# Patient Record
Sex: Female | Born: 1950 | Race: White | Hispanic: No | Smoking: Never smoker
Health system: Southern US, Community
[De-identification: ages and names within clinical notes are randomized; demographics above are authoritative.]

## PROBLEM LIST (undated history)

## (undated) DIAGNOSIS — I00 Rheumatic fever without heart involvement: Secondary | ICD-10-CM

## (undated) DIAGNOSIS — K589 Irritable bowel syndrome without diarrhea: Secondary | ICD-10-CM

## (undated) DIAGNOSIS — R5382 Chronic fatigue, unspecified: Secondary | ICD-10-CM

## (undated) DIAGNOSIS — F32A Depression, unspecified: Secondary | ICD-10-CM

## (undated) DIAGNOSIS — IMO0002 Reserved for concepts with insufficient information to code with codable children: Secondary | ICD-10-CM

## (undated) DIAGNOSIS — F329 Major depressive disorder, single episode, unspecified: Secondary | ICD-10-CM

## (undated) DIAGNOSIS — M1990 Unspecified osteoarthritis, unspecified site: Secondary | ICD-10-CM

## (undated) DIAGNOSIS — N3010 Interstitial cystitis (chronic) without hematuria: Secondary | ICD-10-CM

## (undated) DIAGNOSIS — M797 Fibromyalgia: Secondary | ICD-10-CM

## (undated) DIAGNOSIS — K219 Gastro-esophageal reflux disease without esophagitis: Secondary | ICD-10-CM

## (undated) DIAGNOSIS — I10 Essential (primary) hypertension: Secondary | ICD-10-CM

## (undated) DIAGNOSIS — R42 Dizziness and giddiness: Secondary | ICD-10-CM

## (undated) HISTORY — PX: CHOLECYSTECTOMY: SHX55

## (undated) HISTORY — PX: ABDOMINAL HYSTERECTOMY: SHX81

## (undated) HISTORY — PX: KNEE ARTHROPLASTY: SHX992

## (undated) HISTORY — DX: Major depressive disorder, single episode, unspecified: F32.9

## (undated) HISTORY — PX: OOPHORECTOMY: SHX86

## (undated) HISTORY — DX: Depression, unspecified: F32.A

## (undated) HISTORY — PX: CARPAL TUNNEL RELEASE: SHX101

## (undated) HISTORY — PX: WRIST ARTHROPLASTY: SHX1088

---

## 1997-10-31 ENCOUNTER — Ambulatory Visit (HOSPITAL_COMMUNITY): Admission: RE | Admit: 1997-10-31 | Discharge: 1997-10-31 | Payer: Self-pay | Admitting: Gastroenterology

## 1999-01-06 ENCOUNTER — Other Ambulatory Visit: Admission: RE | Admit: 1999-01-06 | Discharge: 1999-01-06 | Payer: Self-pay | Admitting: Obstetrics & Gynecology

## 1999-12-02 ENCOUNTER — Ambulatory Visit (HOSPITAL_COMMUNITY): Admission: RE | Admit: 1999-12-02 | Discharge: 1999-12-02 | Payer: Self-pay | Admitting: Gastroenterology

## 2000-10-27 ENCOUNTER — Emergency Department (HOSPITAL_COMMUNITY): Admission: EM | Admit: 2000-10-27 | Discharge: 2000-10-27 | Payer: Self-pay | Admitting: Emergency Medicine

## 2000-10-31 ENCOUNTER — Emergency Department (HOSPITAL_COMMUNITY): Admission: EM | Admit: 2000-10-31 | Discharge: 2000-10-31 | Payer: Self-pay | Admitting: Emergency Medicine

## 2000-10-31 ENCOUNTER — Encounter: Payer: Self-pay | Admitting: Emergency Medicine

## 2000-11-05 ENCOUNTER — Emergency Department (HOSPITAL_COMMUNITY): Admission: EM | Admit: 2000-11-05 | Discharge: 2000-11-05 | Payer: Self-pay | Admitting: Emergency Medicine

## 2000-12-06 ENCOUNTER — Emergency Department (HOSPITAL_COMMUNITY): Admission: EM | Admit: 2000-12-06 | Discharge: 2000-12-06 | Payer: Self-pay | Admitting: Emergency Medicine

## 2000-12-08 ENCOUNTER — Ambulatory Visit (HOSPITAL_COMMUNITY): Admission: RE | Admit: 2000-12-08 | Discharge: 2000-12-08 | Payer: Self-pay | Admitting: Internal Medicine

## 2000-12-12 ENCOUNTER — Encounter: Admission: RE | Admit: 2000-12-12 | Discharge: 2001-03-12 | Payer: Self-pay | Admitting: Internal Medicine

## 2001-06-03 ENCOUNTER — Emergency Department (HOSPITAL_COMMUNITY): Admission: EM | Admit: 2001-06-03 | Discharge: 2001-06-03 | Payer: Self-pay | Admitting: Emergency Medicine

## 2001-06-03 ENCOUNTER — Encounter: Payer: Self-pay | Admitting: Family Medicine

## 2001-07-11 ENCOUNTER — Encounter: Payer: Self-pay | Admitting: Obstetrics and Gynecology

## 2001-07-11 ENCOUNTER — Ambulatory Visit (HOSPITAL_COMMUNITY): Admission: RE | Admit: 2001-07-11 | Discharge: 2001-07-11 | Payer: Self-pay | Admitting: Obstetrics and Gynecology

## 2001-10-30 ENCOUNTER — Ambulatory Visit (HOSPITAL_COMMUNITY): Admission: RE | Admit: 2001-10-30 | Discharge: 2001-10-30 | Payer: Self-pay | Admitting: Cardiology

## 2001-12-25 ENCOUNTER — Ambulatory Visit (HOSPITAL_COMMUNITY): Admission: RE | Admit: 2001-12-25 | Discharge: 2001-12-25 | Payer: Self-pay | Admitting: Neurology

## 2001-12-25 ENCOUNTER — Encounter: Payer: Self-pay | Admitting: Neurology

## 2002-01-02 ENCOUNTER — Encounter (HOSPITAL_COMMUNITY): Admission: RE | Admit: 2002-01-02 | Discharge: 2002-02-01 | Payer: Self-pay | Admitting: Neurology

## 2002-01-18 ENCOUNTER — Ambulatory Visit (HOSPITAL_COMMUNITY): Admission: RE | Admit: 2002-01-18 | Discharge: 2002-01-18 | Payer: Self-pay | Admitting: Orthopedic Surgery

## 2002-01-18 ENCOUNTER — Encounter: Payer: Self-pay | Admitting: Orthopedic Surgery

## 2002-01-22 ENCOUNTER — Encounter: Payer: Self-pay | Admitting: Orthopedic Surgery

## 2002-01-22 ENCOUNTER — Ambulatory Visit (HOSPITAL_COMMUNITY): Admission: RE | Admit: 2002-01-22 | Discharge: 2002-01-22 | Payer: Self-pay | Admitting: Orthopedic Surgery

## 2002-03-29 ENCOUNTER — Encounter (INDEPENDENT_AMBULATORY_CARE_PROVIDER_SITE_OTHER): Payer: Self-pay | Admitting: Internal Medicine

## 2002-03-29 ENCOUNTER — Ambulatory Visit (HOSPITAL_COMMUNITY): Admission: RE | Admit: 2002-03-29 | Discharge: 2002-03-29 | Payer: Self-pay | Admitting: Internal Medicine

## 2002-04-07 ENCOUNTER — Emergency Department (HOSPITAL_COMMUNITY): Admission: EM | Admit: 2002-04-07 | Discharge: 2002-04-07 | Payer: Self-pay | Admitting: Emergency Medicine

## 2002-07-25 ENCOUNTER — Encounter (INDEPENDENT_AMBULATORY_CARE_PROVIDER_SITE_OTHER): Payer: Self-pay | Admitting: Internal Medicine

## 2003-07-01 ENCOUNTER — Ambulatory Visit (HOSPITAL_COMMUNITY): Admission: RE | Admit: 2003-07-01 | Discharge: 2003-07-01 | Payer: Self-pay | Admitting: Otolaryngology

## 2003-09-03 ENCOUNTER — Inpatient Hospital Stay (HOSPITAL_COMMUNITY): Admission: EM | Admit: 2003-09-03 | Discharge: 2003-09-04 | Payer: Self-pay | Admitting: Psychiatry

## 2004-08-23 ENCOUNTER — Ambulatory Visit: Payer: Self-pay | Admitting: Gastroenterology

## 2004-08-23 ENCOUNTER — Inpatient Hospital Stay (HOSPITAL_COMMUNITY): Admission: EM | Admit: 2004-08-23 | Discharge: 2004-08-26 | Payer: Self-pay | Admitting: Emergency Medicine

## 2005-08-24 ENCOUNTER — Ambulatory Visit: Payer: Self-pay | Admitting: Internal Medicine

## 2005-09-15 ENCOUNTER — Ambulatory Visit: Payer: Self-pay | Admitting: Internal Medicine

## 2005-09-23 ENCOUNTER — Ambulatory Visit: Payer: Self-pay | Admitting: Family Medicine

## 2005-10-05 ENCOUNTER — Ambulatory Visit: Payer: Self-pay | Admitting: Family Medicine

## 2005-10-05 LAB — CONVERTED CEMR LAB
RBC count: 4.86 10*6/uL
TSH: 0.541 microintl units/mL
WBC, blood: 9.8 10*3/uL

## 2005-12-20 ENCOUNTER — Emergency Department (HOSPITAL_COMMUNITY): Admission: EM | Admit: 2005-12-20 | Discharge: 2005-12-20 | Payer: Self-pay | Admitting: Emergency Medicine

## 2006-01-09 ENCOUNTER — Observation Stay (HOSPITAL_COMMUNITY): Admission: EM | Admit: 2006-01-09 | Discharge: 2006-01-10 | Payer: Self-pay | Admitting: Emergency Medicine

## 2006-01-11 ENCOUNTER — Ambulatory Visit: Payer: Self-pay | Admitting: Internal Medicine

## 2006-01-19 ENCOUNTER — Ambulatory Visit: Payer: Self-pay | Admitting: Internal Medicine

## 2006-03-03 ENCOUNTER — Ambulatory Visit: Payer: Self-pay | Admitting: Internal Medicine

## 2006-05-17 ENCOUNTER — Ambulatory Visit (HOSPITAL_COMMUNITY): Admission: RE | Admit: 2006-05-17 | Discharge: 2006-05-17 | Payer: Self-pay | Admitting: Family Medicine

## 2006-08-03 ENCOUNTER — Emergency Department (HOSPITAL_COMMUNITY): Admission: EM | Admit: 2006-08-03 | Discharge: 2006-08-03 | Payer: Self-pay | Admitting: Emergency Medicine

## 2006-08-22 ENCOUNTER — Telehealth (INDEPENDENT_AMBULATORY_CARE_PROVIDER_SITE_OTHER): Payer: Self-pay | Admitting: Family Medicine

## 2006-08-22 ENCOUNTER — Ambulatory Visit: Payer: Self-pay | Admitting: Family Medicine

## 2006-08-22 ENCOUNTER — Encounter: Payer: Self-pay | Admitting: Family Medicine

## 2006-08-22 DIAGNOSIS — K589 Irritable bowel syndrome without diarrhea: Secondary | ICD-10-CM | POA: Insufficient documentation

## 2006-08-22 DIAGNOSIS — K5900 Constipation, unspecified: Secondary | ICD-10-CM | POA: Insufficient documentation

## 2006-08-22 DIAGNOSIS — Z860100 Personal history of colon polyps, unspecified: Secondary | ICD-10-CM | POA: Insufficient documentation

## 2006-08-22 DIAGNOSIS — G609 Hereditary and idiopathic neuropathy, unspecified: Secondary | ICD-10-CM | POA: Insufficient documentation

## 2006-08-22 DIAGNOSIS — I10 Essential (primary) hypertension: Secondary | ICD-10-CM | POA: Insufficient documentation

## 2006-08-22 DIAGNOSIS — F329 Major depressive disorder, single episode, unspecified: Secondary | ICD-10-CM

## 2006-08-22 DIAGNOSIS — IMO0001 Reserved for inherently not codable concepts without codable children: Secondary | ICD-10-CM | POA: Insufficient documentation

## 2006-08-22 DIAGNOSIS — N318 Other neuromuscular dysfunction of bladder: Secondary | ICD-10-CM | POA: Insufficient documentation

## 2006-08-22 DIAGNOSIS — M1990 Unspecified osteoarthritis, unspecified site: Secondary | ICD-10-CM | POA: Insufficient documentation

## 2006-08-22 DIAGNOSIS — K219 Gastro-esophageal reflux disease without esophagitis: Secondary | ICD-10-CM | POA: Insufficient documentation

## 2006-08-22 DIAGNOSIS — G5600 Carpal tunnel syndrome, unspecified upper limb: Secondary | ICD-10-CM

## 2006-08-22 DIAGNOSIS — G43909 Migraine, unspecified, not intractable, without status migrainosus: Secondary | ICD-10-CM | POA: Insufficient documentation

## 2006-08-22 DIAGNOSIS — F411 Generalized anxiety disorder: Secondary | ICD-10-CM | POA: Insufficient documentation

## 2006-08-22 DIAGNOSIS — M545 Low back pain: Secondary | ICD-10-CM

## 2006-08-22 DIAGNOSIS — N390 Urinary tract infection, site not specified: Secondary | ICD-10-CM

## 2006-09-22 ENCOUNTER — Ambulatory Visit: Payer: Self-pay | Admitting: Family Medicine

## 2006-09-23 ENCOUNTER — Telehealth (INDEPENDENT_AMBULATORY_CARE_PROVIDER_SITE_OTHER): Payer: Self-pay | Admitting: *Deleted

## 2006-09-26 ENCOUNTER — Telehealth (INDEPENDENT_AMBULATORY_CARE_PROVIDER_SITE_OTHER): Payer: Self-pay | Admitting: Family Medicine

## 2006-12-29 ENCOUNTER — Telehealth (INDEPENDENT_AMBULATORY_CARE_PROVIDER_SITE_OTHER): Payer: Self-pay | Admitting: Family Medicine

## 2007-01-03 ENCOUNTER — Ambulatory Visit: Payer: Self-pay | Admitting: Internal Medicine

## 2007-01-04 ENCOUNTER — Telehealth (INDEPENDENT_AMBULATORY_CARE_PROVIDER_SITE_OTHER): Payer: Self-pay | Admitting: *Deleted

## 2007-01-23 ENCOUNTER — Telehealth (INDEPENDENT_AMBULATORY_CARE_PROVIDER_SITE_OTHER): Payer: Self-pay | Admitting: Family Medicine

## 2007-01-24 ENCOUNTER — Ambulatory Visit: Payer: Self-pay | Admitting: Family Medicine

## 2007-01-24 DIAGNOSIS — R5383 Other fatigue: Secondary | ICD-10-CM

## 2007-01-24 DIAGNOSIS — R5381 Other malaise: Secondary | ICD-10-CM

## 2007-01-25 ENCOUNTER — Telehealth (INDEPENDENT_AMBULATORY_CARE_PROVIDER_SITE_OTHER): Payer: Self-pay | Admitting: Family Medicine

## 2007-02-16 ENCOUNTER — Telehealth (INDEPENDENT_AMBULATORY_CARE_PROVIDER_SITE_OTHER): Payer: Self-pay | Admitting: *Deleted

## 2007-02-20 ENCOUNTER — Encounter (INDEPENDENT_AMBULATORY_CARE_PROVIDER_SITE_OTHER): Payer: Self-pay | Admitting: *Deleted

## 2007-02-27 ENCOUNTER — Encounter (INDEPENDENT_AMBULATORY_CARE_PROVIDER_SITE_OTHER): Payer: Self-pay | Admitting: Family Medicine

## 2007-03-07 ENCOUNTER — Encounter (INDEPENDENT_AMBULATORY_CARE_PROVIDER_SITE_OTHER): Payer: Self-pay | Admitting: Family Medicine

## 2007-07-27 ENCOUNTER — Encounter (INDEPENDENT_AMBULATORY_CARE_PROVIDER_SITE_OTHER): Payer: Self-pay | Admitting: Family Medicine

## 2007-11-27 ENCOUNTER — Emergency Department (HOSPITAL_COMMUNITY): Admission: EM | Admit: 2007-11-27 | Discharge: 2007-11-27 | Payer: Self-pay | Admitting: Emergency Medicine

## 2007-12-24 ENCOUNTER — Emergency Department (HOSPITAL_COMMUNITY): Admission: EM | Admit: 2007-12-24 | Discharge: 2007-12-24 | Payer: Self-pay | Admitting: Emergency Medicine

## 2010-07-04 ENCOUNTER — Encounter: Payer: Self-pay | Admitting: Otolaryngology

## 2010-10-30 NOTE — Procedures (Signed)
[facility]  Patient:    Pamela Vasquez, Pamela Vasquez                      MRN: [id number] Proc. Date: 12/02/99 Adm. Date:  [id number] Disc. Date: [id number] Attending:  Charna Elizabeth                           Procedure Report  DATE OF BIRTH:  January 12, 1951.  REFERRING PHYSICIAN:  Dr. Molly Maduro _____.  PROCEDURE PERFORMED:  Colonoscopy.  ENDOSCOPIST:  Anselmo Rod, M.D.  INSTRUMENT USED:  Olympus video colonoscope.  INDICATION FOR PROCEDURE:  Rectal bleeding in a 60 year old white female. Rule out polyps, masses, hemorrhoids, etc.  PREPROCEDURE PREPARATION:  Informed consent was procured from the patient. The patient was fasted for eight hours prior to the procedure, was prepped with a bottle of magnesium citrate and a gallon of NuLytely the night prior to the procedure.  PREPROCEDURE PHYSICAL:  VITAL SIGNS:  The patient had stable vital signs.  NECK:  Supple.  CHEST:  Clear to auscultation.  S1, S2 regular.  ABDOMEN:  Soft with normal abdominal bowel sounds.  DESCRIPTION OF PROCEDURE:  The patient was placed in the left lateral decubitus position and sedated with 2.5 mm of Versed in addition to the Versed she received for the EGD.  Once the patient was adequately sedate, the Olympus video colonoscope was advanced from the rectum to the cecum without difficulties.  Except for a few (about four to five) diverticula noted in the left colon, no other abnormalities were seen.  The patient tolerated the procedure well without complications.  IMPRESSION:  Normal colonoscopy except for few left-sided diverticula.  RECOMMENDATIONS: 1. The patient has been advised to increase the fluid and fiber in her diet. 2. A small bowel follow-through will be done to further work up her symptoms. 3. Outpatient follow-up is advised after the small bowel study. DD:  12/02/99 TD:  12/05/99 Job: [id number]

## 2010-10-30 NOTE — H&P (Signed)
Pamela Vasquez, Pamela Vasquez               ACCOUNT NO.:  [id number]   MEDICAL RECORD NO.:  [id number]          PATIENT TYPE:  INP   LOCATION:  A332                          FACILITY:  [facility]   PHYSICIAN:  Patrica Duel, M.D.    DATE OF BIRTH:  02/12/1951   DATE OF ADMISSION:  08/23/2004  DATE OF DISCHARGE:  LH                                HISTORY & PHYSICAL   CHIEF COMPLAINT:  Bloody diarrhea.   HISTORY OF PRESENT ILLNESS:  This is a 60 year old female with a history of  severe fibromyalgia and depression.  She also has a history of irritable  bowel syndrome and diverticulosis as well as colitis.  She is status post  cholecystectomy, hysterectomy and appendectomy.  She was admitted to the  [facility] approximately 10 months ago for an  overnight stay with a discharge diagnosis of depressive disorder.  She has  been intolerant to most medications prescribed to her.   The patient presented to the emergency department with a two to three week  history of increasingly severe left lower-quadrant abdominal pain and acute  onset of bloody mucoid diarrhea.  Her abdominal pain is currently more of a  cramping quality.  She has had no fevers, chills, nausea, or vomiting noted.  She also denies headache, neurologic deficits, chest pain, shortness of  breath, or genitourinary symptoms.  She does report increasingly severe  urinary retention since being admitted to the hospital.   In the emergency department, routine laboratory was unrevealing except for a  white count of 12,000.  H&H normal.  Potassium 3.4, renal functions as well  as urinalysis normal.   CT was ordered.  However, the patient refused the oral contrast due to  intolerance.   The patient is admitted with the acute onset of bloody mucoid diarrheal  stools after a two or three week history of vague left lower-quadrant  abdominal pain.   CURRENT MEDICATIONS:  1.  Lorazepam 1 mg q.h.s.  2.  Xanax 0.5 p.r.n.  3.  Prilosec 20 mg daily.  4.  Estrogen 0.3 mg daily.   PAST MEDICAL HISTORY:  As noted above.  She also has longstanding  interstitial cystitis and has been intolerant to bladder irrigations.  She  has been followed by [facility] urologic services.   ALLERGIES:  Anti-inflammatories.   SOCIAL HISTORY:  Nonsmoker, nondrinker, has a supportive husband.   REVIEW OF SYSTEMS:  Negative except as mentioned.   PHYSICAL EXAMINATION:  GENERAL:  This is an obviously-depressed, somewhat  pale female who is alert, oriented and in no acute distress.  She appears  well hydrated.  VITAL SIGNS:  Temperature 99 degrees, BP 138/75.  Pulse 78.  Respirations  20.  HEENT:  Normocephalic, atraumatic.  Pupils are equal.  Ears, nose, throat  are benign.  Mucous membranes are moist.  NECK:  Supple, no bruits, thyromegaly or lymphadenopathy.  LUNGS:  Clear.  HEART:  Sounds are perfectly normal.  ABDOMEN:  Flat and nontympanitic.  Bowel sounds are somewhat diminished.  She has left lower-quadrant tenderness which is somewhat inconsistent.  She  has no significant guarding or rebound.  Bowel sounds as noted.  NEUROLOGIC:  Exam is nonfocal.   ASSESSMENT:  Acute onset diarrhea, questionable etiology.  Of note, the  patient has been on antibiotics the last being in January of this year  (Levaquin).  Consider acute colitis, secondary to Clostridium difficile or  inflammatory bowel disease as well as viral syndrome.   PLAN:  Admit for hydration, symptom control, stools C&S, O&P and cultures.  I will ask GI to see the patient.  A CT scan again has been refused, and we  will attempt to manage her without this empirically pending her  reconsideration regarding this matter.  We will follow with you expectantly.      MC/MEDQ  D:  08/23/2004  T:  08/23/2004  Job:  [id number]

## 2010-10-30 NOTE — H&P (Signed)
NAMEMERRANDA, BOLLS               ACCOUNT NO.:  [id number]   MEDICAL RECORD NO.:  [id number]          PATIENT TYPE:  INP   LOCATION:  A319                          FACILITY:  [facility]   PHYSICIAN:  Patrica Duel, M.D.    DATE OF BIRTH:  06-10-1951   DATE OF ADMISSION:  01/09/2006  DATE OF DISCHARGE:  LH                                HISTORY & PHYSICAL   COMBINED HISTORY AND PHYSICAL/DISCHARGE NOTE   CHIEF COMPLAINT:  Abdominal pain.   HISTORY OF PRESENT ILLNESS:  This is a 60 year old female with a history of  severe depression and chronic debility secondary to fibromyalgia.  She says  she stays in bed approximately 75% of the time.  She also has a history of  recurring abdominal pain and documented irritable bowel syndrome as well as  diverticulosis.  She has undergone colonoscopy x2, the last being in 2001.  She was admitted in March 2006 after having had an episode of bloody  diarrhea with benign workup.  She is status post cholecystectomy,  hysterectomy and appendectomy and has chronic fatigue syndrome.   The patient presented to the emergency department approximately 2 weeks ago  with abdominal pain.  Workup was benign.  Chemistries and CT scan were  obtained.  She was treated symptomatically and discharged.   The patient developed left lower quadrant abdominal pain and non-bloody  diarrhea 24 hours prior to this admission.  This had resolved.  Her  abdominal pain continued.  She was brought to the emergency department for  evaluation.  The workup again was  essentially benign.  The CT scan revealed  diverticulosis and non-obstructing renal stones, otherwise no evidence of  inflammation or diverticulitis.  White count normal.  She insisted on  admission because she was just too weak to go home.   There is no history of headache, neurologic deficits, chest pain, shortness  of breath, cough, fever, chills, nausea, vomiting, melena, hematemesis,  hematochezia, genitourinary  symptoms.   ALLERGIES:  ANTI-INFLAMMATORIES, DEMEROL AND CODEINE.   CURRENT MEDICATIONS:  1. Tylenol p.r.n.  2. Prilosec p.r.n.  3. Ativan and Xanax p.r.n.  4. Estrogen patch.   PAST HISTORY:  Noted.  There is a question of interstitial cystitis though  currently she is asymptomatic.   SOCIAL HISTORY:  Nonsmoker, nondrinker.  No history of drug abuse.   FAMILY HISTORY:  Significant for coronary artery disease and hypertension.   REVIEW OF SYMPTOMS:  Negative except as mentioned.   PHYSICAL EXAMINATION:  GENERAL:  This is a pleasant soft-spoken and  outwardly depressed female, who is in no acute distress whatsoever.  VITAL SIGNS:  Normal.  She is afebrile.  HEENT:  Normocephalic and atraumatic.  Pupils are equal.  Ears, nose and  throat are benign.  NECK:  Supple without masses, thyromegaly, lymphadenopathy or bruits.  LUNGS:  Clear to A&P.  HEART:  Heart sounds are normal.  ABDOMEN:  Nontender, nondistended.  Bowel sounds are intact.  There is no  discrete tenderness.  No mass is noted.  EXTREMITIES:  No clubbing, cyanosis or edema.  NEUROLOGIC EXAM:  Nonfocal.  ADMITTING DIAGNOSIS:  Abdominal pain probably related to irritable bowel  syndrome.   COURSE IN THE HOSPITAL:  The patient was hydrated and treated  symptomatically.  At this time, she is asymptomatic and requesting a regular  diet and  discharged home to be worked up as an outpatient.  GI consultation  was offered though this is declined at this time.  Her physical exam is  completely benign.  Her vital signs remain stable and she is okay for  discharge from my perspective.   DISPOSITION:  Medications will include:  1. Xanax 0.5 mg t.i.d. p.r.n.  2. Prilosec 20 mg daily.  3. Levsin p.r.n.   DIET:  As tolerated.   An appointment with Dr. Karilyn Cota or Dr. Jena Gauss will be scheduled.  She will be  followed and treated expectantly as an outpatient.      Patrica Duel, M.D.  Electronically Signed     MC/MEDQ   D:  01/10/2006  T:  01/10/2006  Job:  [id number]

## 2010-10-30 NOTE — Discharge Summary (Signed)
NAME:  Pamela Vasquez, Pamela Vasquez                         ACCOUNT NO.:  [id number]   MEDICAL RECORD NO.:  [id number]                   PATIENT TYPE:  IPS   LOCATION:  0303                                 FACILITY:  [facility]   PHYSICIAN:  Jeanice Lim, M.D.              DATE OF BIRTH:  1950/11/06   DATE OF ADMISSION:  09/03/2003  DATE OF DISCHARGE:  09/04/2003                                 DISCHARGE SUMMARY   IDENTIFYING DATA:  This is a 60 year old married female sent here for  evaluation by her primary care physician, Dr. Timoteo Expose, in [place].  The  patient reported that she thought she was coming to a pain clinic.  Felt  that she was tricked in coming here, although did admit reporting that she  felt that she did not want to wake up because of the pain and felt that she  could not keep going on because of the pain.   MEDICATIONS:  Currently, Prilosec, Tylenol, Xanax and Ativan.   ALLERGIES:  No known drug allergies.   PHYSICAL EXAMINATION:  Within normal limits.  Neurologically nonfocal.   LABORATORY DATA:  Routine admission labs essentially within normal limits.   MENTAL STATUS EXAM:  Alert and oriented x3.  Well-groomed.  Appropriately  dressed.  Speech within normal limits.  Mood anxious.  Affect congruent.  Thought processes goal directed.  Thought content negative for dangerous  ideation.  No psychotic symptoms.  The patient reported that stress appeared  to be worse in the objective findings.  Judgment and insight were fair to  poor.  Cognition intact.  The patient endorsed some depressive symptoms and  fearfulness of the medications.   ADMISSION DIAGNOSES:   AXIS I:  Depressive disorder.   AXIS II:  Deferred.   AXIS III:  1. Irritable bowel syndrome.  2. Fibromyalgia.  3. Chronic fatigue syndrome.   AXIS IV:  Moderate (limited support system and chronic pain).   AXIS V:  25/55.   HOSPITAL COURSE:  The patient was admitted and ordered routine p.r.n.  medications and  underwent further monitoring.  Was encouraged to participate  in individual, group and milieu therapy.  Was encouraged to participate in  individual, group and milieu therapy.  The patient reported that her husband  and she felt that it was a great mistake for her to come here.  They both  had believed it was a pain clinic and that both agreed it was not a good fit  for her condition.  The patient was seen the following day, reported no  suicidal or homicidal ideation, reported having a pain syndrome, which was  severe, and that she had said she could not keep living with the pain.  However, the intent was to get treatment for the pain, not kill herself.  No  history of suicide attempts nor any current suicidal ideation.  Mild  depressive symptoms.  Difficulty sleeping.  Anxiety.  The patient was to  continue medications Flexeril, Ambien, Neurontin, Celexa low dose 5 mg due  to past sensitivity, and Ativan and to follow up with pain referral.   CONDITION ON DISCHARGE:  The patient was discharged in improved condition.  Mood mostly euthymic.  Affect full.  Thought processes goal directed.  Thought content negative for dangerous ideation or psychotic symptoms.  The  patient was given medication education and discharged to continue previous  medications prescribed in addition to:   DISCHARGE MEDICATIONS:  1. Celexa 10 mg, 1/2 q.a.m.  2. Ambien 10 mg q.h.s. p.r.n.  3. Ativan 0.5 mg, 1/2 q.a.m., 1/2 at 4 p.m. and 3 q.h.s.  4. Flexeril 10 mg, 1/2-1 q.h.s.  5. Ultram 50 mg, 2 q.h.s.   FOLLOWUP:  The patient is to follow up with Dr. Andee Poles and for the  pain rehab medicine unit.   DISCHARGE DIAGNOSES:   AXIS I:  Depressive disorder.   AXIS II:  Deferred.   AXIS III:  1. Irritable bowel syndrome.  2. Fibromyalgia.  3. Chronic fatigue syndrome.   AXIS IV:  Moderate (limited support system and chronic pain).   AXIS V:  Global Assessment of Functioning on discharge 55.                                                Jeanice Lim, M.D.    JEM/MEDQ  D:  10/23/2003  T:  10/24/2003  Job:  [id number]

## 2010-10-30 NOTE — Procedures (Signed)
[facility]  Patient:    Pamela Vasquez, Pamela Vasquez Visit Number: [id number] MRN: [id number]          Service Type: OUT Location: RAD Attending Physician:  Cain Sieve Dictated by:   Gerrit Friends. Dietrich Pates, M.D. [facility] Proc. Date: 10/30/01 Admit Date:  10/30/2001 Discharge Date: 10/30/2001                              Echocardiograms  REFERRING PHYSICIAN:  Dr. Phillips Odor and Dr. Daleen Squibb  CLINICAL DATA:  A 60 year old woman with chest pain, history of rheumatic fever.  1. Technically adequate echocardiographic study. 2. Normal left atrium, right atrium, and right ventricle. 3. Minimal thickening of the aortic valve leaflets with normal function. 4. Normal mitral and tricuspid valves. 5. Normal Doppler examination with trivial mitral and tricuspid regurgitation. 6. Normal internal dimension, wall thickness, regional and global function of    the left ventricle. 7. Normal IVC. Dictated by:   Gerrit Friends. Dietrich Pates, M.D. [facility] Attending Physician:  Cain Sieve DD:  10/30/01 TD:  11/01/01 Job: [id number]

## 2010-10-30 NOTE — H&P (Signed)
NAME:  Pamela Vasquez, Pamela Vasquez                         ACCOUNT NO.:  [id number]   MEDICAL RECORD NO.:  [id number]                   PATIENT TYPE:  IPS   LOCATION:  0303                                 FACILITY:  [facility]   PHYSICIAN:  Geoffery Lyons, M.D.                   DATE OF BIRTH:  [birth year]   DATE OF ADMISSION:  09/03/2003  DATE OF DISCHARGE:                         PSYCHIATRIC ADMISSION ASSESSMENT   IDENTIFYING INFORMATION:  This is a voluntary admission.  This is a 60-year-  old white married female, who was sent here for evaluation by her primary  care physician, Dr. Timoteo Expose, in [place].  The patient immediately begins  the interview by saying that she thought she was coming to a pain clinic.  He did not realize that this was a psychiatric ward and she feels that she  signed in mistakenly.  She was advised that, in the morning, she could take  this up with the attending physician and address being discharged or she  might change her mind and want to stay and try medication.  The patient does  acknowledge feeling helpless, hopeless, having increased pain over the last  3-4 years.  She does not want to wake up because of the pain, although she  denies frank suicidal ideation.  She states she would never kill herself.  Apparently, she is not going to her appointment.  She is not taking care of  her basic needs, not making her own meals.  She states she has chronic  fatigue as well as fibromyalgia.  Primary care physician has tried numerous  pain medications and antidepressants.  The patient states severe  sensitivities to all medications.  The primary care physician fears trying  any other medications.   PAST PSYCHIATRIC HISTORY:  She has none.   SOCIAL HISTORY:  She is a high Garment/textile technologist.  She is married 32 years.  She has a son, age 44, a daughter, age 82.  She worked for 27 years.  She  became disabled in October of 1999.  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   FAMILY HISTORY:  She states her mother has similar problems and she has  three first cousins, all whom have fibromyalgia.   ALCOHOL/DRUG HISTORY:  She denies any use of substances.   PRIMARY CARE PHYSICIAN:  Dr. Awanda Mink in [place].   MEDICAL PROBLEMS:  Fibromyalgia, reflux, irritable bowel syndrome and  chronic fatigue.   MEDICATIONS:  Her currently prescribed medications are Prilosec 20 mg p.o.  q.d., Tylenol (she takes the 500 mg extra-strength Tylenol, cuts them in  half and takes 250 mg t.i.d.), Xanax (she has a 0.25 mg that she cuts in  half and then she takes about 1/8 of the half once a day), Ativan 0.5 mg  q.h.s.   ALLERGIES:  No known drug allergies.  She claims  she is exquisitely  sensitive to all medications.  They hit her like a truck.  She has not  actually taken a full dose of anything that I can ascertain other than her  Prilosec.   POSITIVE PHYSICAL FINDINGS:  She is status post a laparocholecystotomy,  total abdominal hysterectomy.  She has had bilateral knee arthroscopies, two  times on both knees.  The remainder of her physical examination revealed a  well-developed, well-developed white female, who is not as feeble as she  purports.  HEENT:  Within normal limits.  LUNGS:  Clear.  HEART:  Regular rate and rhythm without murmurs, rubs, or gallops.  ABDOMEN:  Soft with no mass, megaly or tenderness.  MUSCULOSKELETAL:  Does not reveal any clubbing, cyanosis, or edema.  NEUROLOGIC:  Cranial nerves 2-12 are grossly intact.   MENTAL STATUS EXAM:  She is alert and oriented x 3.  She is well-groomed.  She is appropriately dressed.  Her speech is soft with normal rate, rhythm  and tone.  Her mood is anxious.  Her affect is congruent.  Her thought  processes are clear, rational and goal-oriented.  She would like to be  discharged.  She has no paranoia elicited.  Her pain could, in fact, be  delusional.  Her purported discomfort  versus her presentation  do not match.  Her concentration and memory are intact.  Her judgment and insight are  intact.  Her intelligence is at least average.  She denies suicidal or  homicidal ideation.  She does acknowledge that she is probably depressed but  she is fearful of medication.   DIAGNOSES:   AXIS I:  Depressive disorder.   AXIS II:  Deferred.   AXIS III:  1. Irritable bowel syndrome.  2. Fibromyalgia.  3. Chronic fatigue syndrome.   AXIS IV:  Moderate.   AXIS V:  25.   PLAN:  She will be admitted for safety.  She refuses medication tonight.  This will be addressed in the morning and her normal p.m. medications will  be administered (Ativan 0.5 mg and Tylenol 250 mg).     Mickie Leonarda Salon, P.A.-C.               Geoffery Lyons, M.D.    MD/MEDQ  D:  09/03/2003  T:  09/04/2003  Job:  [id number]

## 2010-10-30 NOTE — Consult Note (Signed)
NAMEGRISELLE, RUFER               ACCOUNT NO.:  [id number]   MEDICAL RECORD NO.:  [id number]          PATIENT TYPE:  INP   LOCATION:  A332                          FACILITY:  [facility]   PHYSICIAN:  Stana Bunting, M.D.  DATE OF BIRTH:  1950-09-18   DATE OF CONSULTATION:  08/23/2004  DATE OF DISCHARGE:                                   CONSULTATION   We are asked to see Pamela Vasquez in consultation by Dr. Patrica Duel for  evaluation of diarrhea and abdominal pain.   HISTORY OF PRESENT ILLNESS:  Ms. Overdorf is a very pleasant 60 year old white  female with past medical history significant for fibromyalgia and chronic  fatigue syndrome as well as irritable bowel syndrome. She notes she was in  her usual state of health until last time at which time she began having  problems with severe diarrhea. She describes loose to watery stools which  were initially nonbloody. After some time, however, she began noticing scant  amount of blood with wiping. Throughout this time, she had had problems with  left lower quadrant abdominal pain which has been going on for several  months. At home, she has not had any nausea, vomiting, or fever or chills.  Of note prior to yesterday, she was feeling well, and she denies any sick  contacts at home. She denies any recent travel or antibiotic use. She drinks  bottled water. She reports that she was eating well yesterday and did not  have any difficulty. Prior to yesterday, she had been moving her bowels  regularly but does note that she requires Citrucel. As her symptoms  persisted overnight, she did decide to come into the emergency department  this morning. In the emergency department, she was found to be  hemodynamically stable. Blood work was done which was essentially normal  with the exception of a mild leukocytosis at 12.9 with 91% segs. A BMP was  normal, although her glucose was slightly elevated at 126. Liver function  studies were all done and were  normal. A lipase was checked and was normal  at 24. A urinalysis did not show any evidence of a high specific gravity,  and there were no ketones. There was no evidence of a urinary tract  infection. The patient was treated with Dilaudid as well as antiemetics, and  after that, has had some problems with vomiting. She otherwise is without  complaint.   In evaluation in Pamela Vasquez, I had reviewed the electronic medical record  here. It does show that she has had a long history of abdominal complaints  and actually underwent an upper endoscopy by Dr. Loreta Ave in June 2001. The  upper endoscopy was normal except for a small hiatal hernia. At that same  time, she underwent a colonoscopy to the cecum which showed a few left sided  diverticula. After that, she underwent a small-bowel follow through in  October 2003 which was normal. I do not see where she has undergone any  recent imaging.   PAST MEDICAL HISTORY:  1.  Fibromyalgia.  2.  Chronic fatigue syndrome.  She denies any history of diabetes, asthma, hypertension, or coronary artery  disease. She does note that in the past she underwent a cardiac  catheterization but reports this was negative. I, however, do not have any  results of this.   PAST SURGICAL HISTORY:  1.  Significant for cholecystectomy in January of 1999 which was done      secondary to biliary dyskinesia. She reports no stones were found.  2.  She is status post a hysterectomy and oophorectomy.  3.  She is also status post knee surgeries.   MEDICATIONS AT HOME:  1.  Xanax p.r.n.  2.  Ativan 0.5 mg q.h.s.  3.  Tylenol p.r.n.  4.  Prilosec 1/2 tablet daily.  5.  Vivelle hormone patch.   SOCIAL HISTORY:  The patient is married and lives in [place], [place].  She has two children, ages 48 and 26. She is on disability since 1999  secondary to her fibromyalgia. She previously worked as a Haematologist. She  does not use alcohol or tobacco. She notes she is  essentially bedridden now  and is in bed the vast majority of the day.   FAMILY HISTORY:  Positive for coronary artery disease in her mother. She  also reports that her mom has hepatitis C. Her father had colon polyps in  his 36s and evidently had a polyp removed by surgery several years ago.   REVIEW OF SYSTEMS:  A complete review of systems was performed and was  negative except as noted in the history of present illness.   PHYSICAL EXAMINATION:  VITAL SIGNS:  She is afebrile with a temperature of  99.0, pulse 78, respirations 20, blood pressure 138/75.  GENERAL:  This is a white female who is alert and oriented. She is lying in  bed comfortably and does not appear to be in any acute distress.  HEENT:  Atraumatic and normocephalic. Sclerae are anicteric. Oropharynx  clear.  NECK:  Supple.  CARDIOVASCULAR:  Regular rate and rhythm.  CHEST:  Clear to auscultation bilaterally.  ABDOMEN:  Soft. There is no guarding or rebound. I do not appreciate any  organomegaly. There is some mild tenderness in the left lower quadrant, but  this is minimal.  EXTREMITIES:  Warm. There is no edema.  SKIN:  No obvious rashes.  NEUROLOGICAL:  The patient is alert and oriented. Her affect is appropriate.   LABORATORY DATA:  Laboratory data has been reviewed from the emergency  department and is noted in the history of present illness. All of her liver  function studies are normal. White count is slightly elevated at 12.9 with a  left shift of 91% segs. Hemoglobin 14.3. Lipase is normal. Urinalysis is  unrevealing. Previous endoscopic data and radiographic data noted above.   IMPRESSION AND PLAN:  1.  Ms. Altman is a 60 year old white female with multiple chronic medical      problems including fibromyalgia and chronic fatigue syndrome. She now      presents with the acute onset of diarrhea as well as some rather chronic     lower abdominal pain. With respect to her diarrhea, I suspect this is       likely a viral type illness, although it could be an exacerbation of her      irritable bowel syndrome. Certainly, she has no objective evidence of      dehydration at [facility] time. As she has been hospitalized this, we will      check  basic stool studies including gram stain and culture as well as      clostridium difficile toxin, ova parasites, and Giardia antigen. I      suspect the yield of this is low, however. I am doubtful that she has      any inflammatory bowel disease on the basis of her previous examination.      More over, I think her bleeding is likely related to her diarrhea and is      benign in etiology; however, we may need to consider doing a flexible      sigmoidoscopy to further evaluate this while she is here. I will also      check a TSH and sed rate today.  2.  With respect to the patient's chronic abdominal pain, etiology of this      is not clear. It is somewhat difficulty to determine as she does have      multiple somatic complaints related to her fibromyalgia. As she has not      undergone recent imaging, we will get a CT scan of the abdomen and      pelvis today with oral and IV contrast to better evaluate this.  3.  We will follow the patient closely and further evaluation will be made      on the basis of the above findings. She will be continued on IV      hydration.      RSS/MEDQ  D:  08/23/2004  T:  08/24/2004  Job:  [id number]

## 2010-10-30 NOTE — Discharge Summary (Signed)
NAMECANESHA, TESFAYE               ACCOUNT NO.:  [id number]   MEDICAL RECORD NO.:  [id number]          PATIENT TYPE:  INP   LOCATION:  A332                          FACILITY:  [facility]   PHYSICIAN:  Patrica Duel, M.D.    DATE OF BIRTH:  [birth year]   DATE OF ADMISSION:  08/23/2004  DATE OF DISCHARGE:  03/15/2006LH                                 DISCHARGE SUMMARY   DISCHARGE DIAGNOSES:  1.  Bloody diarrhea, questionable etiology, workup benign.  2.  Question perirectal disease/irritable bowel syndrome.  3.  Chronic severe depression with multiple medication sensitivity.  4.  Chronic fatigue syndrome.  5.  Status post cholecystectomy.  6.  Hysterectomy.  7.  Appendectomy.  8.  Fibromyalgia.   HISTORY:  For details regarding admission, please refer to admitting note.  Briefly, this 60 year old female.  This 60 year old female with the above  history presented to the emergency department with a 2-3 week history of  increasingly severe left lower quadrant abdominal pain and acute onset of  bloody mucoid diarrhea.  Her abdominal pain was more of cramping quality.  She had no fever, chills, nausea or vomiting.  She also had no neurological  deficits, chest pain, shortness of breath, or genitourinary symptoms.  She  has had some urinary retention.   Routine workup revealed a white count of 12,000; normal H&H.  Potassium of  3.4.  Renal function and urinalysis normal.  A CT was ordered, however, the  patient has refused oral contrast due to intolerance.   She was admitted with acute onset of bloody, mucoid diarrheal stools with  the above history.   COURSE IN THE HOSPITAL:  The patient was hydrated and treated  symptomatically.  Dr. __________ was consulted.  She did ultimately agree to  CT scan which revealed no acute abnormalities.  CT pelvis revealed sigmoid  diverticulosis with no acute abnormality as well.  She has been hydrated,  potassium supplement and currently she is devoid of  symptoms.  Her diarrhea  has resolved.  She is stable for discharge at this time.   DISCHARGE MEDICATIONS:  The patient is to continue her home medications  which include;  1.  Lorazepam 1 mg q.h.s.  2.  Xanax 0.5 p.r.n.  3.  Prilosec 20 daily.  4.  Estrogen 0.3 daily.  5.  Imodium p.r.n.  6.  Avoidance of dairy products etcetera.   DISPOSITION:  Dr. Karilyn Cota will followup with the patient.  She will be  treated expectantly as an outpatient.  Psychiatry followup has been  suggested (she is seen by Dr. Nolen Mu in [place]).      MC/MEDQ  D:  08/26/2004  T:  08/26/2004  Job:  [id number]

## 2010-10-30 NOTE — Procedures (Signed)
[place]. [facility]  Patient:    Pamela Vasquez, Pamela Vasquez                      MRN: [id number] Proc. Date: 12/02/99 Adm. Date:  [id number] Disc. Date: [id number] Attending:  Charna Elizabeth CC:         Luciana Axe, M.D.                           Procedure Report  DATE OF BIRTH:  March 27, 1948  PROCEDURE:  Esophagogastroduodenoscopy.  ENDOSCOPIST:  Anselmo Rod, M.D.  REFERRING PHYSICIAN:  Luciana Axe, M.D.  INSTRUMENTS USED:  Olympus video panendoscope.  INDICATION FOR PROCEDURE:  Abdominal pain with ______ in a 60 year old white female.  Rule out peptic ulcer disease, esophagitis, gastritis, etc.  PREPROCEDURE PREPARATION:  Informed consent was procured from the patient and the patient fasted for eight hours prior to the procedure.  PREPROCEDURE PHYSICAL EXAMINATION:  VITAL SIGNS:  The patient had stable vital signs except for a blood pressure of 182/108 at the beginning of the procedure.  NECK:  Supple.  CHEST:  Clear to auscultation, S1, S2 regular.  ABDOMEN:  Soft with normal abdominal bowel sounds.  DESCRIPTION OF THE PROCEDURE:  The patient was placed in the left lateral decubitus position, and sedated with 7.5 mg of Versed intravenously.  Once the patient was adequately sedated and maintained on low-flow oxygen and continuous cardiac monitoring, the Olympus video panendoscope was advanced through the mouthpiece, over the tongue, into the esophagus under direct vision.  The entire esophagus appeared normal without evidence of ring, stricture, masses, lesions, esophagitis or Barretts mucosa. The scope was then advanced into the stomach.  There was a ______ appearing gastric mucosa with no evidence of erosions, ulcerations, masses, or polyps except for what appeared to be a small hiatal hernia.  No other abnormalities were seen.  IMPRESSION:  Normal esophagogastroduodenoscopy except for a small hiatal hernia.  RECOMMENDATIONS:  Proceed  with colonoscopy at this time. DD:  12/02/99 TD:  12/05/99 Job: [id number]

## 2010-12-31 ENCOUNTER — Encounter: Payer: Self-pay | Admitting: Emergency Medicine

## 2010-12-31 ENCOUNTER — Emergency Department (HOSPITAL_COMMUNITY)
Admission: EM | Admit: 2010-12-31 | Discharge: 2010-12-31 | Disposition: A | Discharge status: Home / Self Care | Payer: Medicare Other | Attending: Emergency Medicine | Admitting: Emergency Medicine

## 2010-12-31 ENCOUNTER — Emergency Department (HOSPITAL_COMMUNITY): Payer: Medicare Other

## 2010-12-31 DIAGNOSIS — R059 Cough, unspecified: Secondary | ICD-10-CM | POA: Insufficient documentation

## 2010-12-31 DIAGNOSIS — R0602 Shortness of breath: Secondary | ICD-10-CM | POA: Insufficient documentation

## 2010-12-31 DIAGNOSIS — K589 Irritable bowel syndrome without diarrhea: Secondary | ICD-10-CM | POA: Insufficient documentation

## 2010-12-31 DIAGNOSIS — R5381 Other malaise: Secondary | ICD-10-CM | POA: Insufficient documentation

## 2010-12-31 DIAGNOSIS — R5383 Other fatigue: Secondary | ICD-10-CM | POA: Insufficient documentation

## 2010-12-31 DIAGNOSIS — I10 Essential (primary) hypertension: Secondary | ICD-10-CM | POA: Insufficient documentation

## 2010-12-31 DIAGNOSIS — IMO0001 Reserved for inherently not codable concepts without codable children: Secondary | ICD-10-CM | POA: Insufficient documentation

## 2010-12-31 DIAGNOSIS — R093 Abnormal sputum: Secondary | ICD-10-CM | POA: Insufficient documentation

## 2010-12-31 DIAGNOSIS — R05 Cough: Secondary | ICD-10-CM | POA: Insufficient documentation

## 2010-12-31 HISTORY — DX: Chronic fatigue, unspecified: R53.82

## 2010-12-31 HISTORY — DX: Gastro-esophageal reflux disease without esophagitis: K21.9

## 2010-12-31 HISTORY — DX: Unspecified osteoarthritis, unspecified site: M19.90

## 2010-12-31 HISTORY — DX: Reserved for concepts with insufficient information to code with codable children: IMO0002

## 2010-12-31 HISTORY — DX: Interstitial cystitis (chronic) without hematuria: N30.10

## 2010-12-31 HISTORY — DX: Fibromyalgia: M79.7

## 2010-12-31 HISTORY — DX: Essential (primary) hypertension: I10

## 2010-12-31 HISTORY — DX: Irritable bowel syndrome, unspecified: K58.9

## 2010-12-31 HISTORY — DX: Rheumatic fever without heart involvement: I00

## 2010-12-31 MED ORDER — HYDROCOD POLST-CHLORPHEN POLST 10-8 MG/5ML PO LQCR: 5.0000 mL | Freq: Two times a day (BID) | ORAL | Status: DC | PRN

## 2010-12-31 MED ORDER — ALPRAZOLAM 0.25 MG PO TABS: 0.2500 mg | ORAL_TABLET | Freq: Every evening | ORAL | Status: AC | PRN

## 2010-12-31 NOTE — ED Notes (Signed)
Pt reports chronic cough.  States that she feels like her chest is sore and her throat feels swollen from constantly coughing at home.  Lungs were clear.  nad noted

## 2010-12-31 NOTE — ED Provider Notes (Signed)
History     Chief Complaint  Patient presents with  . Shortness of Breath  . Cough   HPI Comments: Pt c/o worsening dry "hacking" cough x 3 days with headache. Has had cough everyday >1 year. Has been using robitussin DM and cough drops x 3 days, robitussin improves cough for a little while. Was seen in ED previously for this cough and states it was improved with an inhaler.  Patient is a 60 y.o. female presenting with cough. The history is provided by the patient.  Cough This is a chronic problem. The problem occurs every few hours. Progression since onset: worsening x 2-3 days. The cough is non-productive. There has been no fever. Associated symptoms include shortness of breath. Pertinent negatives include no chest pain, no chills, no sweats, no headaches and no wheezing. Associated symptoms comments: "sinus drainage" and "airway swelling".    Past Medical History  Diagnosis Date  . Arthritis   . Hypertension   . Fibromyalgia   . Chronic fatigue   . Acid reflux   . Degenerative disc disease   . Irritable bowel syndrome (IBS)   . Interstitial cystitis   . Rheumatic fever     Past Surgical History  Procedure Date  . Knee arthroplasty   . Carpal tunnel release   . Wrist arthroplasty   . Cholecystectomy   . Oophorectomy   . Abdominal hysterectomy     Family History  Problem Relation Age of Onset  . Osteoarthritis Mother   . Cancer Father   . Rheum arthritis Brother   . Osteoarthritis Brother   . Cancer Other     History  Substance Use Topics  . Smoking status: Never Smoker   . Smokeless tobacco: Not on file  . Alcohol Use: No    OB History    Grav Para Term Preterm Abortions TAB SAB Ect Mult Living   0               Review of Systems  Constitutional: Negative for chills and fatigue.  HENT: Positive for congestion. Negative for sinus pressure and ear discharge.   Eyes: Negative for discharge.  Respiratory: Positive for cough and shortness of breath.  Negative for wheezing.   Cardiovascular: Negative for chest pain.  Gastrointestinal: Negative for abdominal pain and diarrhea.  Genitourinary: Negative for frequency and hematuria.  Musculoskeletal: Negative for back pain.  Skin: Negative for rash.  Neurological: Negative for seizures and headaches.  Hematological: Negative.   Psychiatric/Behavioral: Negative for hallucinations.    Physical Exam  BP 165/111  Pulse 108  Temp(Src) 97.7 F (36.5 C) (Oral)  Resp 22  Ht 5\' 4"  (1.626 m)  Wt 144 lb (65.318 kg)  BMI 24.72 kg/m2  SpO2 99%  Physical Exam  Constitutional: She is oriented to person, place, and time. She appears well-developed.  HENT:  Head: Normocephalic and atraumatic.  Nose: Nose normal.  Mouth/Throat: Oropharynx is clear and moist.  Eyes: Conjunctivae and EOM are normal. No scleral icterus.  Neck: Neck supple. No JVD present. No tracheal deviation present. No thyromegaly present.  Cardiovascular: Normal rate and regular rhythm.  Exam reveals no gallop and no friction rub.   No murmur heard. Pulmonary/Chest: No stridor. She has no wheezes. She has no rales. She exhibits no tenderness.  Abdominal: She exhibits no distension. There is no tenderness. There is no rebound.  Musculoskeletal: Normal range of motion. She exhibits no edema.  Lymphadenopathy:    She has no cervical adenopathy.  Neurological:  She is oriented to person, place, and time. Coordination normal.  Skin: No rash noted. No erythema.  Psychiatric: She has a normal mood and affect. Her behavior is normal.    ED Course  Procedures  Dg Chest 2 View  12/31/2010  *RADIOLOGY REPORT*  Clinical Data: Cough.  CHEST - 2 VIEW  Comparison: Chest x-ray 11/27/2007.  Findings: The cardiac silhouette, mediastinal and hilar contours are within normal limits and stable.  The lungs are clear.  The bony thorax is intact.  IMPRESSION: No acute cardiopulmonary findings.  Original Report Authenticated By: P. Loralie Champagne,  M.D.    MDM Results discussed I personally performed the services described in this documentation, which was scribed in my presence. The recorded information has been reviewed and considered.    Written by Enos Fling acting as scribe for Benny Lennert, MD.   Benny Lennert, MD 12/31/10 [phone]

## 2010-12-31 NOTE — ED Notes (Signed)
Pt states she has coughing spells everyday of her life and now her airway is swollen causing her shortness of breath per pt.

## 2011-01-19 ENCOUNTER — Ambulatory Visit: Payer: Self-pay | Admitting: Family Medicine

## 2011-02-05 ENCOUNTER — Encounter: Payer: Self-pay | Admitting: Family Medicine

## 2011-02-05 ENCOUNTER — Ambulatory Visit (INDEPENDENT_AMBULATORY_CARE_PROVIDER_SITE_OTHER): Payer: Medicare Other | Admitting: Family Medicine

## 2011-02-05 VITALS — BP 176/98 | HR 108 | Ht 64.0 in | Wt 152.0 lb

## 2011-02-05 DIAGNOSIS — E663 Overweight: Secondary | ICD-10-CM

## 2011-02-05 DIAGNOSIS — R5381 Other malaise: Secondary | ICD-10-CM

## 2011-02-05 DIAGNOSIS — F3289 Other specified depressive episodes: Secondary | ICD-10-CM

## 2011-02-05 DIAGNOSIS — R5382 Chronic fatigue, unspecified: Secondary | ICD-10-CM

## 2011-02-05 DIAGNOSIS — I10 Essential (primary) hypertension: Secondary | ICD-10-CM

## 2011-02-05 DIAGNOSIS — H01009 Unspecified blepharitis unspecified eye, unspecified eyelid: Secondary | ICD-10-CM

## 2011-02-05 DIAGNOSIS — F329 Major depressive disorder, single episode, unspecified: Secondary | ICD-10-CM

## 2011-02-05 DIAGNOSIS — IMO0001 Reserved for inherently not codable concepts without codable children: Secondary | ICD-10-CM

## 2011-02-05 MED ORDER — ESOMEPRAZOLE MAGNESIUM 40 MG PO CPDR: 40.0000 mg | DELAYED_RELEASE_CAPSULE | Freq: Every day | ORAL | Status: DC

## 2011-02-05 MED ORDER — ALPRAZOLAM 0.25 MG PO TABS: 0.1250 mg | ORAL_TABLET | Freq: Two times a day (BID) | ORAL | Status: DC

## 2011-02-05 MED ORDER — LISINOPRIL 5 MG PO TABS: 5.0000 mg | ORAL_TABLET | Freq: Two times a day (BID) | ORAL | Status: DC

## 2011-02-05 MED ORDER — TOBRAMYCIN 0.3 % OP SOLN: 1.0000 [drp] | OPHTHALMIC | Status: AC

## 2011-02-05 NOTE — Progress Notes (Signed)
Subjective:    Patient ID: Pamela Vasquez, female    DOB: December 09, 1950, 60 y.o.   MRN: [id number]  HPI  patient here to establish care. Medications and history were reviewed. Patient needs refills on medications today. Also she has complaints about her recurrent blepharitis today. Previous PCP was Dr. Joya Gaskins - states she was dismissed because she was unable to make many appointments because of her fatigue. On disasbility - secondary to her chronic fatigue syndrome, Has Home health which she pays for  Lives alone, power bed, power bed , has been on Bed rest for years   Chronic fatigue syndrome- patient has history of chronic fatigue syndrome as well as fibromyalgia and arthritis since 2001. She is now on bed rest and requires a wheelchair for transportation. She has been seen by rheumatology as well as neurology. She has many other medical problems associated with her fatigue syndrome such as irritable bowel syndrome and  interstitial cystitis. She is too fatigued to get out of bed and requires the use a wheelchair. She also requires help with her meals and cleaning around the house. She's been very weary to take medications and has decided not to have any preventative measures done such as colonoscopy or mammogram because of her severe fatigue. She is currently maintained on Xanax which helps her rest. She has difficulty sleeping at times. She states with her fatigue syndrome she cannot take many medications because they worsen this. She presented a note from her previous orthopedics Doctor it notes she has severe fatigue with minimal movement or exertion. She also gets frequent headaches when she is talking a lot. She's been seen by psychiatry for her chronic fatigue as this was thought to be secondary to depression or anxiety however she does not currently take any antidepressants at this time. She was also seen by rheumatology for concern for lupus however was told she did not have this.  Blepharitis-  she has a history of recurrent blepharitis of the right thigh. She currently feels like she has an exacerbation at this time. She has a redness and swelling of the lower eyelid as well as a gritty feeling in the eye. She brought in her previous eye drop which always worse to clear the infection tobramycin. Her vision has not changed. She uses baby wash for the eye as previously directed and occusoft cleanser   Constipation-she's had problems with her bowels and history of IBS. She takes MiraLAX and uses suppositories to have a bowel movement. She was told because she is bed ridden her bowels are very slowly and this is why she is constipation.    HTN- takes 5mg  of lisinopril twice a day, she states her blood pressure always elevates at the clinic  Review of Systems  GEN- +fatigue,denies  fever, weight loss,weakness, recent illness HEENT- + eye drainage, change in vision, nasal discharge, CVS- occ chest pain, palpitations RESP- denies SOB, cough, wheeze ABD- denies N/V, +change in stools- IBS, abd pain GU- denies dysuria, hematuria, dribbling, incontinence MSK- + joint pain, +muscle aches,  No injury Neuro- +headache, no dizziness, syncope, seizure activity      Objective:   Physical Exam GEN- NAD, alert and oriented x3, sitting in wheelchair, fatigued appearing, very slow movements HEENT- PERRL, EOMI, non injected sclera, injected bulbar conjunctiva, MMM, oropharynx clear, mild swelling of right lower eyelid Neck- Supple, no thryomegaly CVS- Tachycardic, no murmur RESP-CTAB EXT- No edema Pulses- Radial, DP- 2+ Psych- depressed appearing, flat affect, normal speech but slow,  not anxious appearing       Assessment & Plan:

## 2011-02-05 NOTE — Patient Instructions (Signed)
Use the antibiotic drop for your eye Continue your current blood pressure medication I have refilled your medications Please get your blood work done- do not eat after midnight- come in fasting Next visit in 6 months

## 2011-02-08 ENCOUNTER — Encounter: Payer: Self-pay | Admitting: Family Medicine

## 2011-02-08 DIAGNOSIS — H01009 Unspecified blepharitis unspecified eye, unspecified eyelid: Secondary | ICD-10-CM | POA: Insufficient documentation

## 2011-02-08 DIAGNOSIS — R5382 Chronic fatigue, unspecified: Secondary | ICD-10-CM | POA: Insufficient documentation

## 2011-02-08 NOTE — Assessment & Plan Note (Signed)
She's not on any medications currently and has been tried on many in the past. She states she does not tolerate antidepressants. She's also not seen a counselor at this time. I will revisit this with her. She is very young and on bed rest.

## 2011-02-08 NOTE — Assessment & Plan Note (Signed)
I discussed with patient because she has high blood pressure she should be seen every 3-6 months. Hopefully she will be able to followup

## 2011-02-08 NOTE — Assessment & Plan Note (Signed)
Will treat with tobramycin drops as this has worked in the past and she is able to tolerate this medication.

## 2011-02-08 NOTE — Assessment & Plan Note (Signed)
Elevated blood pressure at today's visit however have nothing to compare this to. She will continue her lisinopril 5 mg twice a day. Routine labs will be ordered

## 2011-02-08 NOTE — Assessment & Plan Note (Signed)
Patient's chronic fatigue now has her on disability and bed rest. She's been seen by multiple specialists for this. At this time we will continue with her home health care which she is arranged herself. She also appears very depressed.

## 2011-02-27 LAB — LIPID PANEL
HDL: 44 mg/dL (ref >39)
LDL Cholesterol: 236 mg/dL — ABNORMAL HIGH (ref 0–99)
Total CHOL/HDL Ratio: 8 Ratio
Triglycerides: 351 mg/dL — ABNORMAL HIGH (ref <150)
VLDL: 70 mg/dL — ABNORMAL HIGH (ref 0–40)

## 2011-02-27 LAB — COMPREHENSIVE METABOLIC PANEL
ALT: 23 U/L (ref 0–35)
Alkaline Phosphatase: 88 U/L (ref 39–117)
BUN: 15 mg/dL (ref 6–23)
CO2: 20 mEq/L (ref 19–32)
Calcium: 10.1 mg/dL (ref 8.4–10.5)
Chloride: 105 mEq/L (ref 96–112)
Creat: 1 mg/dL (ref 0.50–1.10)
Glucose, Bld: 90 mg/dL (ref 70–99)
Sodium: 140 mEq/L (ref 135–145)
Total Bilirubin: 0.3 mg/dL (ref 0.3–1.2)

## 2011-02-27 LAB — CBC
HCT: 44.4 % (ref 36.0–46.0)
Hemoglobin: 14.2 g/dL (ref 12.0–15.0)
MCH: 30 pg (ref 26.0–34.0)
MCHC: 32 g/dL (ref 30.0–36.0)
MCV: 93.7 fL (ref 78.0–100.0)
Platelets: 311 10*3/uL (ref 150–400)
RBC: 4.74 MIL/uL (ref 3.87–5.11)
RDW: 14.5 % (ref 11.5–15.5)
WBC: 7.8 10*3/uL (ref 4.0–10.5)

## 2011-03-01 ENCOUNTER — Telehealth: Payer: Self-pay | Admitting: Family Medicine

## 2011-03-01 MED ORDER — ATORVASTATIN CALCIUM 40 MG PO TABS: ORAL_TABLET | ORAL | Status: DC

## 2011-03-01 NOTE — Telephone Encounter (Signed)
I called pt and LVM, about her lab results. Her cholesterol is dangerously high, her bad cholesterol (LDL)  is 236, total cholesterol is 351  She needs to be on a cholesterol medication, she is at high risk for heart attack, stroke, worsening blood pressure.  I have sent a statin medication , she should start Lipitor at bedtime, if she has muscle aches or cramps please let me know.

## 2011-03-02 ENCOUNTER — Telehealth: Payer: Self-pay | Admitting: *Deleted

## 2011-03-03 MED ORDER — NIACIN 500 MG PO TABS: ORAL_TABLET | ORAL | Status: DC

## 2011-03-03 NOTE — Telephone Encounter (Signed)
I spoke with patient regarding her lab values, reiterated how high her LDL and TC were. She states this runs heavy in her family and they can not take statins. She did not admit that she has been on a statin, but states with all her other problems she can not tolerate anything that may make her hurt worse. We discussed that alternatives will not bring her Cholesterol into normal range and that statins are the most potent. She is willing to try Niacin over red yeast rice. She will start 500mg  twice a day and let me know if she has any problems  She also stated she ran out of xanax early and can not get until Friday, she was taking 1.5 tablets, which her prescription states 1 tablet twice a day. I told her I will not increase her meds and she will need to take as prescribed. She is dealing with a lot of chronic pain and stress from her children's financial status.

## 2011-03-11 LAB — URINALYSIS, ROUTINE W REFLEX MICROSCOPIC
Bilirubin Urine: NEGATIVE
Hgb urine dipstick: NEGATIVE
Nitrite: NEGATIVE
Specific Gravity, Urine: <1.005 — ABNORMAL LOW
pH: 7

## 2011-03-11 LAB — DIFFERENTIAL
Eosinophils Relative: 1
Lymphocytes Relative: 28
Lymphs Abs: 1.9
Monocytes Absolute: 0.6
Monocytes Relative: 9

## 2011-03-11 LAB — COMPREHENSIVE METABOLIC PANEL
AST: 23
Albumin: 4.1
Calcium: 9.9
Chloride: 107
Creatinine, Ser: 0.91
GFR calc Af Amer: >60
Total Protein: 7.6

## 2011-03-11 LAB — CBC
MCHC: 35
MCV: 88.6
Platelets: 253
WBC: 6.7

## 2011-03-11 LAB — POCT CARDIAC MARKERS
CKMB, poc: <1 — ABNORMAL LOW
Myoglobin, poc: 71.7

## 2011-04-19 ENCOUNTER — Telehealth: Payer: Self-pay | Admitting: Family Medicine

## 2011-04-19 DIAGNOSIS — N390 Urinary tract infection, site not specified: Secondary | ICD-10-CM

## 2011-04-19 NOTE — Telephone Encounter (Signed)
Order printed and will send urine for culture

## 2011-04-19 NOTE — Telephone Encounter (Signed)
Yes she can send in a urine sample- Send for culture.

## 2011-04-22 LAB — URINE CULTURE
Colony Count: NO GROWTH
Organism ID, Bacteria: NO GROWTH

## 2011-07-02 ENCOUNTER — Telehealth: Payer: Self-pay | Admitting: Family Medicine

## 2011-07-02 NOTE — Telephone Encounter (Signed)
Pt aware. Transferred to schedule appointment.

## 2011-07-02 NOTE — Telephone Encounter (Signed)
Spoke with pt she is concerned that her b/p was too low the first time that she took it. Please advise.

## 2011-07-02 NOTE — Telephone Encounter (Signed)
Please tell her to hold her blood pressure medication for BP for systolic less than 100. She needs to schedule an office visit If she has severe arm pain associated with chest pain or difficulty breathing she needs to go to the ER.

## 2011-07-05 ENCOUNTER — Emergency Department (HOSPITAL_COMMUNITY)
Admission: EM | Admit: 2011-07-05 | Discharge: 2011-07-05 | Disposition: A | Discharge status: Home / Self Care | Payer: Medicare Other | Attending: Emergency Medicine | Admitting: Emergency Medicine

## 2011-07-05 ENCOUNTER — Encounter (HOSPITAL_COMMUNITY): Payer: Self-pay

## 2011-07-05 ENCOUNTER — Other Ambulatory Visit: Payer: Self-pay

## 2011-07-05 ENCOUNTER — Telehealth: Payer: Self-pay | Admitting: Family Medicine

## 2011-07-05 ENCOUNTER — Emergency Department (HOSPITAL_COMMUNITY): Payer: Medicare Other

## 2011-07-05 DIAGNOSIS — IMO0002 Reserved for concepts with insufficient information to code with codable children: Secondary | ICD-10-CM | POA: Insufficient documentation

## 2011-07-05 DIAGNOSIS — R51 Headache: Secondary | ICD-10-CM | POA: Insufficient documentation

## 2011-07-05 DIAGNOSIS — Z96659 Presence of unspecified artificial knee joint: Secondary | ICD-10-CM | POA: Insufficient documentation

## 2011-07-05 DIAGNOSIS — K589 Irritable bowel syndrome without diarrhea: Secondary | ICD-10-CM | POA: Insufficient documentation

## 2011-07-05 DIAGNOSIS — R059 Cough, unspecified: Secondary | ICD-10-CM | POA: Insufficient documentation

## 2011-07-05 DIAGNOSIS — R5381 Other malaise: Secondary | ICD-10-CM | POA: Insufficient documentation

## 2011-07-05 DIAGNOSIS — M129 Arthropathy, unspecified: Secondary | ICD-10-CM | POA: Insufficient documentation

## 2011-07-05 DIAGNOSIS — I10 Essential (primary) hypertension: Secondary | ICD-10-CM | POA: Insufficient documentation

## 2011-07-05 DIAGNOSIS — Z9079 Acquired absence of other genital organ(s): Secondary | ICD-10-CM | POA: Insufficient documentation

## 2011-07-05 DIAGNOSIS — IMO0001 Reserved for inherently not codable concepts without codable children: Secondary | ICD-10-CM | POA: Insufficient documentation

## 2011-07-05 DIAGNOSIS — M25512 Pain in left shoulder: Secondary | ICD-10-CM

## 2011-07-05 DIAGNOSIS — F3289 Other specified depressive episodes: Secondary | ICD-10-CM | POA: Insufficient documentation

## 2011-07-05 DIAGNOSIS — R05 Cough: Secondary | ICD-10-CM | POA: Insufficient documentation

## 2011-07-05 DIAGNOSIS — M25519 Pain in unspecified shoulder: Secondary | ICD-10-CM | POA: Insufficient documentation

## 2011-07-05 DIAGNOSIS — F329 Major depressive disorder, single episode, unspecified: Secondary | ICD-10-CM | POA: Insufficient documentation

## 2011-07-05 DIAGNOSIS — R079 Chest pain, unspecified: Secondary | ICD-10-CM | POA: Insufficient documentation

## 2011-07-05 DIAGNOSIS — M79609 Pain in unspecified limb: Secondary | ICD-10-CM | POA: Insufficient documentation

## 2011-07-05 DIAGNOSIS — K219 Gastro-esophageal reflux disease without esophagitis: Secondary | ICD-10-CM | POA: Insufficient documentation

## 2011-07-05 LAB — CBC
HCT: 40.5 % (ref 36.0–46.0)
MCH: 30.3 pg (ref 26.0–34.0)
MCHC: 34.8 g/dL (ref 30.0–36.0)
RDW: 13.7 % (ref 11.5–15.5)

## 2011-07-05 LAB — DIFFERENTIAL
Basophils Absolute: 0 10*3/uL (ref 0.0–0.1)
Eosinophils Relative: 1 % (ref 0–5)
Lymphocytes Relative: 31 % (ref 12–46)
Lymphs Abs: 1.7 10*3/uL (ref 0.7–4.0)
Neutro Abs: 3.2 10*3/uL (ref 1.7–7.7)
Neutrophils Relative %: 56 % (ref 43–77)

## 2011-07-05 LAB — TROPONIN I: Troponin I: <0.3 ng/mL (ref <0.30)

## 2011-07-05 LAB — COMPREHENSIVE METABOLIC PANEL
Albumin: 3.9 g/dL (ref 3.5–5.2)
Alkaline Phosphatase: 84 U/L (ref 39–117)
BUN: 13 mg/dL (ref 6–23)
Calcium: 10.1 mg/dL (ref 8.4–10.5)
Potassium: 3.8 mEq/L (ref 3.5–5.1)
Total Protein: 7.6 g/dL (ref 6.0–8.3)

## 2011-07-05 MED ORDER — ACETAMINOPHEN 500 MG PO TABS
1000.0000 mg | ORAL_TABLET | Freq: Once | ORAL | Status: DC
  Filled 2011-07-05: qty 2

## 2011-07-05 MED ORDER — LORAZEPAM 1 MG PO TABS
1.0000 mg | ORAL_TABLET | Freq: Once | ORAL | Status: AC
  Administered 2011-07-05: 1 mg via ORAL
  Filled 2011-07-05: qty 1

## 2011-07-05 NOTE — Telephone Encounter (Signed)
Does she need ativan? Almost all the message was cut off

## 2011-07-05 NOTE — ED Notes (Signed)
Pt does not want ice pack. C/o being cold. Warm blanket given to pt and pt still does not want Ice pack. Pt states, " I can't take 1000mg  of Tylenol it will put me asleep. I can take Ativan." instructed pt that tylenol is a non narcotic. Pt still states it will put her to sleep.

## 2011-07-05 NOTE — Telephone Encounter (Signed)
No. She needs to speak with nurse about going to er and them giving her ativan.

## 2011-07-05 NOTE — ED Notes (Signed)
Pt reports left clavicle pain and left arm pain for the past "several weeks."  Pt denies injury.  Denies chest pain.

## 2011-07-05 NOTE — ED Provider Notes (Signed)
This chart was scribed for LOCKWOD by Williemae Natter. The patient was seen in room APA11/[facility]11 at 7:24 AM.  CSN: [id number]  Arrival date & time 07/05/11  7829   None     Chief Complaint  Patient presents with  . Arm Pain    (Consider location/radiation/quality/duration/timing/severity/associated sxs/prior treatment) HPI Pamela Vasquez is a 61 y.o. female who presents to the Emergency Department complaining of intermittent L arm pain for the past month. Pt states that the pain radiates all the way down her left arm and is especially worse today. Pt noted her blood pressure being higher than usual. Pain is worsened with anxiety and relieved by rest. Pt states that she has pressure headaches in the right side of her head. She denies any confusion, disorientation, nausea, vomiting, diarrhea, or any trouble breathing. Pt treated with Tylenol with mild improvement to pain. Pt has a chronic cough.  Past Medical History  Diagnosis Date  . Arthritis   . Hypertension   . Fibromyalgia   . Chronic fatigue   . Acid reflux   . Degenerative disc disease   . Irritable bowel syndrome (IBS)   . Interstitial cystitis   . Rheumatic fever   . Depression   . Blepharitis     Recurrent    Past Surgical History  Procedure Date  . Knee arthroplasty   . Carpal tunnel release   . Wrist arthroplasty   . Cholecystectomy   . Oophorectomy   . Abdominal hysterectomy     Family History  Problem Relation Age of Onset  . Osteoarthritis Mother   . Heart disease Mother   . Hyperlipidemia Mother   . Cancer Father     colon  . Hyperlipidemia Father   . Heart disease Father   . Rheum arthritis Brother   . Osteoarthritis Brother   . Depression Brother   . Heart disease Brother   . Hyperlipidemia Brother   . Cancer Other   . Depression Sister   . Heart disease Sister   . Hyperlipidemia Sister     History  Substance Use Topics  . Smoking status: Never Smoker   . Smokeless tobacco: Not on file   . Alcohol Use: No    OB History    Grav Para Term Preterm Abortions TAB SAB Ect Mult Living   0               Review of Systems  Constitutional: Negative for fever.  HENT: Negative for rhinorrhea.   Eyes: Negative for photophobia.  Respiratory: Positive for cough. Negative for shortness of breath.   Cardiovascular: Positive for chest pain.  Gastrointestinal: Negative for nausea, vomiting and diarrhea.  Genitourinary: Negative for dysuria and hematuria.  Neurological: Positive for headaches.  Psychiatric/Behavioral: Negative for confusion.  All other systems reviewed and are negative.    Allergies  Aspirin; Codeine; and Demerol  Home Medications   Current Outpatient Rx  Name Route Sig Dispense Refill  . ACETAMINOPHEN 325 MG PO TABS Oral Take 650 mg by mouth 3 (three) times daily.      Marland Kitchen ALPRAZOLAM 0.25 MG PO TABS Oral Take 0.5 tablets (0.125 mg total) by mouth 2 (two) times daily. 30 tablet 5  . REFRESH P.M. OP OINT Both Eyes Place into both eyes as needed.      . ATORVASTATIN CALCIUM 40 MG PO TABS  1 tablet daily at bedtime  30 tablet 6  . HYDROCOD POLST-CPM POLST ER 10-8 MG/5ML PO LQCR Oral Take 5  mLs by mouth every 12 (twelve) hours as needed. 115 mL 0  . ESOMEPRAZOLE MAGNESIUM 40 MG PO CPDR Oral Take 1 capsule (40 mg total) by mouth daily before breakfast. 30 capsule 6  . GUAIFENESIN-DM 100-10 MG/5ML PO SYRP Oral Take 5 mLs by mouth 3 (three) times daily as needed.      Marland Kitchen LISINOPRIL 5 MG PO TABS Oral Take 1 tablet (5 mg total) by mouth 2 (two) times daily. 60 tablet 6  . NIACIN 500 MG PO TABS  Take 1 tablet at bedtime x 1 week, then increase to 1 tablet twice a day with meals 60 tablet 1  Pulse oximetry on room air is 99%. Normal by my interpretation.  Ht 5\' 4"  (1.626 m)  Wt 145 lb (65.772 kg)  BMI 24.89 kg/m2  Physical Exam  Nursing note and vitals reviewed. Constitutional: She is oriented to person, place, and time. She appears well-developed and well-nourished.   HENT:  Head: Normocephalic and atraumatic.  Eyes: Pupils are equal, round, and reactive to light.  Neck: Normal range of motion. Neck supple.  Cardiovascular: Normal rate, regular rhythm and normal heart sounds.   Pulmonary/Chest: Effort normal and breath sounds normal.  Musculoskeletal:       4/5 strength (due to effort)   Neurological: She is alert and oriented to person, place, and time. No cranial nerve deficit. She exhibits abnormal muscle tone. Coordination normal.  Skin: Skin is warm and dry.  Psychiatric: She has a normal mood and affect. Her behavior is normal.    ED Course  Procedures (including critical care time)  Labs Reviewed - No data to display No results found.   No diagnosis found.   Date: 07/05/2011  Rate: 91  Rhythm: normal sinus rhythm  QRS Axis: normal  Intervals: normal  ST/T Wave abnormalities: normal  Conduction Disutrbances:none  Narrative Interpretation:   Old EKG Reviewed: unchanged  XR (reviewed by me) no acute findings  9:24 AM Patient notes improvement in her shoulder pain.  She defers tylenol and ice packs.  MDM  This 61 year old female now presents with one month of pain focally about the patient's left shoulder.  On exam the patient is in no distress, though she is deconditioned, with strength seemingly limited by effort.  The patient's radiographic studies, an ECG and labs are all reassuring for absence of acute pathology, similarly reassuring.  Is the description of one month of complaints.  The patient's noted improvement following ED interventions is further reassurance for the high probability of this being an episode of musculoskeletal pain perhaps exacerbated by anxiety.  The patient's history of fibromyalgia is consistent with this complaint as well.  Given the absence of any exertional symptoms, significant, chest pain, any lightheadedness, nausea, vomiting, syncope.  There is little suspicion for cardiac etiology.  Similarly, the  absence of cough, dyspnea, chest pain, is reassuring for absence of acute pulmonary etiology.  The patient was discharged in stable condition to follow up with her primary care physician      I personally performed the services described in this documentation, which was scribed in my presence. The recorded information has been reviewed and considered.    Gerhard Munch, MD 07/05/11 [phone]

## 2011-07-06 ENCOUNTER — Encounter: Payer: Self-pay | Admitting: Family Medicine

## 2011-07-06 ENCOUNTER — Ambulatory Visit (INDEPENDENT_AMBULATORY_CARE_PROVIDER_SITE_OTHER): Payer: Medicare Other | Admitting: Family Medicine

## 2011-07-06 VITALS — BP 160/98 | HR 106 | Resp 16 | Ht 64.0 in | Wt 154.0 lb

## 2011-07-06 DIAGNOSIS — R5383 Other fatigue: Secondary | ICD-10-CM

## 2011-07-06 DIAGNOSIS — K0889 Other specified disorders of teeth and supporting structures: Secondary | ICD-10-CM

## 2011-07-06 DIAGNOSIS — R5381 Other malaise: Secondary | ICD-10-CM

## 2011-07-06 DIAGNOSIS — IMO0001 Reserved for inherently not codable concepts without codable children: Secondary | ICD-10-CM

## 2011-07-06 DIAGNOSIS — F411 Generalized anxiety disorder: Secondary | ICD-10-CM

## 2011-07-06 DIAGNOSIS — R053 Chronic cough: Secondary | ICD-10-CM

## 2011-07-06 DIAGNOSIS — F3289 Other specified depressive episodes: Secondary | ICD-10-CM

## 2011-07-06 DIAGNOSIS — I10 Essential (primary) hypertension: Secondary | ICD-10-CM

## 2011-07-06 DIAGNOSIS — R5382 Chronic fatigue, unspecified: Secondary | ICD-10-CM

## 2011-07-06 DIAGNOSIS — R05 Cough: Secondary | ICD-10-CM

## 2011-07-06 DIAGNOSIS — R059 Cough, unspecified: Secondary | ICD-10-CM

## 2011-07-06 DIAGNOSIS — K219 Gastro-esophageal reflux disease without esophagitis: Secondary | ICD-10-CM

## 2011-07-06 DIAGNOSIS — F329 Major depressive disorder, single episode, unspecified: Secondary | ICD-10-CM

## 2011-07-06 DIAGNOSIS — K089 Disorder of teeth and supporting structures, unspecified: Secondary | ICD-10-CM

## 2011-07-06 MED ORDER — ESOMEPRAZOLE MAGNESIUM 40 MG PO CPDR: 40.0000 mg | DELAYED_RELEASE_CAPSULE | Freq: Two times a day (BID) | ORAL | Status: DC

## 2011-07-06 MED ORDER — PENICILLIN V POTASSIUM 500 MG PO TABS: 500.0000 mg | ORAL_TABLET | Freq: Three times a day (TID) | ORAL | Status: AC

## 2011-07-06 NOTE — Telephone Encounter (Signed)
Wants to know if its ok to cancel her appt today. I will give her a call back

## 2011-07-06 NOTE — Telephone Encounter (Signed)
I would prefer if she come into the office to be seen, she needs to be seen at least every 6 months. Please ask her to reschedule for next week if she does not want to come in today

## 2011-07-06 NOTE — Patient Instructions (Addendum)
Increase your nexium to twice a day Continue your lisinopril twice a day  Take your blood pressure in the middle of the day, Please call with your blood pressures in 2 weeks, please write them down, you may need more medication Take a 1 tablet of the xanax before bedtime If the cough does not improve then I will refer you to Gastroenterology Make F/U with the dentist  Use the magic mouthwash Take the antibiotics  F/U 3 months

## 2011-07-06 NOTE — Progress Notes (Signed)
  Subjective:    Patient ID: Pamela Vasquez, female    DOB: October 03, 1950, 61 y.o.   MRN: [id number]  HPI   Left arm pain- for past 4-5 weeks, EMS came to home yesterday pt had elevated Blood pressure, sent to [facility], EKG, CXR,labs normal  HTN- blood pressure 120-130/70-80's, yesterday was 145/80, in ER blood pressure was elevated   Taking lisinopril 5mg  BID  Cough- chronic for a few years, feels like she gets choked on meats and her medications , gets some reflux that makes cough worse, cough is dry in nature, she thinks it was there before starting ACEI   GERD- takes nexium at 4am before breakfast, has had EGD in the past  Anxiety- using meds twice a day, has HA, running out of medication, has used a whole tablet to help her sleep better at bedtime  Dental pain- noticed white "stuff" along gumline, she has a dentist but is too tired to schedule one   Review of Systems  GEN- +fatigue,denies  fever, weight loss,weakness, recent illness HEENT- denies eye drainage, change in vision, nasal discharge, CVS- occ chest pain, palpitations RESP- denies SOB, cough, wheeze ABD- denies N/V, +change in stools- IBS, abd pain GU- denies dysuria, hematuria, dribbling, incontinence MSK- + joint pain, +muscle aches,  No injury Neuro- +headache, no dizziness, syncope, seizure activity    Objective:   Physical Exam GEN- NAD, alert and oriented x3, sitting in wheelchair, fatigued appearing,  HEENT- PERRL, EOMI, non injected sclera, injected bulbar conjunctiva, MMM, oropharynx clear, lower gumline inflamed with white exudate into gumline, mild TTP, no discrete abscess Neck- Supple,  no bruit CVS- Tachycardic, no murmur RESP-CTAB EXT- No edema Pulses- Radial, DP- 2+ Psych- depressed, flat affect, normal speech but slow, not overly anxious appearing        Assessment & Plan:  Approx 25 minutes spent with patient

## 2011-07-06 NOTE — Telephone Encounter (Signed)
Stated she would try her best to come. Told her if she seen she couldn't to call and let us know

## 2011-07-07 ENCOUNTER — Encounter: Payer: Self-pay | Admitting: Family Medicine

## 2011-07-07 DIAGNOSIS — K0889 Other specified disorders of teeth and supporting structures: Secondary | ICD-10-CM | POA: Insufficient documentation

## 2011-07-07 DIAGNOSIS — R05 Cough: Secondary | ICD-10-CM | POA: Insufficient documentation

## 2011-07-07 DIAGNOSIS — R053 Chronic cough: Secondary | ICD-10-CM | POA: Insufficient documentation

## 2011-07-07 NOTE — Assessment & Plan Note (Signed)
Pt declines any referrals, differential ACEI side effect, GERD, cough variant asthma Increase PPI at this time

## 2011-07-07 NOTE — Assessment & Plan Note (Signed)
Start antibiotics, pt has magic mouthwash at home, she is to call the dentist

## 2011-07-07 NOTE — Assessment & Plan Note (Signed)
Increase PPI to BID dosing

## 2011-07-07 NOTE — Assessment & Plan Note (Signed)
unchanaged 

## 2011-07-07 NOTE — Assessment & Plan Note (Signed)
She has longstanding history of chronic fatigue, fibromyalgia and depression. I feel her depression is severe but she has been on many meds and refuses to take any new prescriptions. She is on bed-rest for fatigue which worsens her depression and anxiety daily

## 2011-07-07 NOTE — Assessment & Plan Note (Signed)
Stress, anxiety and fibromyalgia likely cause of recent arm pain

## 2011-07-07 NOTE — Assessment & Plan Note (Signed)
Deteriorated, will increase benzo to 1 whole tablet before bedtime

## 2011-07-07 NOTE — Assessment & Plan Note (Signed)
Pt very reluctant to change her medication, even in setting of chronic cough, EKG normal She will call with home values, I think she needs increase in ACEI

## 2011-07-15 ENCOUNTER — Other Ambulatory Visit: Payer: Self-pay

## 2011-07-15 MED ORDER — ALPRAZOLAM 0.25 MG PO TABS: ORAL_TABLET | ORAL | Status: DC

## 2011-07-15 NOTE — Telephone Encounter (Signed)
Noted. Medication refilled.

## 2011-08-09 ENCOUNTER — Ambulatory Visit: Payer: Medicare Other | Admitting: Family Medicine

## 2011-09-30 ENCOUNTER — Telehealth: Payer: Self-pay | Admitting: Family Medicine

## 2011-09-30 MED ORDER — LISINOPRIL 5 MG PO TABS: 5.0000 mg | ORAL_TABLET | Freq: Two times a day (BID) | ORAL | Status: DC

## 2011-09-30 MED ORDER — ALPRAZOLAM 0.25 MG PO TABS: ORAL_TABLET | ORAL | Status: DC

## 2011-09-30 NOTE — Telephone Encounter (Signed)
At [facility]. Please call patient back to let her know

## 2011-09-30 NOTE — Telephone Encounter (Signed)
Called and left message that meds were being sent in

## 2011-10-04 ENCOUNTER — Ambulatory Visit: Payer: Medicare Other | Admitting: Family Medicine

## 2011-10-04 ENCOUNTER — Telehealth: Payer: Self-pay | Admitting: Family Medicine

## 2011-10-04 MED ORDER — NYSTATIN 100000 UNIT/ML MT SUSP: 500000.0000 [IU] | Freq: Four times a day (QID) | OROMUCOSAL | Status: AC

## 2011-10-04 NOTE — Telephone Encounter (Signed)
done

## 2011-10-29 ENCOUNTER — Other Ambulatory Visit: Payer: Self-pay | Admitting: Family Medicine

## 2011-11-02 ENCOUNTER — Telehealth: Payer: Self-pay | Admitting: Family Medicine

## 2011-11-02 NOTE — Telephone Encounter (Signed)
Noted  

## 2011-11-09 ENCOUNTER — Telehealth: Payer: Self-pay

## 2011-11-09 MED ORDER — FLUCONAZOLE 150 MG PO TABS: 150.0000 mg | ORAL_TABLET | Freq: Once | ORAL | Status: AC

## 2011-11-09 NOTE — Telephone Encounter (Signed)
Sent!

## 2011-11-20 ENCOUNTER — Emergency Department (HOSPITAL_COMMUNITY): Payer: Medicare Other

## 2011-11-20 ENCOUNTER — Encounter (HOSPITAL_COMMUNITY): Payer: Self-pay | Admitting: *Deleted

## 2011-11-20 ENCOUNTER — Emergency Department (HOSPITAL_COMMUNITY)
Admission: EM | Admit: 2011-11-20 | Discharge: 2011-11-20 | Disposition: A | Discharge status: Home / Self Care | Payer: Medicare Other | Attending: Emergency Medicine | Admitting: Emergency Medicine

## 2011-11-20 DIAGNOSIS — R079 Chest pain, unspecified: Secondary | ICD-10-CM | POA: Insufficient documentation

## 2011-11-20 DIAGNOSIS — R002 Palpitations: Secondary | ICD-10-CM

## 2011-11-20 DIAGNOSIS — IMO0001 Reserved for inherently not codable concepts without codable children: Secondary | ICD-10-CM | POA: Insufficient documentation

## 2011-11-20 DIAGNOSIS — R000 Tachycardia, unspecified: Secondary | ICD-10-CM | POA: Insufficient documentation

## 2011-11-20 DIAGNOSIS — IMO0002 Reserved for concepts with insufficient information to code with codable children: Secondary | ICD-10-CM | POA: Insufficient documentation

## 2011-11-20 DIAGNOSIS — M129 Arthropathy, unspecified: Secondary | ICD-10-CM | POA: Insufficient documentation

## 2011-11-20 DIAGNOSIS — K589 Irritable bowel syndrome without diarrhea: Secondary | ICD-10-CM | POA: Insufficient documentation

## 2011-11-20 DIAGNOSIS — R42 Dizziness and giddiness: Secondary | ICD-10-CM | POA: Insufficient documentation

## 2011-11-20 DIAGNOSIS — R5381 Other malaise: Secondary | ICD-10-CM | POA: Insufficient documentation

## 2011-11-20 DIAGNOSIS — Z79899 Other long term (current) drug therapy: Secondary | ICD-10-CM | POA: Insufficient documentation

## 2011-11-20 DIAGNOSIS — K219 Gastro-esophageal reflux disease without esophagitis: Secondary | ICD-10-CM | POA: Insufficient documentation

## 2011-11-20 DIAGNOSIS — I10 Essential (primary) hypertension: Secondary | ICD-10-CM | POA: Insufficient documentation

## 2011-11-20 HISTORY — DX: Dizziness and giddiness: R42

## 2011-11-20 LAB — CBC
HCT: 40.9 % (ref 36.0–46.0)
MCHC: 35 g/dL (ref 30.0–36.0)
Platelets: 253 10*3/uL (ref 150–400)
RDW: 13.2 % (ref 11.5–15.5)
WBC: 8.3 10*3/uL (ref 4.0–10.5)

## 2011-11-20 LAB — DIFFERENTIAL
Basophils Absolute: 0 10*3/uL (ref 0.0–0.1)
Basophils Relative: 0 % (ref 0–1)
Lymphocytes Relative: 22 % (ref 12–46)
Monocytes Absolute: 0.7 10*3/uL (ref 0.1–1.0)
Neutro Abs: 5.6 10*3/uL (ref 1.7–7.7)
Neutrophils Relative %: 68 % (ref 43–77)

## 2011-11-20 LAB — BASIC METABOLIC PANEL
CO2: 25 mEq/L (ref 19–32)
Chloride: 103 mEq/L (ref 96–112)
GFR calc Af Amer: 85 mL/min — ABNORMAL LOW (ref >90)
Potassium: 3.7 mEq/L (ref 3.5–5.1)
Sodium: 140 mEq/L (ref 135–145)

## 2011-11-20 LAB — TROPONIN I: Troponin I: <0.3 ng/mL (ref <0.30)

## 2011-11-20 MED ORDER — LORAZEPAM 1 MG PO TABS
1.0000 mg | ORAL_TABLET | Freq: Once | ORAL | Status: AC
  Administered 2011-11-20: 1 mg via ORAL
  Filled 2011-11-20: qty 1

## 2011-11-20 MED ORDER — ACETAMINOPHEN 325 MG PO TABS
650.0000 mg | ORAL_TABLET | Freq: Once | ORAL | Status: AC
  Administered 2011-11-20: 650 mg via ORAL
  Filled 2011-11-20: qty 2

## 2011-11-20 MED ORDER — MECLIZINE HCL 25 MG PO TABS: 25.0000 mg | ORAL_TABLET | Freq: Four times a day (QID) | ORAL | Status: AC | PRN

## 2011-11-20 NOTE — ED Notes (Signed)
Pt finishing breakfast tray before being d/c.  nad noted.

## 2011-11-20 NOTE — ED Notes (Signed)
Pt arrived by ems, called out for fast heart rate. Pt alos hypertensive. ems gave 2 nitro tabs in route to the hospital.

## 2011-11-20 NOTE — ED Provider Notes (Signed)
History     CSN: [id number]  Arrival date & time 11/20/11  0541   First MD Initiated Contact with Patient 11/20/11 0550      Chief Complaint  Patient presents with  . Tachycardia  . Hypertension    (Consider location/radiation/quality/duration/timing/severity/associated sxs/prior treatment) HPI  Past Medical History  Diagnosis Date  . Arthritis   . Hypertension   . Fibromyalgia   . Chronic fatigue   . Acid reflux   . Degenerative disc disease   . Irritable bowel syndrome (IBS)   . Interstitial cystitis   . Rheumatic fever   . Depression   . Blepharitis     Recurrent  . Vertigo     Past Surgical History  Procedure Date  . Knee arthroplasty   . Carpal tunnel release   . Wrist arthroplasty   . Cholecystectomy   . Oophorectomy   . Abdominal hysterectomy     Family History  Problem Relation Age of Onset  . Osteoarthritis Mother   . Heart disease Mother   . Hyperlipidemia Mother   . Cancer Father     colon  . Hyperlipidemia Father   . Heart disease Father   . Rheum arthritis Brother   . Osteoarthritis Brother   . Depression Brother   . Heart disease Brother   . Hyperlipidemia Brother   . Cancer Other   . Depression Sister   . Heart disease Sister   . Hyperlipidemia Sister     History  Substance Use Topics  . Smoking status: Never Smoker   . Smokeless tobacco: Not on file  . Alcohol Use: No    OB History    Grav Para Term Preterm Abortions TAB SAB Ect Mult Living   0               Review of Systems  Allergies  Aspirin; Codeine; Demerol; and Nsaids  Home Medications   Current Outpatient Rx  Name Route Sig Dispense Refill  . ACETAMINOPHEN 500 MG PO TABS Oral Take 250-500 mg by mouth every 6 (six) hours as needed. For pain or headaches    . ALPRAZOLAM 0.25 MG PO TABS  Take 1 tablet three times a day as needed for anxiety 60 tablet 1  . REFRESH P.M. OP OINT Both Eyes Place 1 drop into both eyes as needed. For dry eyes    . ESOMEPRAZOLE  MAGNESIUM 40 MG PO CPDR Oral Take 1 capsule (40 mg total) by mouth 2 (two) times daily. 60 capsule 3  . LISINOPRIL 5 MG PO TABS Oral Take 1 tablet (5 mg total) by mouth 2 (two) times daily. 60 tablet 3  . NYSTATIN 100000 UNIT/ML MT SUSP Oral Take 500,000 Units by mouth 4 (four) times daily.      BP 157/97  Pulse 102  Temp(Src) 98.2 F (36.8 C) (Oral)  Resp 25  Ht 5\' 4"  (1.626 m)  Wt 146 lb (66.225 kg)  BMI 25.06 kg/m2  SpO2 96%  Physical Exam  ED Course  Procedures (including critical care time)  Labs Reviewed  BASIC METABOLIC PANEL - Abnormal; Notable for the following:    Glucose, Bld 101 (*)    GFR calc non Af Amer 74 (*)    GFR calc Af Amer 85 (*)    All other components within normal limits  TROPONIN I  CBC  DIFFERENTIAL   Dg Chest 2 View  11/20/2011  *RADIOLOGY REPORT*  Clinical Data: Tachycardia and hypertension.  CHEST - 2 VIEW  Comparison: Two-view chest 07/05/2011.  Findings: The heart size is normal.  The lungs are clear.  The visualized soft tissues and bony thorax are unremarkable.  IMPRESSION: Negative chest.  Original Report Authenticated By: Jamesetta Orleans. MATTERN, M.D.   Ct Head Wo Contrast  11/20/2011  *RADIOLOGY REPORT*  Clinical Data: Dizziness and weakness.  CT HEAD WITHOUT CONTRAST  Technique:  Contiguous axial images were obtained from the base of the skull through the vertex without contrast.  Comparison: Limited sinus CT 07/01/2003.  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  I favor the former.  No acute cortical infarct, hemorrhage, or mass lesion is present.  The ventricles are of normal size.  No significant extra-axial fluid collection is present.  The paranasal sinuses and mastoid air cells are clear.  The osseous skull is intact.  IMPRESSION: Negative CT of the head.  Original Report Authenticated By: Jamesetta Orleans. MATTERN, M.D.     1. Heart palpitations   2. Hypertension       MDM    Recheck at 2045:  Alert, oriented.  Eating breakfast. No acute distress. Labs and x-rays reviewed with patient, daughter, mother. We'll discharge home. Rx Antivert 25 mg #20        Donnetta Hutching, MD 11/20/11 [phone]

## 2011-11-20 NOTE — ED Notes (Signed)
Family reports pt stays in bed "24/7", states pt has been depressed since she recently found out that her ex-husband is getting remarried.  Pt getting anxious in room, tearful, requesting ativan.  edp notified.

## 2011-11-20 NOTE — ED Notes (Signed)
Assisted pt to and from bedside commode.  Given ginger ale per pt request.  Lights turned down for pt comfort.  nad noted.

## 2011-11-20 NOTE — Discharge Instructions (Signed)
Pulse is slightly elevated, however other tests were normal. Followup your primary care Dr. Medication for dizziness.

## 2011-11-20 NOTE — ED Notes (Signed)
Pt reports dizziness at times with ambulation and standing; Pt states Chest Pain only comes when HR is increased; Pt denies CP at this time

## 2011-11-20 NOTE — ED Notes (Signed)
MD at bedside. 

## 2011-11-20 NOTE — ED Provider Notes (Signed)
History     CSN: [id number]  Arrival date & time 11/20/11  0541   First MD Initiated Contact with Patient 11/20/11 0550      Chief Complaint  Patient presents with  . Tachycardia  . Hypertension    (Consider location/radiation/quality/duration/timing/severity/associated sxs/prior treatment) Patient is a 61 y.o. female presenting with hypertension. The history is provided by the patient and the EMS personnel.  Hypertension This is a recurrent problem. The current episode started 1 to 2 hours ago. The problem occurs constantly. The problem has been gradually improving. Associated symptoms include chest pain. Pertinent negatives include no abdominal pain, no headaches and no shortness of breath. The symptoms are aggravated by nothing. The symptoms are relieved by nothing.   Patient brought in by EMS called out for fast heart rate patient also hypotensive mild chest discomfort EMS gave patient 2 nitroglycerin tabs route to the hospital patient states it didn't really help her blood pressure very much. Patient with a history significant for hypertension fibromyalgia chronic fatigue. Patient stated that she routinely checks her blood pressure around 4:30 or 5:30 each morning she awoke to do that was not very symptomatic other than feeling fatigued. No complaint of chest pain or rapid heart rate at that time check her blood pressure following the check of her blood pressure she noted that it was significantly elevated then she started to get palpitations. With the palpitation had some mild chest discomfort in the substernal area that is now resolved. No severe chest pain no sniffing shortness of breath no nausea no vomiting no diarrhea. No sweating. Past Medical History  Diagnosis Date  . Arthritis   . Hypertension   . Fibromyalgia   . Chronic fatigue   . Acid reflux   . Degenerative disc disease   . Irritable bowel syndrome (IBS)   . Interstitial cystitis   . Rheumatic fever   . Depression     . Blepharitis     Recurrent  . Vertigo     Past Surgical History  Procedure Date  . Knee arthroplasty   . Carpal tunnel release   . Wrist arthroplasty   . Cholecystectomy   . Oophorectomy   . Abdominal hysterectomy     Family History  Problem Relation Age of Onset  . Osteoarthritis Mother   . Heart disease Mother   . Hyperlipidemia Mother   . Cancer Father     colon  . Hyperlipidemia Father   . Heart disease Father   . Rheum arthritis Brother   . Osteoarthritis Brother   . Depression Brother   . Heart disease Brother   . Hyperlipidemia Brother   . Cancer Other   . Depression Sister   . Heart disease Sister   . Hyperlipidemia Sister     History  Substance Use Topics  . Smoking status: Never Smoker   . Smokeless tobacco: Not on file  . Alcohol Use: No    OB History    Grav Para Term Preterm Abortions TAB SAB Ect Mult Living   0               Review of Systems  Constitutional: Positive for fatigue. Negative for fever.  HENT: Negative for congestion and neck pain.   Eyes: Negative for visual disturbance.  Respiratory: Negative for shortness of breath.   Cardiovascular: Positive for chest pain and palpitations. Negative for leg swelling.  Gastrointestinal: Negative for nausea, vomiting, abdominal pain and diarrhea.  Genitourinary: Negative for dysuria.  Musculoskeletal: Negative  for back pain.  Skin: Negative for rash.  Neurological: Positive for dizziness. Negative for syncope, speech difficulty, light-headedness and headaches.  Hematological: Does not bruise/bleed easily.    Allergies  Aspirin; Codeine; Demerol; and Nsaids  Home Medications   Current Outpatient Rx  Name Route Sig Dispense Refill  . ACETAMINOPHEN 500 MG PO TABS Oral Take 250-500 mg by mouth every 6 (six) hours as needed. For pain or headaches    . ALPRAZOLAM 0.25 MG PO TABS  Take 1 tablet three times a day as needed for anxiety 60 tablet 1  . REFRESH P.M. OP OINT Both Eyes Place 1  drop into both eyes as needed. For dry eyes    . ESOMEPRAZOLE MAGNESIUM 40 MG PO CPDR Oral Take 1 capsule (40 mg total) by mouth 2 (two) times daily. 60 capsule 3  . LISINOPRIL 5 MG PO TABS Oral Take 1 tablet (5 mg total) by mouth 2 (two) times daily. 60 tablet 3  . NYSTATIN 100000 UNIT/ML MT SUSP Oral Take 500,000 Units by mouth 4 (four) times daily.      BP 136/92  Pulse 112  Temp(Src) 98.2 F (36.8 C) (Oral)  Resp 25  Ht 5\' 4"  (1.626 m)  Wt 146 lb (66.225 kg)  BMI 25.06 kg/m2  SpO2 100%  Physical Exam  Nursing note and vitals reviewed. Constitutional: She is oriented to person, place, and time. She appears well-developed and well-nourished.  HENT:  Head: Normocephalic and atraumatic.  Mouth/Throat: Oropharynx is clear and moist.  Eyes: Conjunctivae and EOM are normal. Pupils are equal, round, and reactive to light.  Neck: Normal range of motion. Neck supple.  Cardiovascular: Normal rate, regular rhythm and normal heart sounds.   No murmur heard.      Regular slightly tachycardic  Pulmonary/Chest: Effort normal and breath sounds normal. No respiratory distress.  Abdominal: Soft. Bowel sounds are normal. There is no tenderness.  Musculoskeletal: Normal range of motion. She exhibits no edema.  Neurological: She is alert and oriented to person, place, and time. No cranial nerve deficit. She exhibits normal muscle tone. Coordination normal.  Skin: Skin is warm. No rash noted.    ED Course  Procedures (including critical care time)   Labs Reviewed  TROPONIN I  CBC  DIFFERENTIAL  BASIC METABOLIC PANEL   No results found.   Date: 11/20/2011  Rate: 101  Rhythm: sinus tachycardia  QRS Axis: normal  Intervals: normal  ST/T Wave abnormalities: normal  Conduction Disutrbances:none  Narrative Interpretation:   Old EKG Reviewed: unchanged EKG is unchanged from 07/05/2011   1. Heart palpitations   2. Hypertension       MDM  Patient currently without significant  tachycardia no arrhythmias blood pressure improved diastolic slightly elevated at 91 mmHg. Patient's lab and chest x-ray are still pending we'll continue cardiac monitoring. If chest x-ray and troponin is negative and patient is having recurrence of symptoms can be discharged home with followup by her primary care doctor we'll add a CT of the head to a value weight her history of vertigo. No current vertigo and patient can followup with her primary care doctor for that. Patient labs will be reviewed by the a.m. Emergency physician Dr. Adriana Simas.        Shelda Jakes, MD 11/20/11 [phone]

## 2011-12-07 ENCOUNTER — Ambulatory Visit (INDEPENDENT_AMBULATORY_CARE_PROVIDER_SITE_OTHER): Payer: Medicare Other | Admitting: Family Medicine

## 2011-12-07 ENCOUNTER — Encounter: Payer: Self-pay | Admitting: Family Medicine

## 2011-12-07 VITALS — BP 170/100 | HR 111 | Resp 16

## 2011-12-07 DIAGNOSIS — I10 Essential (primary) hypertension: Secondary | ICD-10-CM

## 2011-12-07 DIAGNOSIS — N760 Acute vaginitis: Secondary | ICD-10-CM

## 2011-12-07 DIAGNOSIS — R5383 Other fatigue: Secondary | ICD-10-CM

## 2011-12-07 DIAGNOSIS — F411 Generalized anxiety disorder: Secondary | ICD-10-CM

## 2011-12-07 DIAGNOSIS — R5382 Chronic fatigue, unspecified: Secondary | ICD-10-CM

## 2011-12-07 DIAGNOSIS — F3289 Other specified depressive episodes: Secondary | ICD-10-CM

## 2011-12-07 DIAGNOSIS — E785 Hyperlipidemia, unspecified: Secondary | ICD-10-CM

## 2011-12-07 DIAGNOSIS — R5381 Other malaise: Secondary | ICD-10-CM

## 2011-12-07 DIAGNOSIS — IMO0001 Reserved for inherently not codable concepts without codable children: Secondary | ICD-10-CM

## 2011-12-07 DIAGNOSIS — B370 Candidal stomatitis: Secondary | ICD-10-CM

## 2011-12-07 DIAGNOSIS — R002 Palpitations: Secondary | ICD-10-CM

## 2011-12-07 DIAGNOSIS — F329 Major depressive disorder, single episode, unspecified: Secondary | ICD-10-CM

## 2011-12-07 MED ORDER — LISINOPRIL 10 MG PO TABS: 10.0000 mg | ORAL_TABLET | Freq: Two times a day (BID) | ORAL | Status: DC

## 2011-12-07 MED ORDER — ALPRAZOLAM 0.25 MG PO TABS: ORAL_TABLET | ORAL | Status: DC

## 2011-12-07 MED ORDER — FLUCONAZOLE 100 MG PO TABS: 100.0000 mg | ORAL_TABLET | Freq: Every day | ORAL | Status: AC

## 2011-12-07 MED ORDER — CLOTRIMAZOLE-BETAMETHASONE 1-0.05 % EX CREA: TOPICAL_CREAM | CUTANEOUS | Status: AC

## 2011-12-07 NOTE — Patient Instructions (Signed)
Lisinopril increased to 10mg  twice a day, hold for systolic (top number)  blood pressure less than 90  Diflucan daily for 1 week Cream for outer skin irritation Call me if you would like to see cardiologist I will try to get a nurse to your house F/U 6 months

## 2011-12-07 NOTE — Progress Notes (Signed)
  Subjective:    Patient ID: Pamela Vasquez, female    DOB: 05-08-1951, 61 y.o.   MRN: [id number]  HPI Pt here to f/u ED visit for chest pain, EMS was called the patient began to have recurrent chest pain her blood pressure was noted to be elevated. She was ruled out in the ER. She does have palpitations from time to time however does not think that she did sustain any cardiology workup and that she could get to any this is because she is so fatigued.  HTN- her blood pressure appears to be very labile at home she gives me very good readings ranging from 120-150 systolic over 70 to 80s. She typically takes her blood pressure at 5 AM and then again in the evening between 3 and 4 PM.  Chronic Fatigue- She spends most of her day in the bed he does have a home health nurse which she pays for out of pocket that comes for 4 hours a day to help her. She uses a scooter to get around in her home. Vaginal irritation - she has vaginal irritation which is chronic, she has recurrent vaginal discharge does well as thrush. She tries to keep herself clean and uses pads on the bed   Review of Systems   GEN- +fatigue, fever, weight loss,weakness, recent illness HEENT- denies eye drainage, change in vision, nasal discharge, CVS- denies chest pain,+ palpitations RESP- denies SOB, cough, wheeze ABD- denies N/V, change in stools, abd pain GU- denies dysuria, hematuria, dribbling, incontinence MSK- +joint pain, + muscle aches, injury Neuro- +headache, dizziness, syncope, seizure activity      Objective:   Physical Exam GEN- NAD, alert and oriented x3, sitting in wheelchair, fatigued appearing, repeat BP 160/100 HEENT- PERRL, EOMI, non injected sclera, MMM, thrush Neck- Supple, no LAD  CVS- Tachycardic, no murmur RESP-CTAB ABD, NABS,soft,NT,ND  EXT- No edema Pulses- Radial, DP- 2+ Psych- depressed, flat affect, normal speech  not overly anxious appearing GU- erythema over labia majora and inguinal creases,  thin tissue, thick white discharge in crevices and in vault, no cervix seen       Assessment & Plan:  She declines regular office visits as well as preventative care stating that she cannot into her any testing or try to get to multiple visits

## 2011-12-08 ENCOUNTER — Encounter: Payer: Self-pay | Admitting: Family Medicine

## 2011-12-08 DIAGNOSIS — N760 Acute vaginitis: Secondary | ICD-10-CM | POA: Insufficient documentation

## 2011-12-08 DIAGNOSIS — R002 Palpitations: Secondary | ICD-10-CM | POA: Insufficient documentation

## 2011-12-08 DIAGNOSIS — E785 Hyperlipidemia, unspecified: Secondary | ICD-10-CM | POA: Insufficient documentation

## 2011-12-08 DIAGNOSIS — B370 Candidal stomatitis: Secondary | ICD-10-CM | POA: Insufficient documentation

## 2011-12-08 NOTE — Assessment & Plan Note (Signed)
Diflucan per above

## 2011-12-08 NOTE — Assessment & Plan Note (Signed)
Treatment for yeast  will also give her Lotrisone for the irritation in the groin region this is probably due to the event areas as well as yeast. Culture take

## 2011-12-08 NOTE — Assessment & Plan Note (Signed)
Increase lisinopril to 10 mg twice a day patient will call for systolic less than 90.

## 2011-12-08 NOTE — Assessment & Plan Note (Signed)
Her fibromyalgia and weakness has completely taken over her life. She is basically bed ridden at this time and has little motivation to try to improve this

## 2011-12-08 NOTE — Assessment & Plan Note (Signed)
Still no treatment for this simply she has very severe depression and anxiety. She does continue her Xanax otherwise leads a very recluse lifestyle.

## 2011-12-08 NOTE — Assessment & Plan Note (Signed)
unchanged

## 2011-12-08 NOTE — Assessment & Plan Note (Signed)
Pt intolerant of any meds

## 2011-12-08 NOTE — Assessment & Plan Note (Signed)
She is very elevated cholesterol and does not tolerate statin therapy. She also has hypertension which is uncontrolled she is having these palpitations which could signify cardiac disease however she does not want workup and understands that we cannot give her any particular treatment without workup. She is very weary to change any medications.

## 2011-12-08 NOTE — Assessment & Plan Note (Signed)
Unchanged. Approximately 30 minutes spent on this it with patient. Note she was able to stand to get herself dressed as well as to ambulate up onto the exam table

## 2011-12-09 LAB — WET PREP BY MOLECULAR PROBE
Candida species: NEGATIVE
Trichomonas vaginosis: NEGATIVE

## 2011-12-10 ENCOUNTER — Telehealth: Payer: Self-pay

## 2011-12-10 MED ORDER — ESTRADIOL 0.1 MG/GM VA CREA: 1.0000 g | TOPICAL_CREAM | Freq: Every day | VAGINAL | Status: AC

## 2011-12-10 NOTE — Telephone Encounter (Signed)
Pt aware.

## 2011-12-10 NOTE — Telephone Encounter (Signed)
Please advise pt she can pick up over the counter probiotics Trial of estrogen topical sent , she is to follow instructions on prescription

## 2012-01-11 ENCOUNTER — Emergency Department (HOSPITAL_COMMUNITY)
Admission: EM | Admit: 2012-01-11 | Discharge: 2012-01-11 | Disposition: A | Discharge status: Home / Self Care | Payer: Medicare Other | Attending: Emergency Medicine | Admitting: Emergency Medicine

## 2012-01-11 ENCOUNTER — Telehealth: Payer: Self-pay | Admitting: Family Medicine

## 2012-01-11 ENCOUNTER — Other Ambulatory Visit: Payer: Self-pay

## 2012-01-11 ENCOUNTER — Encounter (HOSPITAL_COMMUNITY): Payer: Self-pay | Admitting: Emergency Medicine

## 2012-01-11 DIAGNOSIS — I10 Essential (primary) hypertension: Secondary | ICD-10-CM | POA: Insufficient documentation

## 2012-01-11 DIAGNOSIS — R5381 Other malaise: Secondary | ICD-10-CM | POA: Insufficient documentation

## 2012-01-11 DIAGNOSIS — F329 Major depressive disorder, single episode, unspecified: Secondary | ICD-10-CM | POA: Insufficient documentation

## 2012-01-11 DIAGNOSIS — I498 Other specified cardiac arrhythmias: Secondary | ICD-10-CM | POA: Insufficient documentation

## 2012-01-11 DIAGNOSIS — F3289 Other specified depressive episodes: Secondary | ICD-10-CM | POA: Insufficient documentation

## 2012-01-11 DIAGNOSIS — IMO0002 Reserved for concepts with insufficient information to code with codable children: Secondary | ICD-10-CM | POA: Insufficient documentation

## 2012-01-11 DIAGNOSIS — K219 Gastro-esophageal reflux disease without esophagitis: Secondary | ICD-10-CM | POA: Insufficient documentation

## 2012-01-11 DIAGNOSIS — R5383 Other fatigue: Secondary | ICD-10-CM | POA: Insufficient documentation

## 2012-01-11 MED ORDER — ACETAMINOPHEN 325 MG PO TABS
650.0000 mg | ORAL_TABLET | Freq: Once | ORAL | Status: AC
  Administered 2012-01-11: 650 mg via ORAL
  Filled 2012-01-11: qty 1

## 2012-01-11 NOTE — ED Provider Notes (Signed)
History  This chart was scribed for Joya Gaskins, MD by Bennett Scrape. This patient was seen in room APA05/APA05 and the patient's care was started at 7:15AM.  CSN: [id number]  Arrival date & time 01/11/12  0715   First MD Initiated Contact with Patient 01/11/12 0715      Chief Complaint  Patient presents with  . Hypertension    Patient is a 61 y.o. female presenting with hypertension. The history is provided by the patient. No language interpreter was used.  Hypertension This is a recurrent problem. The current episode started 1 to 2 hours ago. The problem occurs constantly. The problem has not changed since onset.Pertinent negatives include no chest pain, no abdominal pain, no headaches and no shortness of breath. Nothing aggravates the symptoms. Nothing relieves the symptoms.    Pamela Vasquez is a 61 y.o. female with a h/o HTN and chronic fatigue who presents to the Emergency Department complaining of 2 hours of HTN of 179/94 with associated increased generalized weakness/fatigue and palpitations that she noticed this morning. She reports that the HTN symptoms are usually the most severe in the mornings. Pt states that at baseline she has generalized weakness but states that today it is more severe than normal. She c/o mild left arm discomfort; however, she denies loss of function in her extremities. She reports taking 10 mg of lisinopril this morning but has not re-measured her BP PTA. BP was measured at 175/102 in the ED. She was also seen in this ED in June 2013 for the same, had a negative chest x-ray and CT scan and was discharged home with instructions to follow up with PCP. She reports that she saw her PCP on June 25th, 2013 and was told to increase the lisinopril from 5 mg twice a day to 10 mg twice a day. She reports that her BP, which she measures twice a take, has been high over the past 2 days measuring around 136/82. She attributes this to the increase in lisinopril  dosage, because she states that she was having good BP readings around 116/69 with taking 5 mg twice a day. However, she also reports having a prior episode of HTN last week which improved with taking 10 mg of lisinopril. She denies having any prior MI or CVA. She also c/o right hand pain attributed to arthritis and states that the pain has become more severe over the past 12 hours due to increased usage operating her mobility scooter. She denies fever, sore throat, visual disturbance, CP, SOB, abdominal pain, nausea, emesis, diarrhea, urinary symptoms, back pain, HA, numbness and rash as associated symptoms. She also has a h/o fibromyalgia, IBS and depression. She denies smoking and alcohol use.  Dr. Jeanice Lim is PCP.   Past Medical History  Diagnosis Date  . Arthritis   . Hypertension   . Fibromyalgia   . Chronic fatigue   . Acid reflux   . Degenerative disc disease   . Irritable bowel syndrome (IBS)   . Interstitial cystitis   . Rheumatic fever   . Depression   . Blepharitis     Recurrent  . Vertigo     Past Surgical History  Procedure Date  . Knee arthroplasty   . Carpal tunnel release   . Wrist arthroplasty   . Cholecystectomy   . Oophorectomy   . Abdominal hysterectomy     Family History  Problem Relation Age of Onset  . Osteoarthritis Mother   . Heart disease Mother   .  Hyperlipidemia Mother   . Cancer Father     colon  . Hyperlipidemia Father   . Heart disease Father   . Rheum arthritis Brother   . Osteoarthritis Brother   . Depression Brother   . Heart disease Brother   . Hyperlipidemia Brother   . Cancer Other   . Depression Sister   . Heart disease Sister   . Hyperlipidemia Sister     History  Substance Use Topics  . Smoking status: Never Smoker   . Smokeless tobacco: Not on file  . Alcohol Use: No    OB History    Grav Para Term Preterm Abortions TAB SAB Ect Mult Living   0               Review of Systems  Respiratory: Negative for shortness  of breath.   Cardiovascular: Positive for palpitations. Negative for chest pain.  Gastrointestinal: Negative for abdominal pain.  Musculoskeletal: Negative for back pain.  Neurological: Positive for weakness. Negative for headaches.  All other systems reviewed and are negative.    Allergies  Aspirin; Codeine; Demerol; and Nsaids  Home Medications   Current Outpatient Rx  Name Route Sig Dispense Refill  . ACETAMINOPHEN 500 MG PO TABS Oral Take 250-500 mg by mouth every 6 (six) hours as needed. For pain or headaches    . ALPRAZOLAM 0.25 MG PO TABS  Take 1 tablet three times a day as needed for anxiety 60 tablet 1  . REFRESH P.M. OP OINT Both Eyes Place 1 drop into both eyes as needed. For dry eyes    . CLOTRIMAZOLE-BETAMETHASONE 1-0.05 % EX CREA  Apply to affected area 2 times daily 15 g 3  . ESOMEPRAZOLE MAGNESIUM 40 MG PO CPDR Oral Take 1 capsule (40 mg total) by mouth 2 (two) times daily. 60 capsule 3  . ESTRADIOL 0.1 MG/GM VA CREA Vaginal Place 0.25 Applicatorfuls vaginally daily. For 1 week  then q weekly 42.5 g 3  . LISINOPRIL 10 MG PO TABS Oral Take 1 tablet (10 mg total) by mouth 2 (two) times daily. 60 tablet 3  . NYSTATIN 100000 UNIT/ML MT SUSP Oral Take 500,000 Units by mouth 4 (four) times daily.      Triage Vitals: BP 175/102  Pulse 107  Temp 98.9 F (37.2 C) (Oral)  Resp 20  Ht 5\' 4"  (1.626 m)  Wt 146 lb (66.225 kg)  BMI 25.06 kg/m2  SpO2 94%  Physical Exam  Nursing note and vitals reviewed.  CONSTITUTIONAL: Well developed/well nourished HEAD AND FACE: Normocephalic/atraumatic EYES: EOMI/PERRL ENMT: Mucous membranes moist NECK: supple no meningeal signs SPINE:entire spine nontender CV: S1/S2 noted, no murmurs/rubs/gallops noted LUNGS: Lungs are clear to auscultation bilaterally, no apparent distress ABDOMEN: soft, nontender, no rebound or guarding GU:no cva tenderness NEURO: Pt is awake/alert, moves all extremitiesx4, no arm or leg drift is noted, no  facial droop  EXTREMITIES: pulses normal, full ROM SKIN: warm, color normal PSYCH: flat affect  ED Course  Procedures   DIAGNOSTIC STUDIES: Oxygen Saturation is 94% on room air, adequate by my interpretation.    COORDINATION OF CARE: 7:28AM-Discussed treatment plan with pt at bedside and pt agreed to plan. 8:12AM-Pt rechecked and is tearful. She c/o chronic back pain and is requesting one 325 mg apa. Informed pt that EKG is normal and that she is clinically stable enough to be discharged home. Discussed consult with Dr. Jeanice Lim and pt agreed.  D/w dr Jeanice Lim, she reports that patient will sometimes not  take her BP meds.  Advised for her to continue lisinopril BID, and dr Jeanice Lim will f/u with patient today by phone for further med instructions  At this point patient is in no distress, no active CP/SOB, no focal weakness, no severe HA reported.  She is nontoxic.  I doubt hypertensive emergency at this time.  I doubt PE/Dissection at this time   Pt has chronic depression, and has flat affect but is at baseline and feels safe for d/c home   MDM  Nursing notes including past medical history and social history reviewed and considered in documentation Previous records reviewed and considered - previous BP readings reviewed    Date: 01/11/2012  Rate: 102  Rhythm: sinus tachycardia  QRS Axis: normal  Intervals: normal  ST/T Wave abnormalities: normal  Conduction Disutrbances:none  Narrative Interpretation:   Old EKG Reviewed: unchanged        I personally performed the services described in this documentation, which was scribed in my presence. The recorded information has been reviewed and considered.         Joya Gaskins, MD 01/11/12 [phone]

## 2012-01-11 NOTE — Telephone Encounter (Signed)
Left voice message for pt to return call, I would like to know what her blood pressure is, seen in ED She should take the lisinopril 10mg  BID

## 2012-01-11 NOTE — ED Notes (Signed)
Pt states woke up this am with feeling of her blood pressure being elevated. Pt having left arm discomfort and right index finger.

## 2012-01-12 ENCOUNTER — Telehealth: Payer: Self-pay | Admitting: Family Medicine

## 2012-01-12 NOTE — Telephone Encounter (Signed)
Pt aware that she needs to take lisinopril bid.  Referral made to [facility].  Pt states that she will call back on 8/1 to schedule appt.

## 2012-01-12 NOTE — Telephone Encounter (Signed)
Pt aware.

## 2012-01-12 NOTE — Telephone Encounter (Signed)
She needs to continue lisinopril 10mg  twice a day Please put in a referral for [facility] or [facility] to go out to home to check on patient, i need BP readings and medication check.  Please ask her to schedule an office visit in 4 weeks for her BP

## 2012-02-10 ENCOUNTER — Encounter: Payer: Self-pay | Admitting: Family Medicine

## 2012-02-10 ENCOUNTER — Ambulatory Visit (INDEPENDENT_AMBULATORY_CARE_PROVIDER_SITE_OTHER): Payer: Medicare Other | Admitting: Family Medicine

## 2012-02-10 VITALS — BP 160/80 | HR 116 | Resp 18 | Ht 64.0 in

## 2012-02-10 DIAGNOSIS — L0232 Furuncle of buttock: Secondary | ICD-10-CM

## 2012-02-10 DIAGNOSIS — R5381 Other malaise: Secondary | ICD-10-CM

## 2012-02-10 DIAGNOSIS — L0233 Carbuncle of buttock: Secondary | ICD-10-CM

## 2012-02-10 DIAGNOSIS — IMO0001 Reserved for inherently not codable concepts without codable children: Secondary | ICD-10-CM

## 2012-02-10 DIAGNOSIS — F411 Generalized anxiety disorder: Secondary | ICD-10-CM

## 2012-02-10 DIAGNOSIS — R002 Palpitations: Secondary | ICD-10-CM

## 2012-02-10 DIAGNOSIS — F329 Major depressive disorder, single episode, unspecified: Secondary | ICD-10-CM

## 2012-02-10 DIAGNOSIS — I10 Essential (primary) hypertension: Secondary | ICD-10-CM

## 2012-02-10 DIAGNOSIS — F3289 Other specified depressive episodes: Secondary | ICD-10-CM

## 2012-02-10 DIAGNOSIS — R5382 Chronic fatigue, unspecified: Secondary | ICD-10-CM

## 2012-02-10 MED ORDER — METOPROLOL SUCCINATE ER 25 MG PO TB24: ORAL_TABLET | ORAL | Status: DC

## 2012-02-10 MED ORDER — CEPHALEXIN 500 MG PO CAPS: 500.0000 mg | ORAL_CAPSULE | Freq: Two times a day (BID) | ORAL | Status: AC

## 2012-02-10 NOTE — Patient Instructions (Addendum)
Start toprol 1/2 tablet in AM Take 5mg  lisinopril twice a day Warm compresses for boil Antibiotics prescribed  F/U December

## 2012-02-11 ENCOUNTER — Encounter: Payer: Self-pay | Admitting: Family Medicine

## 2012-02-11 DIAGNOSIS — L0232 Furuncle of buttock: Secondary | ICD-10-CM | POA: Insufficient documentation

## 2012-02-11 NOTE — Assessment & Plan Note (Signed)
unchanged

## 2012-02-11 NOTE — Assessment & Plan Note (Signed)
Keflex given, continue warm compresses

## 2012-02-11 NOTE — Assessment & Plan Note (Signed)
Uncontrolled, declines treatment, states she has tried everything and her body does not do well with new medications

## 2012-02-11 NOTE — Assessment & Plan Note (Signed)
As she declines cardiology, will add BB for symptoms and HTN

## 2012-02-11 NOTE — Progress Notes (Signed)
  Subjective:    Patient ID: Pamela Vasquez, female    DOB: Nov 10, 1950, 61 y.o.   MRN: [id number]  HPI  Patient seen and E. in secondary to chest pain and elevated blood pressure. She's taking half of lisinopril at various times throughout the day. She said that she typically totals 15 mg a day at different intervals. She continues to have palpitations with little exertion however declines to see cardiology. She's very fatigued and stays in the bed most of the day. She's also very depressed and recently found out that her ex-husband has now moved on. Her other concern today is a boil on her buttocks for the past 3 weeks. She's been using Neosporin warm compresses and a boil ease however she continues to have pain.    Review of Systems    GEN- +fatigue, fever, weight loss,weakness, recent illness HEENT- denies eye drainage, change in vision, nasal discharge, CVS- denies chest pain,+ palpitations RESP- denies SOB, cough, wheeze ABD- denies N/V, change in stools, abd pain GU- denies dysuria, hematuria, dribbling, incontinence MSK- +joint pain, + muscle aches, injury Neuro- +headache, dizziness, syncope, seizure activity     Objective:   Physical Exam GEN- NAD, alert and oriented x3, sitting in wheelchair, fatigued appearing, HEENT- PERRL, EOMI, non injected sclera, MMM,  Neck- Supple, no LAD  CVS- Tachycardic, no murmur RESP-CTAB ABD, NABS,soft,NT,ND  EXT- No edema Skin- small dime size boil in gluteal crease- right side, no fluctuance, mild erythema Pulses- Radial, DP- 2+ Psych- depressed, flat affect, normal speech  not overly anxious appearing         Assessment & Plan:

## 2012-02-11 NOTE — Assessment & Plan Note (Signed)
Uncontrolled, plan to decrease lisinopril to total 10mg  daily, add 12.5mg  of toprol, pt is in agreement with this

## 2012-02-11 NOTE — Assessment & Plan Note (Signed)
Unchanged, she would benefit from therapy but declines

## 2012-02-11 NOTE — Assessment & Plan Note (Signed)
Unchanged, she is not willing to pursue treatment for depression, or try to increase activity or join social groups

## 2012-02-17 ENCOUNTER — Telehealth: Payer: Self-pay | Admitting: Family Medicine

## 2012-02-17 ENCOUNTER — Other Ambulatory Visit: Payer: Self-pay | Admitting: Family Medicine

## 2012-02-17 MED ORDER — FLUCONAZOLE 150 MG PO TABS: ORAL_TABLET | ORAL | Status: AC

## 2012-02-17 NOTE — Telephone Encounter (Signed)
Fluconazole #2 prescribed, and pt is aware

## 2012-02-17 NOTE — Telephone Encounter (Signed)
Please advise 

## 2012-02-24 ENCOUNTER — Telehealth: Payer: Self-pay | Admitting: Family Medicine

## 2012-02-25 MED ORDER — ALPRAZOLAM 0.25 MG PO TABS: ORAL_TABLET | ORAL | Status: DC

## 2012-02-25 NOTE — Telephone Encounter (Signed)
Refilled

## 2012-02-28 ENCOUNTER — Other Ambulatory Visit: Payer: Self-pay | Admitting: Family Medicine

## 2012-03-07 ENCOUNTER — Other Ambulatory Visit: Payer: Self-pay | Admitting: Family Medicine

## 2012-03-08 ENCOUNTER — Telehealth: Payer: Self-pay | Admitting: Family Medicine

## 2012-03-08 MED ORDER — NYSTATIN 100000 UNIT/ML MT SUSP: 500000.0000 [IU] | Freq: Four times a day (QID) | OROMUCOSAL | Status: DC

## 2012-03-08 NOTE — Telephone Encounter (Signed)
I don't see where this was sent in the first time. Ok to refill this med. If so, please provide directions. Thanks

## 2012-03-08 NOTE — Telephone Encounter (Signed)
Med sent.

## 2012-04-21 ENCOUNTER — Other Ambulatory Visit: Payer: Self-pay | Admitting: Family Medicine

## 2012-04-27 ENCOUNTER — Other Ambulatory Visit: Payer: Self-pay | Admitting: Family Medicine

## 2012-05-04 ENCOUNTER — Emergency Department (HOSPITAL_COMMUNITY)
Admission: EM | Admit: 2012-05-04 | Discharge: 2012-05-04 | Disposition: A | Discharge status: Home / Self Care | Payer: Medicare Other | Attending: Emergency Medicine | Admitting: Emergency Medicine

## 2012-05-04 ENCOUNTER — Other Ambulatory Visit: Payer: Self-pay

## 2012-05-04 ENCOUNTER — Encounter (HOSPITAL_COMMUNITY): Payer: Self-pay | Admitting: Emergency Medicine

## 2012-05-04 DIAGNOSIS — M129 Arthropathy, unspecified: Secondary | ICD-10-CM | POA: Insufficient documentation

## 2012-05-04 DIAGNOSIS — Z8679 Personal history of other diseases of the circulatory system: Secondary | ICD-10-CM | POA: Insufficient documentation

## 2012-05-04 DIAGNOSIS — IMO0002 Reserved for concepts with insufficient information to code with codable children: Secondary | ICD-10-CM | POA: Insufficient documentation

## 2012-05-04 DIAGNOSIS — K219 Gastro-esophageal reflux disease without esophagitis: Secondary | ICD-10-CM | POA: Insufficient documentation

## 2012-05-04 DIAGNOSIS — I10 Essential (primary) hypertension: Secondary | ICD-10-CM | POA: Insufficient documentation

## 2012-05-04 DIAGNOSIS — R002 Palpitations: Secondary | ICD-10-CM | POA: Insufficient documentation

## 2012-05-04 DIAGNOSIS — F3289 Other specified depressive episodes: Secondary | ICD-10-CM | POA: Insufficient documentation

## 2012-05-04 DIAGNOSIS — F329 Major depressive disorder, single episode, unspecified: Secondary | ICD-10-CM | POA: Insufficient documentation

## 2012-05-04 DIAGNOSIS — H01009 Unspecified blepharitis unspecified eye, unspecified eyelid: Secondary | ICD-10-CM | POA: Insufficient documentation

## 2012-05-04 DIAGNOSIS — Z79899 Other long term (current) drug therapy: Secondary | ICD-10-CM | POA: Insufficient documentation

## 2012-05-04 DIAGNOSIS — IMO0001 Reserved for inherently not codable concepts without codable children: Secondary | ICD-10-CM | POA: Insufficient documentation

## 2012-05-04 DIAGNOSIS — R5381 Other malaise: Secondary | ICD-10-CM | POA: Insufficient documentation

## 2012-05-04 DIAGNOSIS — R42 Dizziness and giddiness: Secondary | ICD-10-CM | POA: Insufficient documentation

## 2012-05-04 DIAGNOSIS — B370 Candidal stomatitis: Secondary | ICD-10-CM

## 2012-05-04 MED ORDER — NYSTATIN 100000 UNIT/ML MT SUSP: 500000.0000 [IU] | Freq: Four times a day (QID) | OROMUCOSAL | Status: DC

## 2012-05-04 MED ORDER — LORAZEPAM 1 MG PO TABS
0.5000 mg | ORAL_TABLET | Freq: Once | ORAL | Status: AC
  Administered 2012-05-04: 0.5 mg via ORAL
  Filled 2012-05-04: qty 1

## 2012-05-04 NOTE — ED Provider Notes (Signed)
History   This chart was scribed for Joya Gaskins, MD by Gerlean Ren, ED Scribe. This patient was seen in room APA14/APA14 and the patient's care was started at 9:12 AM    CSN: [id number]  Arrival date & time 05/04/12  4098   First MD Initiated Contact with Patient 05/04/12 0847      Chief Complaint  Patient presents with  . Hypertension     The history is provided by the patient. No language interpreter was used.   Pamela Vasquez is a 61 y.o. female with h/o HTN brought in by ambulance to the Emergency Department complaining of irregular BP readings this morning.  Pt states she took lisinopril at 5:00AM and measured BP at 103/70 with HR at 82 at 7:00AM, and another reading shortly after with increased BP and palpitations. She called nurse hotline and was told to go the ED due to labile BP and having her chronic weakness/fatigue   Pt reports minor light-headedness before initial BP check but denies any syncope, dizziness, HA, chest pain, dyspnea, vision loss, unusual fatigue, abdominal pain, and emesis prior to first check or since. Pt denies tobacco and alcohol use.  PCP is Dr. Jeanice Lim.  Past Medical History  Diagnosis Date  . Arthritis   . Hypertension   . Fibromyalgia   . Chronic fatigue   . Acid reflux   . Degenerative disc disease   . Irritable bowel syndrome (IBS)   . Interstitial cystitis   . Rheumatic fever   . Depression   . Blepharitis     Recurrent  . Vertigo     Past Surgical History  Procedure Date  . Knee arthroplasty   . Carpal tunnel release   . Wrist arthroplasty   . Cholecystectomy   . Oophorectomy   . Abdominal hysterectomy     Family History  Problem Relation Age of Onset  . Osteoarthritis Mother   . Heart disease Mother   . Hyperlipidemia Mother   . Cancer Father     colon  . Hyperlipidemia Father   . Heart disease Father   . Rheum arthritis Brother   . Osteoarthritis Brother   . Depression Brother   . Heart disease Brother   .  Hyperlipidemia Brother   . Cancer Other   . Depression Sister   . Heart disease Sister   . Hyperlipidemia Sister     History  Substance Use Topics  . Smoking status: Never Smoker   . Smokeless tobacco: Not on file  . Alcohol Use: No    OB History    Grav Para Term Preterm Abortions TAB SAB Ect Mult Living   0               Review of Systems  Constitutional: Negative for fatigue.  Eyes: Negative for visual disturbance.  Respiratory: Negative for shortness of breath.   Cardiovascular: Positive for palpitations. Negative for chest pain.  Gastrointestinal: Negative for vomiting and abdominal pain.  Neurological: Positive for light-headedness. Negative for syncope and headaches.    Allergies  Aspirin; Codeine; Demerol; and Nsaids  Home Medications   Current Outpatient Rx  Name  Route  Sig  Dispense  Refill  . ACETAMINOPHEN 500 MG PO TABS   Oral   Take 250-500 mg by mouth every 6 (six) hours as needed. For pain or headaches         . REFRESH P.M. OP OINT   Both Eyes   Place 1 drop into both eyes  as needed. For dry eyes         . CLOTRIMAZOLE-BETAMETHASONE 1-0.05 % EX CREA      Apply to affected area 2 times daily   15 g   3   . DIFLUCAN 150 MG PO TABS      TAKE 1 TABLET BY MOUTH ONCE DAILY AS NEEDED.   2 tablet   0   . ESTRADIOL 0.1 MG/GM VA CREA   Vaginal   Place 0.25 Applicatorfuls vaginally daily. For 1 week  then q weekly   42.5 g   3   . METOPROLOL SUCCINATE ER 25 MG PO TB24      Take 1/2 tablet daily   30 tablet   3   . NEXIUM 40 MG PO CPDR      TAKE 1 CAPSULE BY MOUTH TWICE DAILY.   60 capsule   2   . NYSTATIN 100000 UNIT/ML MT SUSP   Oral   Take 5 mLs (500,000 Units total) by mouth 4 (four) times daily.   240 mL   1   . PRINIVIL 10 MG PO TABS      TAKE (1) TABLET BY MOUTH TWICE DAILY.   60 tablet   3   . XANAX 0.25 MG PO TABS      TAKE ONE TABLET BY MOUTH 3 TIMES DAILY AS NEEDED FOR ANXIETY.   60 tablet   2     BP  179/107  Pulse 116  Resp 20  Ht 5\' 4"  (1.626 m)  Wt 150 lb (68.04 kg)  BMI 25.75 kg/m2  SpO2 94%  Physical Exam CONSTITUTIONAL: Well developed/well nourished HEAD AND FACE: Normocephalic/atraumatic EYES: EOMI/PERRL ENMT: Mucous membranes moist, thrush noted on tongue NECK: supple no meningeal signs SPINE:entire spine nontender CV: S1/S2 noted, no murmurs/rubs/gallops noted LUNGS: Lungs are clear to auscultation bilaterally, no apparent distress ABDOMEN: soft, nontender, no rebound or guarding GU:no cva tenderness NEURO: Pt is awake/alert, moves all extremitiesx4, no arm or leg drift, face is symmetric. EXTREMITIES: pulses normal, full ROM SKIN: warm, color normal PSYCH: no abnormalities of mood noted  ED Course  Procedures  DIAGNOSTIC STUDIES: Oxygen Saturation is 94% on room air, adequate by my interpretation.    COORDINATION OF CARE: 9:22 AM- Patient informed of clinical course, understands medical decision-making process, and agrees with plan.  Ordered EKG.  D/w her PCP - she knows patient well.  She request that we ensure that patient is taking metoprolol.  Pt reports she has not taken this due to concern for low BP.  Advised that metoprolol will improve her tachycardia.   For her thrush, will restart nystatin and her PCP will continue evaluation and workup for this.  Pt reports it has occurred previously while taking antibiotics.  She requests dose of ativan for her "nerves" though pt is not severely anxious.    MDM  Nursing notes including past medical history and social history reviewed and considered in documentation       Date: 05/04/2012  Rate: 111  Rhythm: sinus tachycardia  QRS Axis: normal  Intervals: normal  ST/T Wave abnormalities: nonspecific ST changes  Conduction Disutrbances:none  Narrative Interpretation:   Old EKG Reviewed: unchanged    I personally performed the services described in this documentation, which was scribed in my presence. The  recorded information has been reviewed and is accurate.            Joya Gaskins, MD 05/04/12 1003

## 2012-05-04 NOTE — ED Notes (Signed)
Pt states her blood pressure is all over the board from low to high.

## 2012-05-04 NOTE — ED Notes (Signed)
Patient requesting to take half a tylenol she had brought from home, approved by EDP. Patient also requesting some ativan to "calm her nerves." EDP made aware-no orders given at this time.

## 2012-05-08 ENCOUNTER — Telehealth: Payer: Self-pay | Admitting: Family Medicine

## 2012-05-08 DIAGNOSIS — Z139 Encounter for screening, unspecified: Secondary | ICD-10-CM

## 2012-05-08 DIAGNOSIS — B370 Candidal stomatitis: Secondary | ICD-10-CM

## 2012-05-08 DIAGNOSIS — I10 Essential (primary) hypertension: Secondary | ICD-10-CM

## 2012-05-08 DIAGNOSIS — E785 Hyperlipidemia, unspecified: Secondary | ICD-10-CM

## 2012-05-08 DIAGNOSIS — R5382 Chronic fatigue, unspecified: Secondary | ICD-10-CM

## 2012-05-08 NOTE — Telephone Encounter (Signed)
Pt will need FLP, A1C,CBC, and HIV test done for recurrent thrush   Please advise pt to keep appt in December for her blood pressure, come fasting to appointment so that labs can be done the same day,

## 2012-05-10 NOTE — Addendum Note (Signed)
Addended by: Abner Greenspan on: 05/10/2012 11:00 AM   Modules accepted: Orders

## 2012-05-10 NOTE — Telephone Encounter (Signed)
Labs ordered.

## 2012-05-10 NOTE — Telephone Encounter (Signed)
Called patient. No answer, no option to leave message

## 2012-05-16 ENCOUNTER — Other Ambulatory Visit: Payer: Self-pay

## 2012-05-16 MED ORDER — ALPRAZOLAM 0.25 MG PO TABS: 0.2500 mg | ORAL_TABLET | Freq: Three times a day (TID) | ORAL | Status: DC

## 2012-05-16 NOTE — Telephone Encounter (Signed)
Called pt again- no answer and no option to leave message

## 2012-05-30 ENCOUNTER — Ambulatory Visit (INDEPENDENT_AMBULATORY_CARE_PROVIDER_SITE_OTHER): Payer: Medicare Other | Admitting: Family Medicine

## 2012-05-30 ENCOUNTER — Encounter: Payer: Self-pay | Admitting: Family Medicine

## 2012-05-30 VITALS — BP 152/92 | HR 110 | Resp 18 | Ht 64.0 in

## 2012-05-30 DIAGNOSIS — F411 Generalized anxiety disorder: Secondary | ICD-10-CM

## 2012-05-30 DIAGNOSIS — B370 Candidal stomatitis: Secondary | ICD-10-CM

## 2012-05-30 DIAGNOSIS — R002 Palpitations: Secondary | ICD-10-CM

## 2012-05-30 DIAGNOSIS — F329 Major depressive disorder, single episode, unspecified: Secondary | ICD-10-CM

## 2012-05-30 DIAGNOSIS — R5381 Other malaise: Secondary | ICD-10-CM

## 2012-05-30 DIAGNOSIS — R5382 Chronic fatigue, unspecified: Secondary | ICD-10-CM

## 2012-05-30 DIAGNOSIS — I10 Essential (primary) hypertension: Secondary | ICD-10-CM

## 2012-05-30 DIAGNOSIS — F3289 Other specified depressive episodes: Secondary | ICD-10-CM

## 2012-05-30 DIAGNOSIS — R5383 Other fatigue: Secondary | ICD-10-CM

## 2012-05-30 MED ORDER — FLUCONAZOLE 150 MG PO TABS: 150.0000 mg | ORAL_TABLET | Freq: Every day | ORAL | Status: DC

## 2012-05-30 NOTE — Patient Instructions (Signed)
Take the diflucan for the thrush  Get the labs done within the next 2 weeks Take 1/2 tablet twice a day of lisinopril  Take 1/2 tablet metoprolol twice a day  Call with blood pressure readings F/U 4 months

## 2012-05-31 ENCOUNTER — Encounter: Payer: Self-pay | Admitting: Family Medicine

## 2012-05-31 NOTE — Assessment & Plan Note (Signed)
Chronic fatigue and fibromyalgia and depression, have caused her to withdraw from society and stay in bed, she has a very interesting schedule where she wakes at 3am and goes back to bed by 5pm, she has an aide but declines any further help mentally. She is able to ambulate without difficulty in the office, but relies on wheelchair and scooter in her home

## 2012-05-31 NOTE — Progress Notes (Signed)
  Subjective:    Patient ID: Pamela Vasquez, female    DOB: Feb 19, 1951, 61 y.o.   MRN: [id number]  HPI Pt here to f/u hypertension and ER visit, states she woke up day of ER visit and her BP was low at 103/70 she became afraid so she called the nurse hotline and then her BP elevated and her pulse  Went up because of anxiety and she was sent to the ER. Her home BP readings are 120-140/60-70, she is taking lisinopril 10mg  BID but states her body cant take the toprol as she is so tired. Then states she does not know when to take the toprol   Review of Systems   GEN- denies fatigue, fever, weight loss,weakness, recent illness HEENT- denies eye drainage, change in vision, nasal discharge, CVS- denies chest pain,+ palpitations RESP- denies SOB, cough, wheeze ABD- denies N/V, change in stools, abd pain GU- denies dysuria, hematuria, dribbling, incontinence MSK- + joint pain, +muscle aches, injury Neuro- denies headache, dizziness, syncope, seizure activity      Objective:   Physical Exam GEN- NAD, alert and oriented x3, sitting in wheelchair,but able to walk to exam table and restroom HEENT- PERRL, EOMI, non injected sclera, MMM, +mild thrush Neck- Supple, no LAD  CVS- Tachycardic, no murmur RESP-CTAB ABD, NABS,soft,NT,ND  EXT- No edema Pulses- Radial, DP- 2+ Psych- depressed, flat affect, normal speech  Not anxious ,speaks slowly        Assessment & Plan:    I have advised her , i can not properly care for her without routine visits and her willingness to participate in treatment

## 2012-05-31 NOTE — Assessment & Plan Note (Signed)
Unchanged, per above

## 2012-05-31 NOTE — Assessment & Plan Note (Signed)
She continues to decline cardiology, would benefit from event monitor, she agrees to take Toprol , note father diagnosed with Afib

## 2012-05-31 NOTE — Assessment & Plan Note (Signed)
Her home readings are always reasonable but in any medical setting severely elevated,  I will decrease her lisinopril to 5mg  BID and add torpol 12.5mg  BID, she agrees to this regimen

## 2012-05-31 NOTE — Assessment & Plan Note (Signed)
Recurrent thrush, check A1C, HIV, baseline labs Diflucan tabs daily x 1 week

## 2012-05-31 NOTE — Assessment & Plan Note (Signed)
Unchanged, continues use of benzo

## 2012-06-13 LAB — CBC WITH DIFFERENTIAL/PLATELET
Basophils Absolute: 0 10*3/uL (ref 0.0–0.1)
Eosinophils Relative: 3 % (ref 0–5)
HCT: 40.4 % (ref 36.0–46.0)
Lymphocytes Relative: 42 % (ref 12–46)
Lymphs Abs: 3.2 10*3/uL (ref 0.7–4.0)
MCV: 87.1 fL (ref 78.0–100.0)
Neutro Abs: 3.4 10*3/uL (ref 1.7–7.7)
Platelets: 335 10*3/uL (ref 150–400)
RBC: 4.64 MIL/uL (ref 3.87–5.11)
RDW: 13.7 % (ref 11.5–15.5)
WBC: 7.5 10*3/uL (ref 4.0–10.5)

## 2012-06-13 LAB — LIPID PANEL
Cholesterol: 308 mg/dL — ABNORMAL HIGH (ref 0–200)
Triglycerides: 565 mg/dL — ABNORMAL HIGH (ref <150)

## 2012-06-14 LAB — HEMOGLOBIN A1C: Mean Plasma Glucose: 117 mg/dL — ABNORMAL HIGH (ref <117)

## 2012-06-18 ENCOUNTER — Other Ambulatory Visit: Payer: Self-pay | Admitting: Family Medicine

## 2012-06-19 ENCOUNTER — Other Ambulatory Visit: Payer: Self-pay

## 2012-06-19 MED ORDER — ALPRAZOLAM 0.25 MG PO TABS: ORAL_TABLET | ORAL | Status: DC

## 2012-07-14 ENCOUNTER — Other Ambulatory Visit: Payer: Self-pay | Admitting: Family Medicine

## 2012-08-08 ENCOUNTER — Telehealth: Payer: Self-pay | Admitting: Family Medicine

## 2012-08-08 MED ORDER — ALPRAZOLAM 0.25 MG PO TABS: ORAL_TABLET | ORAL | Status: DC

## 2012-08-08 NOTE — Telephone Encounter (Signed)
Called pharmacy and it does not require PA but is it ok to send in refills?

## 2012-08-08 NOTE — Telephone Encounter (Signed)
Medication refilled

## 2012-08-28 ENCOUNTER — Telehealth: Payer: Self-pay | Admitting: Family Medicine

## 2012-08-28 DIAGNOSIS — B370 Candidal stomatitis: Secondary | ICD-10-CM

## 2012-08-28 MED ORDER — FIRST-DUKES MOUTHWASH MT SUSP: OROMUCOSAL | Status: DC

## 2012-08-28 MED ORDER — FLUCONAZOLE 100 MG PO TABS: 100.0000 mg | ORAL_TABLET | Freq: Every day | ORAL | Status: DC

## 2012-08-28 NOTE — Addendum Note (Signed)
Addended by: Milinda Antis F on: 08/28/2012 03:05 PM   Modules accepted: Orders

## 2012-08-28 NOTE — Telephone Encounter (Signed)
Has thrush - finished the bottle of nystatin and tongue still red and raw. Can't taste food and pharmacist recommended Dukes magic mouthwash

## 2012-08-28 NOTE — Telephone Encounter (Signed)
States she would like the nystatin and diflucan if possible

## 2012-08-28 NOTE — Telephone Encounter (Addendum)
She continues to have problems with thrush, please advise her I will start diflucan again, she will be given the dukes magic mouth wash this time  I am making a referral for her to see GI, this needs to be evaluated and I can not continue to treat her without a complete evaluation of why she keeps getting thrush, this is very important!  Eat yogurt with live cultures to help with the thrush and keep up on her water intake

## 2012-08-29 ENCOUNTER — Telehealth: Payer: Self-pay | Admitting: Family Medicine

## 2012-08-29 NOTE — Telephone Encounter (Signed)
Noted, pt has declined both GI and cardiology but continues to suffer with same problems and will often end up in ER.

## 2012-08-29 NOTE — Telephone Encounter (Signed)
This is what she told Dr Jeanella Flattery office when they called to schedule her an appt.Pamela Vasquez

## 2012-08-30 ENCOUNTER — Other Ambulatory Visit: Payer: Self-pay | Admitting: Family Medicine

## 2012-08-30 NOTE — Telephone Encounter (Signed)
Pt aware.

## 2012-09-26 ENCOUNTER — Ambulatory Visit: Payer: Medicare Other | Admitting: Family Medicine

## 2012-11-10 ENCOUNTER — Telehealth: Payer: Self-pay | Admitting: Family Medicine

## 2012-11-13 NOTE — Telephone Encounter (Signed)
Patient is aware 

## 2012-11-16 ENCOUNTER — Other Ambulatory Visit: Payer: Self-pay | Admitting: Family Medicine

## 2013-01-02 ENCOUNTER — Other Ambulatory Visit: Payer: Self-pay | Admitting: Family Medicine

## 2013-01-02 ENCOUNTER — Encounter: Payer: Self-pay | Admitting: Family Medicine

## 2013-01-02 NOTE — Telephone Encounter (Signed)
Refilled medication for 30 days, sent letter to pt needing appt

## 2013-01-03 ENCOUNTER — Other Ambulatory Visit: Payer: Self-pay | Admitting: Family Medicine

## 2013-01-03 ENCOUNTER — Encounter: Payer: Self-pay | Admitting: Family Medicine

## 2013-01-06 ENCOUNTER — Other Ambulatory Visit: Payer: Self-pay | Admitting: Family Medicine

## 2013-01-15 ENCOUNTER — Other Ambulatory Visit: Payer: Self-pay | Admitting: Family Medicine

## 2013-01-15 NOTE — Telephone Encounter (Signed)
Forwarding to pcp

## 2013-01-15 NOTE — Telephone Encounter (Signed)
Okay to fill? 

## 2013-01-17 MED ORDER — ALPRAZOLAM 0.25 MG PO TABS: ORAL_TABLET | ORAL | Status: DC

## 2013-01-17 NOTE — Addendum Note (Signed)
Addended by: Elvina Mattes T on: 01/17/2013 09:58 AM   Modules accepted: Orders

## 2013-01-17 NOTE — Telephone Encounter (Signed)
Med called out 

## 2013-01-26 ENCOUNTER — Telehealth: Payer: Self-pay | Admitting: Family Medicine

## 2013-01-26 NOTE — Telephone Encounter (Signed)
Nystatin 100000 u/ml sups take 1 teaspoon QID #240

## 2013-01-26 NOTE — Telephone Encounter (Signed)
Med denied pt needs to be seen, pt aware

## 2013-01-29 ENCOUNTER — Other Ambulatory Visit: Payer: Self-pay | Admitting: Family Medicine

## 2013-02-08 ENCOUNTER — Other Ambulatory Visit: Payer: Self-pay | Admitting: Family Medicine

## 2013-03-06 ENCOUNTER — Emergency Department (HOSPITAL_COMMUNITY): Payer: Medicare Other

## 2013-03-06 ENCOUNTER — Encounter (HOSPITAL_COMMUNITY): Payer: Self-pay | Admitting: Emergency Medicine

## 2013-03-06 ENCOUNTER — Observation Stay (HOSPITAL_COMMUNITY)
Admission: EM | Admit: 2013-03-06 | Discharge: 2013-03-07 | Disposition: A | Discharge status: Home / Self Care | Payer: Medicare Other | Attending: Internal Medicine | Admitting: Internal Medicine

## 2013-03-06 DIAGNOSIS — I517 Cardiomegaly: Secondary | ICD-10-CM

## 2013-03-06 DIAGNOSIS — Z79899 Other long term (current) drug therapy: Secondary | ICD-10-CM | POA: Insufficient documentation

## 2013-03-06 DIAGNOSIS — I10 Essential (primary) hypertension: Secondary | ICD-10-CM | POA: Diagnosis present

## 2013-03-06 DIAGNOSIS — R5382 Chronic fatigue, unspecified: Secondary | ICD-10-CM | POA: Insufficient documentation

## 2013-03-06 DIAGNOSIS — Z91199 Patient's noncompliance with other medical treatment and regimen due to unspecified reason: Secondary | ICD-10-CM | POA: Insufficient documentation

## 2013-03-06 DIAGNOSIS — N760 Acute vaginitis: Secondary | ICD-10-CM

## 2013-03-06 DIAGNOSIS — K219 Gastro-esophageal reflux disease without esophagitis: Secondary | ICD-10-CM | POA: Diagnosis present

## 2013-03-06 DIAGNOSIS — G609 Hereditary and idiopathic neuropathy, unspecified: Secondary | ICD-10-CM

## 2013-03-06 DIAGNOSIS — Z9119 Patient's noncompliance with other medical treatment and regimen: Secondary | ICD-10-CM | POA: Insufficient documentation

## 2013-03-06 DIAGNOSIS — K5900 Constipation, unspecified: Secondary | ICD-10-CM

## 2013-03-06 DIAGNOSIS — L0232 Furuncle of buttock: Secondary | ICD-10-CM

## 2013-03-06 DIAGNOSIS — B370 Candidal stomatitis: Secondary | ICD-10-CM

## 2013-03-06 DIAGNOSIS — L0291 Cutaneous abscess, unspecified: Secondary | ICD-10-CM

## 2013-03-06 DIAGNOSIS — R0789 Other chest pain: Secondary | ICD-10-CM | POA: Insufficient documentation

## 2013-03-06 DIAGNOSIS — I498 Other specified cardiac arrhythmias: Principal | ICD-10-CM | POA: Insufficient documentation

## 2013-03-06 DIAGNOSIS — E785 Hyperlipidemia, unspecified: Secondary | ICD-10-CM

## 2013-03-06 DIAGNOSIS — IMO0001 Reserved for inherently not codable concepts without codable children: Secondary | ICD-10-CM | POA: Diagnosis present

## 2013-03-06 DIAGNOSIS — M1990 Unspecified osteoarthritis, unspecified site: Secondary | ICD-10-CM

## 2013-03-06 DIAGNOSIS — E663 Overweight: Secondary | ICD-10-CM

## 2013-03-06 DIAGNOSIS — R5381 Other malaise: Secondary | ICD-10-CM

## 2013-03-06 DIAGNOSIS — I00 Rheumatic fever without heart involvement: Secondary | ICD-10-CM | POA: Insufficient documentation

## 2013-03-06 DIAGNOSIS — G43909 Migraine, unspecified, not intractable, without status migrainosus: Secondary | ICD-10-CM

## 2013-03-06 DIAGNOSIS — G9332 Myalgic encephalomyelitis/chronic fatigue syndrome: Secondary | ICD-10-CM | POA: Insufficient documentation

## 2013-03-06 DIAGNOSIS — R05 Cough: Secondary | ICD-10-CM

## 2013-03-06 DIAGNOSIS — M545 Low back pain: Secondary | ICD-10-CM

## 2013-03-06 DIAGNOSIS — F329 Major depressive disorder, single episode, unspecified: Secondary | ICD-10-CM

## 2013-03-06 DIAGNOSIS — L02419 Cutaneous abscess of limb, unspecified: Secondary | ICD-10-CM | POA: Insufficient documentation

## 2013-03-06 DIAGNOSIS — IMO0002 Reserved for concepts with insufficient information to code with codable children: Secondary | ICD-10-CM | POA: Insufficient documentation

## 2013-03-06 DIAGNOSIS — R079 Chest pain, unspecified: Secondary | ICD-10-CM

## 2013-03-06 DIAGNOSIS — K589 Irritable bowel syndrome without diarrhea: Secondary | ICD-10-CM | POA: Insufficient documentation

## 2013-03-06 DIAGNOSIS — F3289 Other specified depressive episodes: Secondary | ICD-10-CM | POA: Insufficient documentation

## 2013-03-06 DIAGNOSIS — Z86010 Personal history of colonic polyps: Secondary | ICD-10-CM

## 2013-03-06 DIAGNOSIS — F411 Generalized anxiety disorder: Secondary | ICD-10-CM

## 2013-03-06 DIAGNOSIS — N318 Other neuromuscular dysfunction of bladder: Secondary | ICD-10-CM

## 2013-03-06 DIAGNOSIS — M129 Arthropathy, unspecified: Secondary | ICD-10-CM | POA: Insufficient documentation

## 2013-03-06 DIAGNOSIS — R002 Palpitations: Secondary | ICD-10-CM | POA: Diagnosis present

## 2013-03-06 LAB — URINALYSIS, ROUTINE W REFLEX MICROSCOPIC
Hgb urine dipstick: NEGATIVE
Leukocytes, UA: NEGATIVE
Nitrite: NEGATIVE
Protein, ur: NEGATIVE mg/dL
Specific Gravity, Urine: <1.005 — ABNORMAL LOW (ref 1.005–1.030)
Urobilinogen, UA: 0.2 mg/dL (ref 0.0–1.0)

## 2013-03-06 LAB — CBC WITH DIFFERENTIAL/PLATELET
Basophils Absolute: 0 10*3/uL (ref 0.0–0.1)
Eosinophils Relative: 2 % (ref 0–5)
Lymphocytes Relative: 28 % (ref 12–46)
Lymphs Abs: 2.1 10*3/uL (ref 0.7–4.0)
MCV: 88.3 fL (ref 78.0–100.0)
Neutro Abs: 4.5 10*3/uL (ref 1.7–7.7)
Neutrophils Relative %: 61 % (ref 43–77)
Platelets: 271 10*3/uL (ref 150–400)
RBC: 4.97 MIL/uL (ref 3.87–5.11)
RDW: 13.2 % (ref 11.5–15.5)
WBC: 7.4 10*3/uL (ref 4.0–10.5)

## 2013-03-06 LAB — BASIC METABOLIC PANEL
CO2: 27 mEq/L (ref 19–32)
Calcium: 10.5 mg/dL (ref 8.4–10.5)
Glucose, Bld: 120 mg/dL — ABNORMAL HIGH (ref 70–99)
Potassium: 3.7 mEq/L (ref 3.5–5.1)
Sodium: 141 mEq/L (ref 135–145)

## 2013-03-06 LAB — POCT I-STAT TROPONIN I: Troponin i, poc: 0 ng/mL (ref 0.00–0.08)

## 2013-03-06 LAB — TROPONIN I
Troponin I: <0.3 ng/mL
Troponin I: <0.3 ng/mL

## 2013-03-06 LAB — D-DIMER, QUANTITATIVE: D-Dimer, Quant: 0.48 ug/mL-FEU (ref 0.00–0.48)

## 2013-03-06 LAB — MRSA PCR SCREENING: MRSA by PCR: NEGATIVE

## 2013-03-06 MED ORDER — PANTOPRAZOLE SODIUM 40 MG PO TBEC
40.0000 mg | DELAYED_RELEASE_TABLET | Freq: Every day | ORAL | Status: DC
  Administered 2013-03-06 – 2013-03-07 (×2): 40 mg via ORAL
  Filled 2013-03-06 (×2): qty 1

## 2013-03-06 MED ORDER — NYSTATIN 100000 UNIT/ML MT SUSP
5.0000 mL | Freq: Four times a day (QID) | OROMUCOSAL | Status: DC
  Administered 2013-03-06 – 2013-03-07 (×2): 500000 [IU] via ORAL
  Filled 2013-03-06 (×2): qty 5

## 2013-03-06 MED ORDER — SODIUM CHLORIDE 0.9 % IV BOLUS (SEPSIS)
1000.0000 mL | Freq: Once | INTRAVENOUS | Status: AC
  Administered 2013-03-06: 1000 mL via INTRAVENOUS

## 2013-03-06 MED ORDER — ENOXAPARIN SODIUM 40 MG/0.4ML ~~LOC~~ SOLN
40.0000 mg | SUBCUTANEOUS | Status: DC
  Filled 2013-03-06: qty 0.4

## 2013-03-06 MED ORDER — SULFAMETHOXAZOLE-TMP DS 800-160 MG PO TABS
2.0000 | ORAL_TABLET | Freq: Two times a day (BID) | ORAL | Status: DC
  Filled 2013-03-06 (×2): qty 2

## 2013-03-06 MED ORDER — ALPRAZOLAM 0.25 MG PO TABS: 0.1250 mg | ORAL_TABLET | Freq: Three times a day (TID) | ORAL | Status: DC | PRN

## 2013-03-06 MED ORDER — POLYVINYL ALCOHOL 1.4 % OP SOLN
1.0000 [drp] | OPHTHALMIC | Status: DC | PRN
  Filled 2013-03-06: qty 15

## 2013-03-06 MED ORDER — LISINOPRIL 10 MG PO TABS
10.0000 mg | ORAL_TABLET | Freq: Two times a day (BID) | ORAL | Status: DC
  Administered 2013-03-06 – 2013-03-07 (×2): 10 mg via ORAL
  Filled 2013-03-06 (×3): qty 1

## 2013-03-06 MED ORDER — ONDANSETRON HCL 4 MG/2ML IJ SOLN: 4.0000 mg | Freq: Four times a day (QID) | INTRAMUSCULAR | Status: DC | PRN

## 2013-03-06 MED ORDER — ACETAMINOPHEN 325 MG PO TABS
650.0000 mg | ORAL_TABLET | ORAL | Status: DC | PRN
  Administered 2013-03-06: 325 mg via ORAL
  Filled 2013-03-06: qty 2

## 2013-03-06 NOTE — Progress Notes (Signed)
*  PRELIMINARY RESULTS* Echocardiogram 2D Echocardiogram has been performed.  Pamela Vasquez 03/06/2013, 3:49 PM

## 2013-03-06 NOTE — ED Notes (Signed)
Patient presents to ER via [facility] with c/o high blood pressure and an abscess to back of right thigh.

## 2013-03-06 NOTE — ED Provider Notes (Signed)
This chart was scribed for Pamela Maw Otis Burress, DO by Dorothey Baseman, ED Scribe. This patient was seen in room APA18/APA18 and the patient's care was started at 7:11 AM.   TIME SEEN: 7:11 AM  CHIEF COMPLAINT: abscess and hypertension  HPI:  Pamela Vasquez is a 62 y.o. female with history of fibromyalgia, hypertension, chronic fatigue, irritable bowel syndrome, depression, hyperlipidemia brought in by ambulance who presents to the Emergency Department complaining of hypertension, highest at 202/100 that the patient reports was per EMS, onset around 3 hours ago. She also reports palpitations. She reports some associated chest tightness and lightheadedness that she states lasted several minutes and is currently improving. She reports her chest tightness radiated into her left arm and lasted for several minutes. No associated shortness of breath or diaphoresis. No nausea or vomiting. Patient reports that she took one dose of Lisinopril and half of a Tylenol around 2 hours ago. She reports her chest pain is gone but she still feels lightheaded and feels palpitations.  She states that she had a stress-test performed around 10 years ago.  Patient reports a history of cardiac catheterization over 10 years ago that was normal. No history of stents. She denies any prior history of PE or DVT. No lower extremity swelling or pain.  Patient is currently on bed rest for chronic fatigue syndrome and fibromyalgia.    She reports a history of bowel frequency, but denies any recent changes. She denies any recent vomiting, diarrhea, hematochezia, or melena. She denies history of DVT or pulmonary embolism.   Patient also reports an abscess to the back of her right leg that she reports has been there for 4 days without drainage. She states that she applied warm compresses at home without relief. Patient reports a history of abscesses.   PCP- Dr. Lodema Hong  No cardiologist  ROS: See HPI Constitutional: no fever  Eyes: no  drainage  ENT: no runny nose   Cardiovascular: mild chest tightness Resp: no SOB  GI: no vomiting GU: no dysuria Integumentary: no rash  Allergy: no hives  Musculoskeletal: no leg swelling  Neurological: no slurred speech ROS otherwise negative  PAST MEDICAL HISTORY/PAST SURGICAL HISTORY:  Past Medical History  Diagnosis Date  . Arthritis   . Hypertension   . Fibromyalgia   . Chronic fatigue   . Acid reflux   . Degenerative disc disease   . Irritable bowel syndrome (IBS)   . Interstitial cystitis   . Rheumatic fever   . Depression   . Blepharitis     Recurrent  . Vertigo     MEDICATIONS:  Prior to Admission medications   Medication Sig Start Date End Date Taking? Authorizing Provider  acetaminophen (TYLENOL) 500 MG tablet Take 250-500 mg by mouth every 6 (six) hours as needed. For pain or headaches    Historical Provider, MD  ALPRAZolam (XANAX) 0.25 MG tablet TAKE ONE TABLET BY MOUTH 3 TIMES DAILY. 01/17/13   Salley Scarlet, MD  Artificial Tear Ointment (ARTIFICIAL TEARS) ointment Place 1 drop into both eyes as needed. For dry eyes    Historical Provider, MD  Diphenhyd-Hydrocort-Nystatin (FIRST-DUKES MOUTHWASH) SUSP Swish four times a day for thrush 08/28/12   Salley Scarlet, MD  fluconazole (DIFLUCAN) 100 MG tablet Take 1 tablet (100 mg total) by mouth daily. 08/28/12   Salley Scarlet, MD  lisinopril (PRINIVIL,ZESTRIL) 10 MG tablet TAKE (1) TABLET BY MOUTH TWICE DAILY. 01/03/13   Salley Scarlet, MD  metoprolol succinate (TOPROL  XL) 25 MG 24 hr tablet Take 1/2 tablet daily 02/10/12 02/09/17  Salley Scarlet, MD  NEXIUM 40 MG capsule TAKE 1 CAPSULE BY MOUTH TWICE DAILY. 01/03/13   Salley Scarlet, MD  nystatin (MYCOSTATIN) 100000 UNIT/ML suspension TAKE 1 TEASPOONFUL BY MOUTH FOUR TIMES A DAY. 11/16/12   Salley Scarlet, MD    ALLERGIES:  Allergies  Allergen Reactions  . Aspirin Other (See Comments)    Eats up her stomach  . Codeine Other (See Comments)    vomiting   . Demerol Other (See Comments)    vomitting  . Nsaids     SOCIAL HISTORY:  History  Substance Use Topics  . Smoking status: Never Smoker   . Smokeless tobacco: Not on file  . Alcohol Use: No    FAMILY HISTORY: Family History  Problem Relation Age of Onset  . Osteoarthritis Mother   . Heart disease Mother   . Hyperlipidemia Mother   . Cancer Father     colon  . Hyperlipidemia Father   . Heart disease Father   . Rheum arthritis Brother   . Osteoarthritis Brother   . Depression Brother   . Heart disease Brother   . Hyperlipidemia Brother   . Cancer Other   . Depression Sister   . Heart disease Sister   . Hyperlipidemia Sister     EXAM:  Triage Vitals: BP 161/94  Pulse 111  Temp(Src) 98.5 F (36.9 C) (Oral)  Resp 20  Ht 5\' 4"  (1.626 m)  Wt 150 lb (68.04 kg)  BMI 25.73 kg/m2  SpO2 97%  CONSTITUTIONAL: Alert and oriented and responds appropriately to questions. Well-appearing; well-nourished, anxious HEAD: Normocephalic EYES: Conjunctivae clear, PERRL ENT: normal nose; no rhinorrhea; moist mucous membranes; pharynx without lesions noted NECK: Supple, no meningismus, no LAD  CARD: RRR; S1 and S2 appreciated; no murmurs, no clicks, no rubs, no gallops RESP: Normal chest excursion without splinting or tachypnea; breath sounds clear and equal bilaterally; no wheezes, no rhonchi, no rales,  ABD/GI: Normal bowel sounds; non-distended; soft, non-tender, no rebound, no guarding BACK:  The back appears normal and is non-tender to palpation, there is no CVA tenderness EXT: Normal ROM in all joints; non-tender to palpation; no edema; normal capillary refill; no cyanosis    SKIN: Normal color for age and race; warm, 2cm area of erythema and warmth to posterior right thigh, but without fluctuance or active drainage NEURO: Moves all extremities equally, no facial droop, no slurred speech PSYCH: The patient's mood and manner are appropriate. Grooming and personal hygiene are  appropriate.  MEDICAL DECISION MAKING: 7:23AM- Discussed that she will likely be admitted to the hospital for chest pain rule out given risk factors of age, hypertension, hyperlipidemia and family history. Pt refuses ASA due to GI upset.  She is currently CP free.  Will order cardiac labs for work-up. We'll also order D. dimer given patient is tachycardic and is mostly bedbound. Discussed that an incision and drainage of the abscess on her right posterior thigh will not be necessary today in the ED. Discussed treatment plan with patient at bedside and patient verbalized agreement.  8:23AM- Provider independently reviewed imaging and lab results. Discussed treatment plan with patient at bedside and patient verbalized agreement.     Date: 03/06/2013 6:59 AM  Rate: 115  Rhythm: Sinus tachycardia  QRS Axis: normal  Intervals: normal  ST/T Wave abnormalities: normal  Conduction Disutrbances: none  Narrative Interpretation: unremarkable, no ischemic changes, unchanged compared to prior  EKG   ED PROGRESS: Patient's blood pressure and heart rate have improved. She still feeling lightheaded but is still not having chest pain. Her troponin is negative, chest x-ray clear. D-dimer normal. Will discuss with hospitalist for admission for chest pain rule out. Patient also reports that she is unable to take many antibiotics he refuses antibiotic at this time for the abscess on her right thigh. Given this abscesses so small, I feel that it can be monitored and she can use warm compresses.   8:58 AM  Spoke with Dr. Irene Limbo for admission.   I personally performed the services described in this documentation, which was scribed in my presence. The recorded information has been reviewed and is accurate.     Pamela Maw Jazz Rogala, DO 03/06/13 [phone]

## 2013-03-06 NOTE — Progress Notes (Signed)
Patient transferred to room 311. Report given to Dagoberto Ligas RN. Vital signs stable.

## 2013-03-06 NOTE — H&P (Signed)
History and Physical  Pamela Vasquez:[id number] DOB: 02/07/1951 DOA: 03/06/2013  Referring physician: Dr. Elesa Massed in ED PCP: Milinda Antis, MD   Chief Complaint: heart racing  HPI:  62 year old woman presents to the emergency department with history of palpitations sudden onset this morning associated with chest discomfort and pain in her left arm. Initial evaluation in the emergency department was unremarkable and patient was referred for cardiac rule out.  Patient reports she developed sudden palpitations approximately 4 or 5 AM this morning, a sensation of her heart racing and she felt like her blood pressure was high. According to the chart blood pressure may have been high at the house. She had associated chest discomfort, perhaps 8/10 with some radiation to her left arm. No shortness of breath. No nausea or vomiting. No pain in left neck or jaw. Resolved spontaneously. No specific aggravating or alleviating factors. She has had many episodes of chest discomfort and palpitations in the past that self resolved. She reports her somewhat distant history of cardiac catheterization, results unavailable. On chart review she has been noncompliant with outpatient referral to gastroenterology and cardiology. She has a history of fibromyalgia and chronic fatigue syndrome and spends most of her time in bed. She is able to ambulate but uses a motorized scooter.  In the emergency department noted to be afebrile with stable vital signs, mild tachycardia resolved. She was not given aspirin because she refuses. Screening laboratory studies were unremarkable. Chest x-ray negative. EKG nonacute. D-dimer within normal limits.  Review of Systems:  Negative for fever, visual changes, sore throat, rash, new muscle aches, SOB, dysuria, bleeding, n/v/abdominal pain.  Past Medical History  Diagnosis Date  . Arthritis   . Hypertension   . Fibromyalgia   . Chronic fatigue   . Acid reflux   . Degenerative disc  disease   . Irritable bowel syndrome (IBS)   . Interstitial cystitis   . Rheumatic fever   . Depression   . Blepharitis     Recurrent  . Vertigo     Past Surgical History  Procedure Laterality Date  . Knee arthroplasty      twice bilateral  . Carpal tunnel release      bilateral  . Wrist arthroplasty    . Cholecystectomy    . Oophorectomy    . Abdominal hysterectomy      Social History:  reports that she has never smoked. She does not have any smokeless tobacco history on file. She reports that she does not drink alcohol or use illicit drugs.  Allergies  Allergen Reactions  . Aspirin Other (See Comments)    Eats up her stomach  . Codeine Other (See Comments)    vomiting  . Demerol Other (See Comments)    vomitting  . Nsaids     Family History  Problem Relation Age of Onset  . Osteoarthritis Mother   . Heart disease Mother   . Hyperlipidemia Mother   . Cancer Father     colon  . Hyperlipidemia Father   . Heart disease Father   . Rheum arthritis Brother   . Osteoarthritis Brother   . Depression Brother   . Heart disease Brother   . Hyperlipidemia Brother   . Cancer Other   . Depression Sister   . Heart disease Sister   . Hyperlipidemia Sister      Prior to Admission medications   Medication Sig Start Date End Date Taking? Authorizing Provider  acetaminophen (TYLENOL) 325 MG tablet Take  162.5 mg by mouth every 6 (six) hours as needed for pain.   Yes Historical Provider, MD  ALPRAZolam (XANAX) 0.25 MG tablet Take 0.125 mg by mouth 3 (three) times daily as needed for anxiety.    Yes Historical Provider, MD  Artificial Tear Ointment (ARTIFICIAL TEARS) ointment Place 1 drop into both eyes as needed. For dry eyes   Yes Historical Provider, MD  Diphenhyd-Hydrocort-Nystatin (FIRST-DUKES MOUTHWASH) SUSP Swish four times a day for thrush 08/28/12  Yes Salley Scarlet, MD  esomeprazole (NEXIUM) 40 MG capsule Take 40 mg by mouth daily before breakfast.   Yes Historical  Provider, MD  lisinopril (PRINIVIL,ZESTRIL) 10 MG tablet TAKE (1) TABLET BY MOUTH TWICE DAILY. 01/03/13  Yes Salley Scarlet, MD   Physical Exam: Filed Vitals:   03/06/13 1610 03/06/13 0820 03/06/13 0900  BP: 161/94 155/83 135/75  Pulse: 111 93 94  Temp: 98.5 F (36.9 C)    TempSrc: Oral    Resp: 20 18 19   Height: 5\' 4"  (1.626 m)    Weight: 68.04 kg (150 lb)    SpO2: 97%      General: Examined in the emergency department. Appears calm and comfortable Eyes: PERRL, normal lids, irises  ENT: grossly normal hearing, lips & tongue Neck: no LAD, masses or thyromegaly Cardiovascular: RRR, no m/r/g. No LE edema. Respiratory: CTA bilaterally, no w/r/r. Normal respiratory effort. Abdomen: soft, ntnd Skin: No rash. Small area of induration and erythema posterior right thigh, minimal tenderness. No fluctuance or exudate. Approximately 3 cm in circumference. Musculoskeletal: grossly normal tone BUE/BLE. Moves extremities well. Strength bilateral lower extremities appears grossly normal, able to lift both legs off the bed without difficulty. Psychiatric: Appears depressed. Flat affect. Speech fluent and clear. Neurologic: grossly non-focal.  Wt Readings from Last 3 Encounters:  03/06/13 68.04 kg (150 lb)  05/04/12 68.04 kg (150 lb)  01/11/12 66.225 kg (146 lb)    Labs on Admission:  Basic Metabolic Panel:  Recent Labs Lab 03/06/13 0729  NA 141  K 3.7  CL 103  CO2 27  GLUCOSE 120*  BUN 14  CREATININE 0.97  CALCIUM 10.5    CBC:  Recent Labs Lab 03/06/13 0729  WBC 7.4  NEUTROABS 4.5  HGB 15.4*  HCT 43.9  MCV 88.3  PLT 271      Recent Labs  03/06/13 0743  TROPIPOC 0.00    Radiological Exams on Admission: Dg Chest 2 View  03/06/2013   CLINICAL DATA:  Chest pain and shortness of Breath  EXAM: CHEST  2 VIEW  COMPARISON:  November 20, 2011  FINDINGS: The lungs are clear. Heart size and pulmonary vascularity are normal. No adenopathy. No bone lesions.  IMPRESSION: No  edema or consolidation.   Electronically Signed   By: Bretta Bang   On: 03/06/2013 08:09    EKG: Independently reviewed. Sinus tachycardia. No acute changes.   Principal Problem:   Chest pain Active Problems:   HYPERTENSION   GERD   FIBROMYALGIA   Heart palpitations   Assessment/Plan 1. Chest pain: Some typical features but doubt ACS or angina at this point. Troponin negative, EKG nonacute. Remote stress testing/cardiac catheterization. Plan observation, serial troponin. If negative anticipate discharge 9/24 with outpatient cardiology followup. D-dimer at upper limits of normal, however no shortness of breath and no features to suggest VTE. No further evaluation suggested at this point. Patient refuses aspirin secondary to GI upset. 2. Palpitations: Sinus tachycardia on EKG. Check TSH. Monitor on telemetry. Echocardiogram. 3. Right  thigh cellulitis without abscess: Oral antibiotics. Warm compresses. Monitor for development of abscess. 4. History of fibromyalgia, chronic fatigue: Patient spends most of her time in bed. He is able to ambulate without assistance but uses a scooter usually. 5. History of IBS 6. Noncompliance: According to the record she has been noncompliant with outpatient referrals to cardiology and GI.  Code Status: full code  DVT prophylaxis: Lovenox Family Communication: none present Disposition Plan/Anticipated LOS: obs, 24 hours  Time spent: 60 minutes  Brendia Sacks, MD  [facility] Pager [phone] 03/06/2013, 9:51 AM

## 2013-03-07 DIAGNOSIS — M1990 Unspecified osteoarthritis, unspecified site: Secondary | ICD-10-CM

## 2013-03-07 MED ORDER — METOPROLOL TARTRATE 25 MG PO TABS: 25.0000 mg | ORAL_TABLET | Freq: Two times a day (BID) | ORAL | Status: DC

## 2013-03-07 MED ORDER — METOPROLOL SUCCINATE ER 50 MG PO TB24: 50.0000 mg | ORAL_TABLET | Freq: Every day | ORAL | Status: DC

## 2013-03-07 MED ORDER — METOPROLOL TARTRATE 25 MG PO TABS
25.0000 mg | ORAL_TABLET | Freq: Two times a day (BID) | ORAL | Status: DC
  Filled 2013-03-07: qty 1

## 2013-03-07 MED ORDER — CEPHALEXIN 250 MG PO CAPS: 250.0000 mg | ORAL_CAPSULE | Freq: Two times a day (BID) | ORAL | Status: DC

## 2013-03-07 MED ORDER — NYSTATIN 100000 UNIT/ML MT SUSP: 5.0000 mL | Freq: Four times a day (QID) | OROMUCOSAL | Status: DC

## 2013-03-07 NOTE — Progress Notes (Signed)
Pamela Smothers, NP paged and made aware of patient's refusal of newly ordered Metoprolol.  Pt states "lisinopril and metoprolol were just too much together."  Patient made aware that both BP and HR are elevated this morning.  Still refuses.  Will continue to monitor.

## 2013-03-07 NOTE — Evaluation (Signed)
Physical Therapy Evaluation Patient Details Name: Pamela Vasquez MRN: [id number] DOB: [birth year] Today's Date: 03/07/2013 Time: 4098-1191 PT Time Calculation (min): 24 min  PT Assessment / Plan / Recommendation History of Present Illness  Pt is admitted with chest pain.  She states that she is home bound due to many years of multiple surgeries, fibromyalgia and chronic fatigue syndrome.  She can walk but states that it is easier to get about the house in a scooter.  She has had attempts at PT during the years, both outpaitent and homebound.  She states that she has all of the needed DME at home.  Clinical Impression   Pt is found to be at functional baseline.  Her strength and balance are WNL.  She displayed no antalgia with any movement of her body and was independent with gait and transfers.  It would appear that she has molded her life to meet her needs.  No further PT is indicated.    PT Assessment  Patent does not need any further PT services    Follow Up Recommendations  No PT follow up    Does the patient have the potential to tolerate intense rehabilitation      Barriers to Discharge        Equipment Recommendations  None recommended by PT    Recommendations for Other Services     Frequency      Precautions / Restrictions Precautions Precautions: None Restrictions Weight Bearing Restrictions: No   Pertinent Vitals/Pain       Mobility  Bed Mobility Bed Mobility: Supine to Sit;Sit to Supine Supine to Sit: 7: Independent Sit to Supine: 7: Independent Transfers Transfers: Sit to Stand;Stand to Sit Sit to Stand: 7: Independent Stand to Sit: 7: Independent Ambulation/Gait Ambulation/Gait Assistance: 7: Independent Ambulation Distance (Feet): 80 Feet Assistive device: None Gait Pattern: Within Functional Limits Gait velocity: WNL Stairs: No Wheelchair Mobility Wheelchair Mobility: No    Exercises     PT Diagnosis:    PT Problem List:   PT Treatment  Interventions:       PT Goals(Current goals can be found in the care plan section) Acute Rehab PT Goals PT Goal Formulation: No goals set, d/c therapy  Visit Information  Last PT Received On: 03/07/13 History of Present Illness: Pt is admitted with chest pain.  She states that she is home bound due to many years of multiple surgeries, fibromyalgia and chronic fatigue syndrome.  She can walk but states that it is easier to get about the house in a scooter.  She has had attempts at PT during the years, both outpaitent and homebound.  She states that she has all of the needed DME at home.       Prior Functioning  Home Living Family/patient expects to be discharged to:: Private residence Living Arrangements: Alone Available Help at Discharge: Friend(s);Available PRN/intermittently Type of Home: House Home Access: Stairs to enter [employer] of Steps: 3 Entrance Stairs-Rails: Right Home Layout: One level Home Equipment: Walker - 2 wheels;Shower seat - built in;Electric scooter;Transport chair Prior Function Level of Independence: Independent with assistive device(s) Communication Communication: No difficulties    Cognition  Cognition Arousal/Alertness: Awake/alert Behavior During Therapy: WFL for tasks assessed/performed Overall Cognitive Status: Within Functional Limits for tasks assessed    Extremity/Trunk Assessment Lower Extremity Assessment Lower Extremity Assessment: Overall WFL for tasks assessed   Balance Balance Balance Assessed:  (WNL by functional observation)  End of Session PT - End of Session Equipment Utilized  During Treatment: Gait belt Activity Tolerance: Patient tolerated treatment well Patient left: in bed  GP     Myrlene Broker L 03/07/2013, 10:47 AM

## 2013-03-07 NOTE — Clinical Social Work Note (Signed)
CSW met with pt along with CM. Pt reports she lives alone. She has two children but said that they are "too busy" to help. Pt concerned about PCP as Dr. Deirdre Peer office moved to [place]. She states this is too far for her to travel, yet does not really want to change physicians either. CM discussed this with her. She is also concerned about transportation as she only drives about a mile from her home. CSW offered [facility] resource with understanding that pt must provide several days notice and there will be a charge. Pt reports she would do better with having a one-on-one person with her to appointments. She is aware that this would have to be arranged on her own. Pt does not want home health PT as "it makes things worse." Pt said she wants to try to maintain her independence as much as possible. CSW will sign off. [employer] guide left in pt's room. D/C later today most likely.  Derenda Fennel, [place] [phone]

## 2013-03-07 NOTE — Progress Notes (Signed)
Pt discharged home today per Dr. Kerry Hough. Pt's IV site D/C'd and WNL. Pt's VS stable at this time. Pt provided with home medication list, discharge instructions, and prescriptions. Verbalized understanding. Pt left floor via WC in stable condition accompanied by NT.

## 2013-03-07 NOTE — Care Management Note (Addendum)
    Page 1 of 2   03/07/2013     11:43:31 AM   CARE MANAGEMENT NOTE 03/07/2013  Patient:  Pamela Vasquez, Pamela Vasquez   Account Number:  [id number]  Date Initiated:  03/07/2013  Documentation initiated by:  Sharrie Rothman  Subjective/Objective Assessment:   Pt admitted from home with CP. Pt lives alone and has 2 children who pt states "are too busy for mom". Pt also states that her mother and father are unable to provide much assistance due to age and health problems. Pt is able to do most ADL     Action/Plan:   independently. Pt is able to drive short distances. Pt has transport wheelchair. Pt has agreed to [facility] RN with [facility]. Alroy Bailiff of [facility] is aware and will c ollect pts information from the chart. HH services to start within 48 hours of discha   Anticipated DC Date:  03/07/2013   Anticipated DC Plan:  HOME W HOME HEALTH SERVICES      DC Planning Services  CM consult      [facility] Choice  HOME HEALTH   Choice offered to / List presented to:  C-1 Patient           Status of service:  Completed, signed off Medicare Important Message given?  NA - LOS <3 / Initial given by admissions (If response is "NO", the following Medicare IM given date fields will be blank) Date Medicare IM given:   Date Additional Medicare IM given:    Discharge Disposition:  HOME W HOME HEALTH SERVICES  Per UR Regulation:    If discussed at Long Length of Stay Meetings, dates discussed:    Comments:  03/07/13 1135 Arlyss Queen, RN BSN CM Pt has decided that she wants to have follow up care arranged with [facility]. Pt will all Dr. Rhona Raider office and have her records transfered. Pt refuses to have PCP appt made with Dr. Jeanice Lim so [facility] cannot be arranged. Pt is aware. Once medical records have been transfered to [facility] they will schedule pt an appt. MD made aware of change.  03/07/13 1115 Arlyss Queen, RN BSN CM Pt has PCP but is too far away from pts home and  would like somebody in [place]. Will arrange PCP with [facility]. No DME needs at this time. Pt and pts nurse aware of discharge arrangements.

## 2013-03-07 NOTE — Progress Notes (Signed)
UR chart review completed.  

## 2013-03-07 NOTE — Discharge Summary (Signed)
Physician Discharge Summary  Pamela Vasquez:[id number] DOB: [birth year] DOA: 03/06/2013  PCP: Milinda Antis, MD  Admit date: 03/06/2013 Discharge date: 03/07/2013  Time spent: 40 minutes  Recommendations for Outpatient Follow-up:  1. Follow up with PCP at [facility] in [place] in 1 week for monitoring of BP 2. [facility] will contact patient for follow up. Discharge Diagnoses:  Principal Problem:   Chest pain Active Problems:   HYPERTENSION   GERD   FIBROMYALGIA   Heart palpitations   Discharge Condition: stable  Diet recommendation: heart healthy  Filed Weights   03/06/13 0648  Weight: 68.04 kg (150 lb)    History of present illness:  61 year old woman presented to the emergency department on 03/06/13 with history of palpitations sudden onset that morning associated with chest discomfort and pain in her left arm. Initial evaluation in the emergency department was unremarkable and patient was referred for cardiac rule out.  Patient reported she developed sudden palpitations approximately 4 or 5 AM in the morning, a sensation of her heart racing and she felt like her blood pressure was high. According to the chart blood pressure may have been high at the house. She had associated chest discomfort, perhaps 8/10 with some radiation to her left arm. No shortness of breath. No nausea or vomiting. No pain in left neck or jaw. Resolved spontaneously. No specific aggravating or alleviating factors. She has had many episodes of chest discomfort and palpitations in the past that self resolved. She reported her somewhat distant history of cardiac catheterization, results unavailable. On chart review she has been noncompliant with outpatient referral to gastroenterology and cardiology. She has a history of fibromyalgia and chronic fatigue syndrome and spends most of her time in bed. She is able to ambulate but uses a motorized scooter.  In the emergency department noted to be  afebrile with stable vital signs, mild tachycardia resolved. She was not given aspirin because she refused. Screening laboratory studies were unremarkable. Chest x-ray negative. EKG nonacute. D-dimer within normal limits.      Hospital Course:  1. Chest pain: Some typical features but doubt ACS or angina.. Troponin negative x3, EKG nonacute. Repeat EKG on day of discharge nonacute.  Remote stress testing/cardiac catheterization.  [facility] will contact patient for  outpatient cardiology followup. D-dimer at upper limits of normal, however no shortness of breath and no features to suggest VTE. No further evaluation suggested. Patient refuses aspirin secondary to GI upset. 2. Palpitations: Sinus tachycardia on EKG. TSH 0.637. No events on tele.  Echocardiogram yields EF 60% with grade 1 diastolic dysfunction. Started on beta blocker. 3. Right thigh cellulitis without abscess, very mild: Oral antibiotics. Warm compresses.  4. History of fibromyalgia, chronic fatigue: Patient spends most of her time in bed. She is able to ambulate without assistance but uses a scooter usually. Case management and SW provided patient with resources for OP assistance to get to medical appointment. I called son to let him know of plan for OP medical follow up 5. History of IBS 6. Noncompliance: According to the record she has been noncompliant with outpatient referrals to cardiology and GI. See #4     Procedures:  none  Consultations:  none  Discharge Exam: Filed Vitals:   03/07/13 0608  BP: 158/92  Pulse: 106  Temp: 98.4 F (36.9 C)  Resp: 20    General: obese NAD Cardiovascular: RRR No MGR No LE edema PPP Respiratory: normal effort BS clear bilaterally no wheeze no rhonchi Skin:  small area erythema posterior right thigh. Some firmness, no heat, mild tenderness. No fluctuance  Discharge Instructions      Discharge Orders   Future Orders Complete By Expires   Diet - low sodium heart  healthy  As directed    Discharge instructions  As directed    Comments:     Take medication as directed.  Follow up with [facility] in 1 week for evaluation of BP [facility] will contact you for follow up appointment   Increase activity slowly  As directed        Medication List    STOP taking these medications       lisinopril 10 MG tablet  Commonly known as:  PRINIVIL,ZESTRIL      TAKE these medications       acetaminophen 325 MG tablet  Commonly known as:  TYLENOL  Take 162.5 mg by mouth every 6 (six) hours as needed for pain.     ALPRAZolam 0.25 MG tablet  Commonly known as:  XANAX  Take 0.125 mg by mouth 3 (three) times daily as needed for anxiety.     artificial tears ointment  Place 1 drop into both eyes as needed. For dry eyes     cephALEXin 250 MG capsule  Commonly known as:  KEFLEX  Take 1 capsule (250 mg total) by mouth 2 (two) times daily.     esomeprazole 40 MG capsule  Commonly known as:  NEXIUM  Take 40 mg by mouth daily before breakfast.     FIRST-DUKES MOUTHWASH Susp  Swish four times a day for thrush     metoprolol tartrate 25 MG tablet  Commonly known as:  LOPRESSOR  Take 1 tablet (25 mg total) by mouth 2 (two) times daily.     nystatin 100000 UNIT/ML suspension  Commonly known as:  MYCOSTATIN  Take 5 mLs (500,000 Units total) by mouth 4 (four) times daily.       Allergies  Allergen Reactions  . Aspirin Other (See Comments)    Eats up her stomach  . Codeine Other (See Comments)    vomiting  . Demerol Other (See Comments)    vomitting  . Nsaids    Follow-up Information   Follow up with [facility]. Schedule an appointment as soon as possible for a visit in 1 week. (evaluation of symptoms. will need close BP monitoring and follow up)    Contact information:   [address] [place] [facility] [address] [phone]      Follow up with [facility] [place]. (Office will contact patient  for follow up appointment)    Specialty:  Cardiology   Contact information:   [address] [place] [address] [phone]       The results of significant diagnostics from this hospitalization (including imaging, microbiology, ancillary and laboratory) are listed below for reference.    Significant Diagnostic Studies: Dg Chest 2 View  03/06/2013   CLINICAL DATA:  Chest pain and shortness of Breath  EXAM: CHEST  2 VIEW  COMPARISON:  November 20, 2011  FINDINGS: The lungs are clear. Heart size and pulmonary vascularity are normal. No adenopathy. No bone lesions.  IMPRESSION: No edema or consolidation.   Electronically Signed   By: Bretta Bang   On: 03/06/2013 08:09    Microbiology: Recent Results (from the past 240 hour(s))  MRSA PCR SCREENING     Status: None   Collection Time    03/06/13 10:10 AM  Result Value Range Status   MRSA by PCR NEGATIVE  NEGATIVE Final   Comment:            The GeneXpert MRSA Assay (FDA     approved for NASAL specimens     only), is one component of a     comprehensive MRSA colonization     surveillance program. It is not     intended to diagnose MRSA     infection nor to guide or     monitor treatment for     MRSA infections.     Labs: Basic Metabolic Panel:  Recent Labs Lab 03/06/13 0729  NA 141  K 3.7  CL 103  CO2 27  GLUCOSE 120*  BUN 14  CREATININE 0.97  CALCIUM 10.5   Liver Function Tests: No results found for this basename: AST, ALT, ALKPHOS, BILITOT, PROT, ALBUMIN,  in the last 168 hours No results found for this basename: LIPASE, AMYLASE,  in the last 168 hours No results found for this basename: AMMONIA,  in the last 168 hours CBC:  Recent Labs Lab 03/06/13 0729  WBC 7.4  NEUTROABS 4.5  HGB 15.4*  HCT 43.9  MCV 88.3  PLT 271   Cardiac Enzymes:  Recent Labs Lab 03/06/13 1300 03/06/13 1915 03/07/13 0107  TROPONINI <0.30 <0.30 <0.30   BNP: BNP (last 3 results) No results found for this basename:  PROBNP,  in the last 8760 hours CBG: No results found for this basename: GLUCAP,  in the last 168 hours     Signed:  MEMON,JEHANZEB  [facility] 03/07/2013, 1:11 PM  Attending note:  Patient seen and examined. Agree with note as above per Toya Smothers, NP. Patient admitted with chest discomfort and palpitations. Telemetry has been unremarkable, cardiac enzymes have been negative. She was found to be somewhat tachycardic as well as hypertensive. We will discontinue lisinopril in favor of Lopressor. She will be started on a low dose of 25 mg twice a day, this can be titrated up as appropriate. We'll refer to outpatient cardiology for further testing as felt appropriate. She has a mild cellulitis which will be treated with oral antibiotics. She has numerous social issues as well as chronic medical conditions. These can be addressed in the outpatient setting.  MEMON,JEHANZEB

## 2013-03-13 ENCOUNTER — Telehealth: Payer: Self-pay | Admitting: Family Medicine

## 2013-03-13 NOTE — Telephone Encounter (Signed)
Left message with pt to return my call °

## 2013-03-13 NOTE — Telephone Encounter (Signed)
Pt went to ER the other day for BP issues. She says that the metoprolol 25 mg is making her feel really bad. She wants to know if you can refill her Lisinopril 10 mg so that she can take that instead of the other.

## 2013-03-13 NOTE — Telephone Encounter (Signed)
She needs to make an OV, she has not been in the OV since December of last year Because of her palpitations I recommend that she continue the beta blocker She can take 1/2 tablet twice a day,

## 2013-03-14 NOTE — Telephone Encounter (Signed)
Left message with pt to return my call °

## 2013-03-16 NOTE — Telephone Encounter (Signed)
LMTRC..03/16/13

## 2013-03-23 ENCOUNTER — Telehealth: Payer: Self-pay | Admitting: Family Medicine

## 2013-03-23 NOTE — Telephone Encounter (Signed)
Spoke with patient and advised her that since Dr. Jeanice Lim is no longer in the same practice with Dr. Lodema Hong, Dr. Lodema Hong can no longer fill rx for her.  She stated that she understands.

## 2013-04-05 ENCOUNTER — Telehealth: Payer: Self-pay | Admitting: Family Medicine

## 2013-04-05 NOTE — Telephone Encounter (Signed)
Left another message needs the medicine for the thrash on tongue and also magic mouthwash and has spoken to [facility] and DR. Jeanice Lim is out of town

## 2013-04-05 NOTE — Telephone Encounter (Signed)
Unable to address.  Patient is not seen here.

## 2013-04-05 NOTE — Telephone Encounter (Signed)
Refill denied.  Per provider pt NTBS and is aware of this

## 2013-04-05 NOTE — Telephone Encounter (Signed)
Patient would like a refill on her Nystatin.   [employer]

## 2013-04-10 ENCOUNTER — Telehealth: Payer: Self-pay | Admitting: Family Medicine

## 2013-04-10 MED ORDER — NYSTATIN 100000 UNIT/ML MT SUSP: 5.0000 mL | Freq: Four times a day (QID) | OROMUCOSAL | Status: DC

## 2013-04-10 MED ORDER — FIRST-DUKES MOUTHWASH MT SUSP: OROMUCOSAL | Status: DC

## 2013-04-10 NOTE — Telephone Encounter (Signed)
Meds refilled.

## 2013-04-10 NOTE — Telephone Encounter (Signed)
Patient needs refills on her magic mouth wash and her nystatin .  [employer] [phone]

## 2013-05-07 ENCOUNTER — Telehealth: Payer: Self-pay | Admitting: Family Medicine

## 2013-05-07 NOTE — Telephone Encounter (Signed)
Patient has been in the hospital and has needed something for the problem she is having with the thrush on her tongue . She is homebound . She was given magic mouth wash in the hospital and it helped . But, was only given enough 3 days. She also would like some nystatin.  Also needs refill on lisinopril 10 mg .      [employer].

## 2013-05-07 NOTE — Telephone Encounter (Signed)
I am aware she was in the hospital 2 months ago, but she has not been in the office since Dec 2013. She needs to make a visit for f/u on her medications or I can not continue to refill this. She will be given lisinopril - 2 month supply The nystatin can be refilled- give  [facility] Magic mouthwash also has nystatin in it, so she does not need both, I recommend she use the nystatin swish and swallow if she has thrush  - You can give 1 refill on [facility] Magic Mouthwash-

## 2013-05-07 NOTE — Telephone Encounter (Signed)
Can I call in magic mouthwash and nystatin for this pt?

## 2013-05-09 MED ORDER — NYSTATIN 100000 UNIT/ML MT SUSP: 5.0000 mL | Freq: Four times a day (QID) | OROMUCOSAL | Status: DC

## 2013-05-09 MED ORDER — FIRST-DUKES MOUTHWASH MT SUSP: OROMUCOSAL | Status: DC

## 2013-05-09 MED ORDER — LISINOPRIL 10 MG PO TABS: 10.0000 mg | ORAL_TABLET | Freq: Every day | ORAL | Status: DC

## 2013-05-09 NOTE — Telephone Encounter (Signed)
Pt is aware needs appt before further refills

## 2013-06-27 ENCOUNTER — Other Ambulatory Visit: Payer: Self-pay | Admitting: Family Medicine

## 2013-06-28 ENCOUNTER — Encounter (HOSPITAL_COMMUNITY): Payer: Self-pay | Admitting: Emergency Medicine

## 2013-06-28 ENCOUNTER — Emergency Department (HOSPITAL_COMMUNITY)
Admission: EM | Admit: 2013-06-28 | Discharge: 2013-06-28 | Disposition: A | Discharge status: Home / Self Care | Payer: Medicare Other | Attending: Emergency Medicine | Admitting: Emergency Medicine

## 2013-06-28 ENCOUNTER — Emergency Department (HOSPITAL_COMMUNITY): Payer: Medicare Other

## 2013-06-28 ENCOUNTER — Other Ambulatory Visit: Payer: Self-pay

## 2013-06-28 DIAGNOSIS — I10 Essential (primary) hypertension: Secondary | ICD-10-CM | POA: Insufficient documentation

## 2013-06-28 DIAGNOSIS — R1013 Epigastric pain: Secondary | ICD-10-CM | POA: Insufficient documentation

## 2013-06-28 DIAGNOSIS — F329 Major depressive disorder, single episode, unspecified: Secondary | ICD-10-CM | POA: Insufficient documentation

## 2013-06-28 DIAGNOSIS — Z87448 Personal history of other diseases of urinary system: Secondary | ICD-10-CM | POA: Insufficient documentation

## 2013-06-28 DIAGNOSIS — R110 Nausea: Secondary | ICD-10-CM | POA: Insufficient documentation

## 2013-06-28 DIAGNOSIS — R002 Palpitations: Secondary | ICD-10-CM

## 2013-06-28 DIAGNOSIS — F3289 Other specified depressive episodes: Secondary | ICD-10-CM | POA: Insufficient documentation

## 2013-06-28 DIAGNOSIS — R197 Diarrhea, unspecified: Secondary | ICD-10-CM | POA: Insufficient documentation

## 2013-06-28 DIAGNOSIS — B370 Candidal stomatitis: Secondary | ICD-10-CM | POA: Insufficient documentation

## 2013-06-28 DIAGNOSIS — R5383 Other fatigue: Secondary | ICD-10-CM

## 2013-06-28 DIAGNOSIS — R143 Flatulence: Secondary | ICD-10-CM

## 2013-06-28 DIAGNOSIS — K219 Gastro-esophageal reflux disease without esophagitis: Secondary | ICD-10-CM | POA: Insufficient documentation

## 2013-06-28 DIAGNOSIS — Z8669 Personal history of other diseases of the nervous system and sense organs: Secondary | ICD-10-CM | POA: Insufficient documentation

## 2013-06-28 DIAGNOSIS — R142 Eructation: Secondary | ICD-10-CM | POA: Insufficient documentation

## 2013-06-28 DIAGNOSIS — R000 Tachycardia, unspecified: Secondary | ICD-10-CM | POA: Insufficient documentation

## 2013-06-28 DIAGNOSIS — Z79899 Other long term (current) drug therapy: Secondary | ICD-10-CM | POA: Insufficient documentation

## 2013-06-28 DIAGNOSIS — R5381 Other malaise: Secondary | ICD-10-CM | POA: Insufficient documentation

## 2013-06-28 DIAGNOSIS — M129 Arthropathy, unspecified: Secondary | ICD-10-CM | POA: Insufficient documentation

## 2013-06-28 DIAGNOSIS — IMO0001 Reserved for inherently not codable concepts without codable children: Secondary | ICD-10-CM | POA: Insufficient documentation

## 2013-06-28 DIAGNOSIS — R141 Gas pain: Secondary | ICD-10-CM | POA: Insufficient documentation

## 2013-06-28 LAB — URINALYSIS, ROUTINE W REFLEX MICROSCOPIC
Bilirubin Urine: NEGATIVE
Glucose, UA: NEGATIVE mg/dL
HGB URINE DIPSTICK: NEGATIVE
Ketones, ur: NEGATIVE mg/dL
Leukocytes, UA: NEGATIVE
Nitrite: NEGATIVE
PH: 6.5 (ref 5.0–8.0)
Protein, ur: NEGATIVE mg/dL
UROBILINOGEN UA: 0.2 mg/dL (ref 0.0–1.0)

## 2013-06-28 LAB — COMPREHENSIVE METABOLIC PANEL
ALT: 37 U/L — AB (ref 0–35)
AST: 29 U/L (ref 0–37)
Albumin: 4.1 g/dL (ref 3.5–5.2)
Alkaline Phosphatase: 107 U/L (ref 39–117)
BUN: 16 mg/dL (ref 6–23)
CALCIUM: 9.9 mg/dL (ref 8.4–10.5)
CO2: 23 meq/L (ref 19–32)
Chloride: 106 mEq/L (ref 96–112)
Creatinine, Ser: 0.92 mg/dL (ref 0.50–1.10)
GFR, EST AFRICAN AMERICAN: 76 mL/min — AB (ref >90)
GFR, EST NON AFRICAN AMERICAN: 66 mL/min — AB (ref >90)
GLUCOSE: 100 mg/dL — AB (ref 70–99)
POTASSIUM: 4 meq/L (ref 3.7–5.3)
Sodium: 144 mEq/L (ref 137–147)
Total Bilirubin: 0.2 mg/dL — ABNORMAL LOW (ref 0.3–1.2)
Total Protein: 8.3 g/dL (ref 6.0–8.3)

## 2013-06-28 LAB — CBC WITH DIFFERENTIAL/PLATELET
Basophils Absolute: 0 10*3/uL (ref 0.0–0.1)
Basophils Relative: 0 % (ref 0–1)
Eosinophils Absolute: 0.1 10*3/uL (ref 0.0–0.7)
Eosinophils Relative: 2 % (ref 0–5)
HCT: 42.4 % (ref 36.0–46.0)
Hemoglobin: 14.6 g/dL (ref 12.0–15.0)
LYMPHS ABS: 1.8 10*3/uL (ref 0.7–4.0)
LYMPHS PCT: 27 % (ref 12–46)
MCH: 29.9 pg (ref 26.0–34.0)
MCHC: 34.4 g/dL (ref 30.0–36.0)
MCV: 86.9 fL (ref 78.0–100.0)
Monocytes Absolute: 0.9 10*3/uL (ref 0.1–1.0)
Monocytes Relative: 13 % — ABNORMAL HIGH (ref 3–12)
NEUTROS ABS: 3.9 10*3/uL (ref 1.7–7.7)
NEUTROS PCT: 58 % (ref 43–77)
PLATELETS: 263 10*3/uL (ref 150–400)
RBC: 4.88 MIL/uL (ref 3.87–5.11)
RDW: 13.4 % (ref 11.5–15.5)
WBC: 6.7 10*3/uL (ref 4.0–10.5)

## 2013-06-28 LAB — TROPONIN I: Troponin I: <0.3 ng/mL (ref <0.30)

## 2013-06-28 MED ORDER — NYSTATIN 100000 UNIT/ML MT SUSP: 500000.0000 [IU] | Freq: Four times a day (QID) | OROMUCOSAL | Status: DC

## 2013-06-28 MED ORDER — SODIUM CHLORIDE 0.9 % IV BOLUS (SEPSIS)
1000.0000 mL | Freq: Once | INTRAVENOUS | Status: AC
  Administered 2013-06-28: 1000 mL via INTRAVENOUS

## 2013-06-28 MED ORDER — FIRST-DUKES MOUTHWASH MT SUSP: OROMUCOSAL | Status: DC

## 2013-06-28 NOTE — ED Provider Notes (Signed)
Date: 06/28/2013  Rate: 107  Rhythm: sinus tachycardia  QRS Axis: normal  Intervals: normal  ST/T Wave abnormalities: normal  Conduction Disutrbances:none      Joya Gaskins, MD 06/28/13 [phone]

## 2013-06-28 NOTE — ED Provider Notes (Signed)
CSN: [id number]     Arrival date & time 06/28/13  4098 History   First MD Initiated Contact with Patient 06/28/13 [phone]     Chief Complaint  Patient presents with  . Diarrhea   (Consider location/radiation/quality/duration/timing/severity/associated sxs/prior Treatment) HPI Comments: Patient is a 63 year old female with a history of HTN, HLD, fibromyalgia, chronic fatigue syndrome, year-old bowel syndrome, and arthritis who presents today for diarrhea. Patient states she started having loose bowel movements 6 hours ago. She endorses associated nausea as well as some mild, nonradiating abdominal discomfort in her epigastric region which has slightly improved since symptom onset. She states that her diarrhea has also improved spontaneously since onset. Patient has not taken anything for her symptoms; she denies any aggravating factors. Patient states that as she continued to have loose stools, she felt as though her heart was "maxing out". This characterized by palpitations and mild chest discomfort which has also improved. Patient denies any chest pain at this time. She further denies associated fever, shortness of breath, extremity numbness, leg swelling, vomiting, melena, hematochezia, and urinary symptoms. Abdominal surgical hx significant for appendectomy, TAH/BSO, and cholecystectomy.  Patient endorses compliance with her antihypertensive medication. She has a FHx of ACS in her mother at age 72 and sister at age 62. Patient was admitted for chest pain r/o in 02/2013. Echo during admission with EF of 60-65%.  Patient is a 63 y.o. female presenting with diarrhea.  Diarrhea Associated symptoms: abdominal pain   Associated symptoms: no fever and no vomiting     Past Medical History  Diagnosis Date  . Arthritis   . Hypertension   . Fibromyalgia   . Chronic fatigue   . Acid reflux   . Degenerative disc disease   . Irritable bowel syndrome (IBS)   . Interstitial cystitis   . Rheumatic fever    . Depression   . Blepharitis     Recurrent  . Vertigo    Past Surgical History  Procedure Laterality Date  . Knee arthroplasty      twice bilateral  . Carpal tunnel release      bilateral  . Wrist arthroplasty    . Cholecystectomy    . Oophorectomy    . Abdominal hysterectomy     Family History  Problem Relation Age of Onset  . Osteoarthritis Mother   . Heart disease Mother   . Hyperlipidemia Mother   . Cancer Father     colon  . Hyperlipidemia Father   . Heart disease Father   . Rheum arthritis Brother   . Osteoarthritis Brother   . Depression Brother   . Heart disease Brother   . Hyperlipidemia Brother   . Cancer Other   . Depression Sister   . Heart disease Sister   . Hyperlipidemia Sister    History  Substance Use Topics  . Smoking status: Never Smoker   . Smokeless tobacco: Not on file  . Alcohol Use: No   OB History   Grav Para Term Preterm Abortions TAB SAB Ect Mult Living   0              Review of Systems  Constitutional: Positive for fatigue (chronic). Negative for fever.  Respiratory: Negative for shortness of breath.   Cardiovascular: Positive for palpitations. Negative for leg swelling.  Gastrointestinal: Positive for nausea, abdominal pain and diarrhea. Negative for vomiting, constipation and blood in stool.  Genitourinary: Negative for dysuria and hematuria.  Neurological: Negative for numbness.  All other systems reviewed  and are negative.    Allergies  Aspirin; Codeine; Demerol; and Nsaids  Home Medications   Current Outpatient Rx  Name  Route  Sig  Dispense  Refill  . acetaminophen (TYLENOL) 500 MG tablet   Oral   Take 250 mg by mouth every 6 (six) hours as needed for mild pain or headache.         . ALPRAZolam (XANAX) 0.25 MG tablet   Oral   Take 0.25 mg by mouth at bedtime as needed for anxiety or sleep.          . Artificial Tear Ointment (ARTIFICIAL TEARS) ointment   Both Eyes   Place 1 drop into both eyes as  needed. For dry eyes         . esomeprazole (NEXIUM) 40 MG capsule   Oral   Take 40 mg by mouth daily before breakfast.         . lisinopril (PRINIVIL,ZESTRIL) 10 MG tablet   Oral   Take 1 tablet (10 mg total) by mouth daily.   30 tablet   2   . Diphenhyd-Hydrocort-Nystatin (FIRST-DUKES MOUTHWASH) SUSP      Swish four times a day for thrush   1 Bottle   0    BP 175/100  Pulse 105  Temp(Src) 98.5 F (36.9 C) (Oral)  Resp 18  SpO2 98%  Physical Exam  Nursing note and vitals reviewed. Constitutional: She is oriented to person, place, and time. She appears well-developed and well-nourished. No distress.  HENT:  Head: Normocephalic and atraumatic.  Mouth/Throat: Uvula is midline and oropharynx is clear and moist. No oral lesions. No oropharyngeal exudate.  +Thrush  Eyes: Conjunctivae and EOM are normal. Pupils are equal, round, and reactive to light. No scleral icterus.  Neck: Normal range of motion. Neck supple.  Cardiovascular: Regular rhythm and normal heart sounds.   Mildly tachycardic; HR 102 bpm  Pulmonary/Chest: Effort normal and breath sounds normal. No respiratory distress. She has no wheezes. She has no rales.  Abdominal: Soft. She exhibits distension. She exhibits no mass. There is tenderness (mild, epigastric). There is no rebound and no guarding.  No peritoneal signs or evidence of acute surgical abdomen  Musculoskeletal: Normal range of motion.  Neurological: She is alert and oriented to person, place, and time.  Skin: Skin is warm and dry. No rash noted. She is not diaphoretic. No erythema. No pallor.  Psychiatric: She has a normal mood and affect. Her behavior is normal.    ED Course  Procedures (including critical care time) Labs Review Labs Reviewed  CBC WITH DIFFERENTIAL - Abnormal; Notable for the following:    Monocytes Relative 13 (*)    All other components within normal limits  COMPREHENSIVE METABOLIC PANEL - Abnormal; Notable for the  following:    Glucose, Bld 100 (*)    ALT 37 (*)    Total Bilirubin 0.2 (*)    GFR calc non Af Amer 66 (*)    GFR calc Af Amer 76 (*)    All other components within normal limits  URINALYSIS, ROUTINE W REFLEX MICROSCOPIC - Abnormal; Notable for the following:    Specific Gravity, Urine <1.005 (*)    All other components within normal limits  TROPONIN I   Imaging Review Dg Chest 2 View  06/28/2013   CLINICAL DATA:  Shortness of Breath  EXAM: CHEST  2 VIEW  COMPARISON:  March 06, 2013  FINDINGS: Lungs are clear. Heart size and pulmonary vascularity are normal.  No adenopathy. No bone lesions.  IMPRESSION: No abnormality noted.   Electronically Signed   By: Bretta Bang M.D.   On: 06/28/2013 10:15    EKG Interpretation   None       MDM   1. Diarrhea   2. Palpitations   3. Oral candidiasis    63 year old female presents for diarrhea and associated palpitations. She has not had any bowel movements since arrival 2 hours ago. She denies bloody bowel movements. Physical exam today significant only for mild tenderness in the epigastric region which is stable over serial abdominal exams. Patient's labs today without leukocytosis, anemia, or electrolyte imbalance. Liver and kidney function preserved. Urinalysis does not suggest infection.  Patient has a negative cardiac workup today with a stable EKG and negative troponin. Her symptoms sound atypical for ACS and have completely resolved since arrival in ED. Chest x-ray shows no mediastinal widening, pneumothorax, pleural effusion, or focal consolidation. Most recent echo from September 2014 with normal EF between 60-65%. Doubt atypical PE as patient without tachypnea, dyspnea, or hypoxia.  Patient stable for discharge today with primary care followup. Will prescribe nystatin rinse for persistent oral thrush. Have recommended the patient increase her daily dose of fiber as well as her daily fluid intake. Return precautions provided and  patient agreeable to plan.    Antony Madura, PA-C 06/28/13 1047

## 2013-06-28 NOTE — ED Provider Notes (Signed)
Medical screening examination/treatment/procedure(s) were conducted as a shared visit with non-physician practitioner(s) and myself.  I personally evaluated the patient during the encounter.  EKG Interpretation   None         Joya Gaskins, MD 06/28/13 1351

## 2013-06-28 NOTE — ED Notes (Signed)
To ED from home via EMS, c/o "heart maxing out" and diarrhea, ST 110, 210/118 per EMS, A/O X4, ambulatory and in NAD

## 2013-06-28 NOTE — Telephone Encounter (Signed)
Denied.  NTBS.

## 2013-06-28 NOTE — Discharge Instructions (Signed)
Recommend increasing the fiber in your diet. Drink plenty of fluids. Use Nystatin rinse for thrush. Follow up with your primary care doctor regarding your symptoms. Follow up with [facility] cardiology as advised at your discharge back in September.  Diarrhea Diarrhea is frequent loose and watery bowel movements. It can cause you to feel weak and dehydrated. Dehydration can cause you to become tired and thirsty, have a dry mouth, and have decreased urination that often is dark yellow. Diarrhea is a sign of another problem, most often an infection that will not last long. In most cases, diarrhea typically lasts 2 3 days. However, it can last longer if it is a sign of something more serious. It is important to treat your diarrhea as directed by your caregive to lessen or prevent future episodes of diarrhea. CAUSES  Some common causes include:  Gastrointestinal infections caused by viruses, bacteria, or parasites.  Food poisoning or food allergies.  Certain medicines, such as antibiotics, chemotherapy, and laxatives.  Artificial sweeteners and fructose.  Digestive disorders. HOME CARE INSTRUCTIONS  Ensure adequate fluid intake (hydration): have 1 cup (8 oz) of fluid for each diarrhea episode. Avoid fluids that contain simple sugars or sports drinks, fruit juices, whole milk products, and sodas. Your urine should be clear or pale yellow if you are drinking enough fluids. Hydrate with an oral rehydration solution that you can purchase at pharmacies, retail stores, and online. You can prepare an oral rehydration solution at home by mixing the following ingredients together:    tsp table salt.   tsp baking soda.   tsp salt substitute containing potassium chloride.  1  tablespoons sugar.  1 L (34 oz) of water.  Certain foods and beverages may increase the speed at which food moves through the gastrointestinal (GI) tract. These foods and beverages should be avoided and include:  Caffeinated and  alcoholic beverages.  High-fiber foods, such as raw fruits and vegetables, nuts, seeds, and whole grain breads and cereals.  Foods and beverages sweetened with sugar alcohols, such as xylitol, sorbitol, and mannitol.  Some foods may be well tolerated and may help thicken stool including:  Starchy foods, such as rice, toast, pasta, low-sugar cereal, oatmeal, grits, baked potatoes, crackers, and bagels.  Bananas.  Applesauce.  Add probiotic-rich foods to help increase healthy bacteria in the GI tract, such as yogurt and fermented milk products.  Wash your hands well after each diarrhea episode.  Only take over-the-counter or prescription medicines as directed by your caregiver.  Take a warm bath to relieve any burning or pain from frequent diarrhea episodes. SEEK IMMEDIATE MEDICAL CARE IF:   You are unable to keep fluids down.  You have persistent vomiting.  You have blood in your stool, or your stools are black and tarry.  You do not urinate in 6 8 hours, or there is only a small amount of very dark urine.  You have abdominal pain that increases or localizes.  You have weakness, dizziness, confusion, or lightheadedness.  You have a severe headache.  Your diarrhea gets worse or does not get better.  You have a fever or persistent symptoms for more than 2 3 days.  You have a fever and your symptoms suddenly get worse. MAKE SURE YOU:   Understand these instructions.  Will watch your condition.  Will get help right away if you are not doing well or get worse. Document Released: 05/21/2002 Document Revised: 05/17/2012 Document Reviewed: 02/06/2012 [facility] Patient Information 2014 [place], [place].

## 2013-06-28 NOTE — ED Provider Notes (Signed)
Patient seen/examined in the Emergency Department in conjunction with Midlevel Provider [facility] Patient reports diarrhea and palpitations Exam : awake/alert, no distress, walking around the room, stable at this time Plan: labs/imaging pending.  Likely d/c home and f/u as outpatient    Joya Gaskins, MD 06/28/13 [phone]

## 2013-12-20 ENCOUNTER — Emergency Department (HOSPITAL_COMMUNITY): Payer: Medicare Other

## 2013-12-20 ENCOUNTER — Encounter (HOSPITAL_COMMUNITY): Payer: Self-pay | Admitting: Emergency Medicine

## 2013-12-20 ENCOUNTER — Emergency Department (HOSPITAL_COMMUNITY)
Admission: EM | Admit: 2013-12-20 | Discharge: 2013-12-20 | Disposition: A | Discharge status: Home / Self Care | Payer: Medicare Other | Attending: Emergency Medicine | Admitting: Emergency Medicine

## 2013-12-20 DIAGNOSIS — F329 Major depressive disorder, single episode, unspecified: Secondary | ICD-10-CM | POA: Insufficient documentation

## 2013-12-20 DIAGNOSIS — Z79899 Other long term (current) drug therapy: Secondary | ICD-10-CM | POA: Insufficient documentation

## 2013-12-20 DIAGNOSIS — I10 Essential (primary) hypertension: Secondary | ICD-10-CM | POA: Insufficient documentation

## 2013-12-20 DIAGNOSIS — M069 Rheumatoid arthritis, unspecified: Secondary | ICD-10-CM | POA: Insufficient documentation

## 2013-12-20 DIAGNOSIS — F3289 Other specified depressive episodes: Secondary | ICD-10-CM | POA: Insufficient documentation

## 2013-12-20 DIAGNOSIS — Z87448 Personal history of other diseases of urinary system: Secondary | ICD-10-CM | POA: Insufficient documentation

## 2013-12-20 DIAGNOSIS — K219 Gastro-esophageal reflux disease without esophagitis: Secondary | ICD-10-CM | POA: Insufficient documentation

## 2013-12-20 LAB — CBC WITH DIFFERENTIAL/PLATELET
Basophils Absolute: 0.1 10*3/uL (ref 0.0–0.1)
Basophils Relative: 1 % (ref 0–1)
EOS ABS: 0.4 10*3/uL (ref 0.0–0.7)
EOS PCT: 5 % (ref 0–5)
HEMATOCRIT: 41.6 % (ref 36.0–46.0)
Hemoglobin: 14.2 g/dL (ref 12.0–15.0)
LYMPHS PCT: 44 % (ref 12–46)
Lymphs Abs: 3.6 10*3/uL (ref 0.7–4.0)
MCH: 30.3 pg (ref 26.0–34.0)
MCHC: 34.1 g/dL (ref 30.0–36.0)
MCV: 88.9 fL (ref 78.0–100.0)
MONO ABS: 0.8 10*3/uL (ref 0.1–1.0)
MONOS PCT: 10 % (ref 3–12)
Neutro Abs: 3.3 10*3/uL (ref 1.7–7.7)
Neutrophils Relative %: 40 % — ABNORMAL LOW (ref 43–77)
Platelets: 306 10*3/uL (ref 150–400)
RBC: 4.68 MIL/uL (ref 3.87–5.11)
RDW: 13.8 % (ref 11.5–15.5)
WBC: 8.1 10*3/uL (ref 4.0–10.5)

## 2013-12-20 LAB — BASIC METABOLIC PANEL
ANION GAP: 15 (ref 5–15)
BUN: 16 mg/dL (ref 6–23)
CHLORIDE: 103 meq/L (ref 96–112)
CO2: 23 meq/L (ref 19–32)
CREATININE: 1.06 mg/dL (ref 0.50–1.10)
Calcium: 10 mg/dL (ref 8.4–10.5)
GFR calc Af Amer: 64 mL/min — ABNORMAL LOW (ref >90)
GFR calc non Af Amer: 55 mL/min — ABNORMAL LOW (ref >90)
GLUCOSE: 105 mg/dL — AB (ref 70–99)
Potassium: 4 mEq/L (ref 3.7–5.3)
Sodium: 141 mEq/L (ref 137–147)

## 2013-12-20 LAB — TROPONIN I: Troponin I: <0.3 ng/mL (ref <0.30)

## 2013-12-20 NOTE — ED Provider Notes (Signed)
CSN: [id number]     Arrival date & time 12/20/13  1436 History  This chart was scribed for Pamela Diego, MD by Elby Beck, ED Scribe. This patient was seen in room APA12/APA12 and the patient's care was started at 3:00 PM.   Chief Complaint  Patient presents with  . Chest Pain    Patient is a 63 y.o. female presenting with hypertension. The history is provided by the patient (the pt states her bp was high at the doctors office so she was sent here). No language interpreter was used.  Hypertension This is a recurrent problem. The current episode started 3 to 5 hours ago. The problem occurs rarely. The problem has been resolved. Pertinent negatives include no chest pain, no abdominal pain and no headaches. Nothing aggravates the symptoms. Nothing relieves the symptoms.    HPI Comments: Pamela Vasquez is a 63 y.o. Female with a history of HTN and fibromyalgia brought by EMS to the Emergency Department complaining of intermittent, moderate left-sided chest pain onset earlier today. She describes the pain as discomfort. She states that her pain has partially resolved. She expresses concern that her blood pressure was higher than usual today, and she states that she has a prior history of similar chest pain when her blood pressure becomes high.   Past Medical History  Diagnosis Date  . Arthritis   . Hypertension   . Fibromyalgia   . Chronic fatigue   . Acid reflux   . Degenerative disc disease   . Irritable bowel syndrome (IBS)   . Interstitial cystitis   . Rheumatic fever   . Depression   . Blepharitis     Recurrent  . Vertigo    Past Surgical History  Procedure Laterality Date  . Knee arthroplasty      twice bilateral  . Carpal tunnel release      bilateral  . Wrist arthroplasty    . Cholecystectomy    . Oophorectomy    . Abdominal hysterectomy     Family History  Problem Relation Age of Onset  . Osteoarthritis Mother   . Heart disease Mother   . Hyperlipidemia Mother    . Cancer Father     colon  . Hyperlipidemia Father   . Heart disease Father   . Rheum arthritis Brother   . Osteoarthritis Brother   . Depression Brother   . Heart disease Brother   . Hyperlipidemia Brother   . Cancer Other   . Depression Sister   . Heart disease Sister   . Hyperlipidemia Sister    History  Substance Use Topics  . Smoking status: Never Smoker   . Smokeless tobacco: Not on file  . Alcohol Use: No   OB History   Grav Para Term Preterm Abortions TAB SAB Ect Mult Living   0              Review of Systems  Constitutional: Negative for appetite change and fatigue.  HENT: Negative for congestion, ear discharge and sinus pressure.   Eyes: Negative for discharge.  Respiratory: Negative for cough.   Cardiovascular: Negative for chest pain.  Gastrointestinal: Negative for abdominal pain and diarrhea.  Genitourinary: Negative for frequency and hematuria.  Musculoskeletal: Negative for back pain.  Skin: Negative for rash.  Neurological: Negative for seizures and headaches.  Psychiatric/Behavioral: Negative for hallucinations.    Allergies  Aspirin; Codeine; Demerol; and Nsaids  Home Medications   Prior to Admission medications   Medication Sig Start  Date End Date Taking? Authorizing Provider  acetaminophen (TYLENOL) 500 MG tablet Take 250 mg by mouth every 6 (six) hours as needed for mild pain or headache.    Historical Provider, MD  ALPRAZolam Duanne Moron) 0.25 MG tablet Take 0.25 mg by mouth at bedtime as needed for anxiety or sleep.     Historical Provider, MD  Artificial Tear Ointment (ARTIFICIAL TEARS) ointment Place 1 drop into both eyes as needed. For dry eyes    Historical Provider, MD  Diphenhyd-Hydrocort-Nystatin (FIRST-DUKES MOUTHWASH) SUSP Swish four times a day for thrush 06/28/13   Antonietta Breach, PA-C  esomeprazole (NEXIUM) 40 MG capsule Take 40 mg by mouth daily before breakfast.    Historical Provider, MD  lisinopril (PRINIVIL,ZESTRIL) 10 MG tablet  Take 1 tablet (10 mg total) by mouth daily. 05/09/13   Alycia Rossetti, MD  nystatin (MYCOSTATIN) 100000 UNIT/ML suspension Take 5 mLs (500,000 Units total) by mouth 4 (four) times daily. 06/28/13   Antonietta Breach, PA-C   Ht 5\' 4"  (1.626 m)  Wt 155 lb (70.308 kg)  BMI 26.59 kg/m2  Physical Exam  Constitutional: She is oriented to person, place, and time. She appears well-developed.  HENT:  Head: Normocephalic.  Eyes: Conjunctivae and EOM are normal. No scleral icterus.  Neck: Neck supple. No thyromegaly present.  Cardiovascular: Normal rate and regular rhythm.  Exam reveals no gallop and no friction rub.   No murmur heard. Pulmonary/Chest: No stridor. She has no wheezes. She has no rales. She exhibits no tenderness.  Abdominal: She exhibits no distension. There is no tenderness. There is no rebound.  Musculoskeletal: Normal range of motion. She exhibits no edema.  Lymphadenopathy:    She has no cervical adenopathy.  Neurological: She is oriented to person, place, and time. She exhibits normal muscle tone. Coordination normal.  Skin: No rash noted. No erythema.  Psychiatric: She has a normal mood and affect. Her behavior is normal.    ED Course  Procedures (including critical care time)  DIAGNOSTIC STUDIES:  COORDINATION OF CARE: 3:04 PM- Pt advised of plan for treatment and pt agrees.  Labs Review Labs Reviewed  CBC WITH DIFFERENTIAL - Abnormal; Notable for the following:    Neutrophils Relative % 40 (*)    All other components within normal limits  TROPONIN I  BASIC METABOLIC PANEL    Imaging Review No results found.   EKG Interpretation   Date/Time:  Thursday December 20 2013 14:45:52 EDT Ventricular Rate:  110 PR Interval:  145 QRS Duration: 88 QT Interval:  328 QTC Calculation: 444 R Axis:   75 Text Interpretation:  Sinus tachycardia Confirmed by Chue Berkovich  MD, Catina Nuss  ([id number]) on 12/20/2013 6:44:20 PM      MDM   Final diagnoses:  None   Htn,  Follow up with  pcp next week.  Pamela Diego, MD 12/20/13 [phone]

## 2013-12-20 NOTE — ED Notes (Signed)
Complain of left sided chest and shoulder pain. Pt states when her blood pressure is elevated her chest hurts on the left side.

## 2013-12-20 NOTE — Discharge Instructions (Signed)
Record your bp daily and call your md next week with the results

## 2014-01-06 ENCOUNTER — Other Ambulatory Visit: Payer: Self-pay | Admitting: Family Medicine

## 2014-01-07 ENCOUNTER — Encounter: Payer: Self-pay | Admitting: *Deleted

## 2014-01-07 NOTE — Telephone Encounter (Signed)
Refill denied.   Patient has not been seen since 05/2012, and was made aware that no further refills would be given on 05/07/2013.  Letter sent.

## 2014-08-10 ENCOUNTER — Emergency Department (HOSPITAL_COMMUNITY)
Admission: EM | Admit: 2014-08-10 | Discharge: 2014-08-10 | Disposition: A | Discharge status: Home / Self Care | Payer: Medicare Other | Attending: Emergency Medicine | Admitting: Emergency Medicine

## 2014-08-10 ENCOUNTER — Encounter (HOSPITAL_COMMUNITY): Payer: Self-pay | Admitting: Emergency Medicine

## 2014-08-10 DIAGNOSIS — K088 Other specified disorders of teeth and supporting structures: Secondary | ICD-10-CM | POA: Diagnosis present

## 2014-08-10 DIAGNOSIS — F329 Major depressive disorder, single episode, unspecified: Secondary | ICD-10-CM | POA: Insufficient documentation

## 2014-08-10 DIAGNOSIS — Z87448 Personal history of other diseases of urinary system: Secondary | ICD-10-CM | POA: Diagnosis not present

## 2014-08-10 DIAGNOSIS — Z79899 Other long term (current) drug therapy: Secondary | ICD-10-CM | POA: Insufficient documentation

## 2014-08-10 DIAGNOSIS — Z8669 Personal history of other diseases of the nervous system and sense organs: Secondary | ICD-10-CM | POA: Diagnosis not present

## 2014-08-10 DIAGNOSIS — M1990 Unspecified osteoarthritis, unspecified site: Secondary | ICD-10-CM | POA: Insufficient documentation

## 2014-08-10 DIAGNOSIS — I10 Essential (primary) hypertension: Secondary | ICD-10-CM | POA: Insufficient documentation

## 2014-08-10 DIAGNOSIS — K219 Gastro-esophageal reflux disease without esophagitis: Secondary | ICD-10-CM | POA: Insufficient documentation

## 2014-08-10 DIAGNOSIS — K0889 Other specified disorders of teeth and supporting structures: Secondary | ICD-10-CM

## 2014-08-10 MED ORDER — PENICILLIN V POTASSIUM 500 MG PO TABS: 500.0000 mg | ORAL_TABLET | Freq: Four times a day (QID) | ORAL | Status: AC

## 2014-08-10 MED ORDER — NYSTATIN 100000 UNIT/ML MT SUSP: 500000.0000 [IU] | Freq: Four times a day (QID) | OROMUCOSAL | Status: DC

## 2014-08-10 MED ORDER — PENICILLIN V POTASSIUM 250 MG PO TABS
500.0000 mg | ORAL_TABLET | Freq: Once | ORAL | Status: AC
  Administered 2014-08-10: 500 mg via ORAL
  Filled 2014-08-10: qty 2

## 2014-08-10 NOTE — Discharge Instructions (Signed)

## 2014-08-10 NOTE — ED Provider Notes (Signed)
CSN: [id number]     Arrival date & time 08/10/14  1044 History   First MD Initiated Contact with Patient 08/10/14 1106     Chief Complaint  Patient presents with  . Dental Pain     Patient is a 64 y.o. female presenting with tooth pain. The history is provided by the patient.  Dental Pain Location:  Lower Severity:  Moderate Onset quality:  Gradual Duration:  1 week Timing:  Constant Progression:  Worsening Chronicity:  New Relieved by:  Nothing Worsened by:  Jaw movement and pressure Associated symptoms: no fever     Past Medical History  Diagnosis Date  . Arthritis   . Hypertension   . Fibromyalgia   . Chronic fatigue   . Acid reflux   . Degenerative disc disease   . Irritable bowel syndrome (IBS)   . Interstitial cystitis   . Rheumatic fever   . Depression   . Blepharitis     Recurrent  . Vertigo    Past Surgical History  Procedure Laterality Date  . Knee arthroplasty      twice bilateral  . Carpal tunnel release      bilateral  . Wrist arthroplasty    . Cholecystectomy    . Oophorectomy    . Abdominal hysterectomy     Family History  Problem Relation Age of Onset  . Osteoarthritis Mother   . Heart disease Mother   . Hyperlipidemia Mother   . Cancer Father     colon  . Hyperlipidemia Father   . Heart disease Father   . Rheum arthritis Brother   . Osteoarthritis Brother   . Depression Brother   . Heart disease Brother   . Hyperlipidemia Brother   . Cancer Other   . Depression Sister   . Heart disease Sister   . Hyperlipidemia Sister    History  Substance Use Topics  . Smoking status: Never Smoker   . Smokeless tobacco: Never Used  . Alcohol Use: No   OB History    Gravida Para Term Preterm AB TAB SAB Ectopic Multiple Living   2 2 2       2      Review of Systems  Constitutional: Negative for fever.  HENT:       Chronic thrush for years   Gastrointestinal: Negative for vomiting.      Allergies  Aspirin; Codeine; Demerol; and  Nsaids  Home Medications   Prior to Admission medications   Medication Sig Start Date End Date Taking? Authorizing Provider  acetaminophen (TYLENOL) 500 MG tablet Take 500 mg by mouth every 6 (six) hours as needed for mild pain or headache.    Yes Historical Provider, MD  ALPRAZolam ) 0.25 MG tablet Take 0.25 mg by mouth at bedtime as needed for anxiety or sleep.    Yes Historical Provider, MD  Artificial Tear Ointment (ARTIFICIAL TEARS) ointment Place 1 drop into both eyes as needed. For dry eyes   Yes Historical Provider, MD  lisinopril (PRINIVIL,ZESTRIL) 10 MG tablet Take 10 mg by mouth 2 (two) times daily.   Yes Historical Provider, MD  omeprazole (PRILOSEC OTC) 20 MG tablet Take 20 mg by mouth daily.   Yes Historical Provider, MD  nystatin (MYCOSTATIN) 100000 UNIT/ML suspension Take 5 mLs (500,000 Units total) by mouth 4 (four) times daily. 08/10/14   08/12/14, MD  penicillin v potassium (VEETID) 500 MG tablet Take 1 tablet (500 mg total) by mouth 4 (four) times daily. 08/10/14  08/17/14  Joya Gaskins, MD   BP 178/90 mmHg  Pulse 104  Temp(Src) 98.4 F (36.9 C) (Oral)  Resp 18  Ht 5\' 4"  (1.626 m)  Wt 154 lb (69.854 kg)  BMI 26.42 kg/m2  SpO2 99% Physical Exam CONSTITUTIONAL: Well developed/well nourished HEAD AND FACE: Normocephalic/atraumatic EYES: EOMI/PERRL ENMT: Mucous membranes moist.  Poor dentition.  No trismus.  No focal abscess noted.  Decayed tooth noted.  Mild thrush noted to tongue NECK: supple no meningeal signs CV: S1/S2 noted, no murmurs/rubs/gallops noted LUNGS: Lungs are clear to auscultation bilaterally, no apparent distress ABDOMEN: soft, nontender, no rebound or guarding NEURO: Pt is awake/alert, moves all extremitiesx4 EXTREMITIES:full ROM SKIN: warm, color normal  ED Course  Procedures   Medications  penicillin v potassium (VEETID) tablet 500 mg (500 mg Oral Given 08/10/14 1119)   Pt requests nystatin Rx in case her thrush worsens  (this is chronic per patient) She does not want any pain meds    MDM   Final diagnoses:  Pain, dental    Nursing notes including past medical history and social history reviewed and considered in documentation     08/12/14, MD 08/10/14 1314

## 2014-08-10 NOTE — ED Notes (Signed)
Patient c/o left lower dental pain and swelling x1 week. Per patient pain progressively getting worse.

## 2014-09-30 ENCOUNTER — Encounter (HOSPITAL_COMMUNITY): Payer: Self-pay | Admitting: *Deleted

## 2014-09-30 ENCOUNTER — Emergency Department (HOSPITAL_COMMUNITY)
Admission: EM | Admit: 2014-09-30 | Discharge: 2014-09-30 | Disposition: A | Discharge status: Home / Self Care | Payer: Medicare Other | Attending: Emergency Medicine | Admitting: Emergency Medicine

## 2014-09-30 DIAGNOSIS — K219 Gastro-esophageal reflux disease without esophagitis: Secondary | ICD-10-CM | POA: Insufficient documentation

## 2014-09-30 DIAGNOSIS — F329 Major depressive disorder, single episode, unspecified: Secondary | ICD-10-CM | POA: Diagnosis not present

## 2014-09-30 DIAGNOSIS — R197 Diarrhea, unspecified: Secondary | ICD-10-CM | POA: Insufficient documentation

## 2014-09-30 DIAGNOSIS — Z8739 Personal history of other diseases of the musculoskeletal system and connective tissue: Secondary | ICD-10-CM | POA: Diagnosis not present

## 2014-09-30 DIAGNOSIS — I10 Essential (primary) hypertension: Secondary | ICD-10-CM | POA: Insufficient documentation

## 2014-09-30 DIAGNOSIS — Z87448 Personal history of other diseases of urinary system: Secondary | ICD-10-CM | POA: Insufficient documentation

## 2014-09-30 DIAGNOSIS — Z8669 Personal history of other diseases of the nervous system and sense organs: Secondary | ICD-10-CM | POA: Insufficient documentation

## 2014-09-30 DIAGNOSIS — R109 Unspecified abdominal pain: Secondary | ICD-10-CM | POA: Insufficient documentation

## 2014-09-30 LAB — CBC WITH DIFFERENTIAL/PLATELET
BASOS ABS: 0 10*3/uL (ref 0.0–0.1)
BASOS PCT: 0 % (ref 0–1)
Eosinophils Absolute: 0.1 10*3/uL (ref 0.0–0.7)
Eosinophils Relative: 1 % (ref 0–5)
HCT: 45.4 % (ref 36.0–46.0)
HEMOGLOBIN: 15.6 g/dL — AB (ref 12.0–15.0)
Lymphocytes Relative: 14 % (ref 12–46)
Lymphs Abs: 1.1 10*3/uL (ref 0.7–4.0)
MCH: 30.7 pg (ref 26.0–34.0)
MCHC: 34.4 g/dL (ref 30.0–36.0)
MCV: 89.4 fL (ref 78.0–100.0)
Monocytes Absolute: 0.5 10*3/uL (ref 0.1–1.0)
Monocytes Relative: 6 % (ref 3–12)
NEUTROS ABS: 6.2 10*3/uL (ref 1.7–7.7)
Neutrophils Relative %: 79 % — ABNORMAL HIGH (ref 43–77)
Platelets: 250 10*3/uL (ref 150–400)
RBC: 5.08 MIL/uL (ref 3.87–5.11)
RDW: 13.1 % (ref 11.5–15.5)
WBC: 7.8 10*3/uL (ref 4.0–10.5)

## 2014-09-30 LAB — COMPREHENSIVE METABOLIC PANEL
ALT: 42 U/L — ABNORMAL HIGH (ref 0–35)
ANION GAP: 10 (ref 5–15)
AST: 35 U/L (ref 0–37)
Albumin: 4.3 g/dL (ref 3.5–5.2)
Alkaline Phosphatase: 108 U/L (ref 39–117)
BUN: 15 mg/dL (ref 6–23)
CO2: 24 mmol/L (ref 19–32)
Calcium: 9.6 mg/dL (ref 8.4–10.5)
Chloride: 106 mmol/L (ref 96–112)
Creatinine, Ser: 0.89 mg/dL (ref 0.50–1.10)
GFR, EST AFRICAN AMERICAN: 79 mL/min — AB (ref >90)
GFR, EST NON AFRICAN AMERICAN: 68 mL/min — AB (ref >90)
GLUCOSE: 117 mg/dL — AB (ref 70–99)
POTASSIUM: 3.9 mmol/L (ref 3.5–5.1)
Sodium: 140 mmol/L (ref 135–145)
Total Bilirubin: 0.5 mg/dL (ref 0.3–1.2)
Total Protein: 8 g/dL (ref 6.0–8.3)

## 2014-09-30 LAB — LIPASE, BLOOD: LIPASE: 25 U/L (ref 11–59)

## 2014-09-30 NOTE — ED Notes (Signed)
Pt c/o diarrhea x 1 hour ago with some abdominal cramping and nausea

## 2014-09-30 NOTE — ED Provider Notes (Signed)
CSN: [id number]     Arrival date & time 09/30/14  0533 History   First MD Initiated Contact with Patient 09/30/14 0541     Chief Complaint  Patient presents with  . Diarrhea     Patient is a 64 y.o. female presenting with diarrhea. The history is provided by the patient.  Diarrhea Quality:  Watery Severity:  Moderate Number of episodes:  3 Duration:  2 hours Timing:  Intermittent Progression:  Unchanged Relieved by:  None tried Worsened by:  Nothing tried Associated symptoms: abdominal pain   Associated symptoms: no fever and no vomiting   Pt reports onset of non-bloody diarrhea about 2 hrs ago She reports nausea but no vomiting No fever She reports feeling "bloated" and abdominal cramping She last had antibiotics over 2 months ago for dental pain No recent travel No h/o c. dificile infections  Past Medical History  Diagnosis Date  . Arthritis   . Hypertension   . Fibromyalgia   . Chronic fatigue   . Acid reflux   . Degenerative disc disease   . Irritable bowel syndrome (IBS)   . Interstitial cystitis   . Rheumatic fever   . Depression   . Blepharitis     Recurrent  . Vertigo    Past Surgical History  Procedure Laterality Date  . Knee arthroplasty      twice bilateral  . Carpal tunnel release      bilateral  . Wrist arthroplasty    . Cholecystectomy    . Oophorectomy    . Abdominal hysterectomy     Family History  Problem Relation Age of Onset  . Osteoarthritis Mother   . Heart disease Mother   . Hyperlipidemia Mother   . Cancer Father     colon  . Hyperlipidemia Father   . Heart disease Father   . Rheum arthritis Brother   . Osteoarthritis Brother   . Depression Brother   . Heart disease Brother   . Hyperlipidemia Brother   . Cancer Other   . Depression Sister   . Heart disease Sister   . Hyperlipidemia Sister    History  Substance Use Topics  . Smoking status: Never Smoker   . Smokeless tobacco: Never Used  . Alcohol Use: No   OB  History    Gravida Para Term Preterm AB TAB SAB Ectopic Multiple Living   2 2 2       2      Review of Systems  Constitutional: Positive for fatigue. Negative for fever.  Respiratory:       Chronic cough   Gastrointestinal: Positive for abdominal pain and diarrhea. Negative for vomiting and blood in stool.  All other systems reviewed and are negative.     Allergies  Aspirin; Codeine; Demerol; and Nsaids  Home Medications   Prior to Admission medications   Medication Sig Start Date End Date Taking? Authorizing Provider  acetaminophen (TYLENOL) 500 MG tablet Take 500 mg by mouth every 6 (six) hours as needed for mild pain or headache.     Historical Provider, MD  ALPRAZolam ) 0.25 MG tablet Take 0.25 mg by mouth at bedtime as needed for anxiety or sleep.     Historical Provider, MD  Artificial Tear Ointment (ARTIFICIAL TEARS) ointment Place 1 drop into both eyes as needed. For dry eyes    Historical Provider, MD  lisinopril (PRINIVIL,ZESTRIL) 10 MG tablet Take 10 mg by mouth 2 (two) times daily.    Historical Provider, MD  nystatin (MYCOSTATIN) 100000 UNIT/ML suspension Take 5 mLs (500,000 Units total) by mouth 4 (four) times daily. 08/10/14   Zadie Rhine, MD  omeprazole (PRILOSEC OTC) 20 MG tablet Take 20 mg by mouth daily.    Historical Provider, MD   BP 163/87 mmHg  Pulse 104  Temp(Src) 98.3 F (36.8 C) (Oral)  Resp 16  Ht 5\' 4"  (1.626 m)  Wt 153 lb (69.4 kg)  BMI 26.25 kg/m2  SpO2 96% Physical Exam CONSTITUTIONAL: Well developed/well nourished HEAD: Normocephalic/atraumatic EYES: EOMI/PERRL, no icterus ENMT: Mucous membranes moist NECK: supple no meningeal signs SPINE/BACK:entire spine nontender CV: S1/S2 noted, no murmurs/rubs/gallops noted LUNGS: Lungs are clear to auscultation bilaterally, no apparent distress ABDOMEN: soft, nontender, no rebound or guarding GU:no cva tenderness NEURO: Pt is awake/alert/appropriate, moves all extremitiesx4.  No facial  droop.   EXTREMITIES: pulses normal/equal, full ROM SKIN: warm, color normal PSYCH: no abnormalities of mood noted, alert and oriented to situation  ED Course  Procedures  5:56 AM Pt well appearing/nontoxic Will check labs and screen for dehydration She is afebrile and no focal abd tenderness Will try PO fluids 7:41 AM Pt ambulatory Labs reassuring She is well appearing and nontoxic and no focal abdominal tenderness She is requesting breakfast She is appropriate for d/c home   Labs Review Labs Reviewed  COMPREHENSIVE METABOLIC PANEL - Abnormal; Notable for the following:    Glucose, Bld 117 (*)    ALT 42 (*)    GFR calc non Af Amer 68 (*)    GFR calc Af Amer 79 (*)    All other components within normal limits  CBC WITH DIFFERENTIAL/PLATELET - Abnormal; Notable for the following:    Hemoglobin 15.6 (*)    Neutrophils Relative % 79 (*)    All other components within normal limits  LIPASE, BLOOD     MDM   Final diagnoses:  Diarrhea    Nursing notes including past medical history and social history reviewed and considered in documentation Labs/vital reviewed myself and considered during evaluation     , MD 09/30/14 [phone]

## 2014-12-17 ENCOUNTER — Emergency Department (HOSPITAL_COMMUNITY)
Admission: EM | Admit: 2014-12-17 | Discharge: 2014-12-17 | Disposition: A | Discharge status: Home / Self Care | Payer: Medicare Other | Attending: Emergency Medicine | Admitting: Emergency Medicine

## 2014-12-17 ENCOUNTER — Encounter (HOSPITAL_COMMUNITY): Payer: Self-pay | Admitting: Emergency Medicine

## 2014-12-17 DIAGNOSIS — K0381 Cracked tooth: Secondary | ICD-10-CM | POA: Insufficient documentation

## 2014-12-17 DIAGNOSIS — Z79899 Other long term (current) drug therapy: Secondary | ICD-10-CM | POA: Diagnosis not present

## 2014-12-17 DIAGNOSIS — Z8659 Personal history of other mental and behavioral disorders: Secondary | ICD-10-CM | POA: Insufficient documentation

## 2014-12-17 DIAGNOSIS — Z8739 Personal history of other diseases of the musculoskeletal system and connective tissue: Secondary | ICD-10-CM | POA: Insufficient documentation

## 2014-12-17 DIAGNOSIS — G8929 Other chronic pain: Secondary | ICD-10-CM | POA: Insufficient documentation

## 2014-12-17 DIAGNOSIS — I10 Essential (primary) hypertension: Secondary | ICD-10-CM | POA: Insufficient documentation

## 2014-12-17 DIAGNOSIS — K029 Dental caries, unspecified: Secondary | ICD-10-CM | POA: Insufficient documentation

## 2014-12-17 DIAGNOSIS — Z8669 Personal history of other diseases of the nervous system and sense organs: Secondary | ICD-10-CM | POA: Insufficient documentation

## 2014-12-17 DIAGNOSIS — K219 Gastro-esophageal reflux disease without esophagitis: Secondary | ICD-10-CM | POA: Insufficient documentation

## 2014-12-17 DIAGNOSIS — K0889 Other specified disorders of teeth and supporting structures: Secondary | ICD-10-CM

## 2014-12-17 DIAGNOSIS — K088 Other specified disorders of teeth and supporting structures: Secondary | ICD-10-CM | POA: Insufficient documentation

## 2014-12-17 DIAGNOSIS — Z8742 Personal history of other diseases of the female genital tract: Secondary | ICD-10-CM | POA: Diagnosis not present

## 2014-12-17 MED ORDER — MAGIC MOUTHWASH W/LIDOCAINE: 5.0000 mL | Freq: Three times a day (TID) | ORAL | Status: DC | PRN

## 2014-12-17 MED ORDER — PENICILLIN V POTASSIUM 500 MG PO TABS: 500.0000 mg | ORAL_TABLET | Freq: Four times a day (QID) | ORAL | Status: AC

## 2014-12-17 NOTE — Discharge Instructions (Signed)
Dental Pain °Toothache is pain in or around a tooth. It may get worse with chewing or with cold or heat.  °HOME CARE °· Your dentist may use a numbing medicine during treatment. If so, you may need to avoid eating until the medicine wears off. Ask your dentist about this. °· Only take medicine as told by your dentist or doctor. °· Avoid chewing food near the painful tooth until after all treatment is done. Ask your dentist about this. °GET HELP RIGHT AWAY IF:  °· The problem gets worse or new problems appear. °· You have a fever. °· There is redness and puffiness (swelling) of the face, jaw, or neck. °· You cannot open your mouth. °· There is pain in the jaw. °· There is very bad pain that is not helped by medicine. °MAKE SURE YOU:  °· Understand these instructions. °· Will watch your condition. °· Will get help right away if you are not doing well or get worse. °Document Released: 11/17/2007 Document Revised: 08/23/2011 Document Reviewed: 11/17/2007 °ExitCare® Patient Information ©2015 [employer]. This information is not intended to replace advice given to you by your health care provider. Make sure you discuss any questions you have with your health care provider. ° ° ° °Emergency Department Resource Guide °1) Find a Doctor and Pay Out of Pocket °Although you won't have to find out who is covered by your insurance plan, it is a good idea to ask around and get recommendations. You will then need to call the office and see if the doctor you have chosen will accept you as a new patient and what types of options they offer for patients who are self-pay. Some doctors offer discounts or will set up payment plans for their patients who do not have insurance, but you will need to ask so you aren't surprised when you get to your appointment. ° °2) Contact Your Local Health Department °Not all health departments have doctors that can see patients for sick visits, but many do, so it is worth a call to see if yours does.  If you don't know where your local health department is, you can check in your phone book. The CDC also has a tool to help you locate your state's health department, and many state websites also have listings of all of their local health departments. ° °3) Find a Walk-in Clinic °If your illness is not likely to be very severe or complicated, you may want to try a walk in clinic. These are popping up all over the country in pharmacies, drugstores, and shopping centers. They're usually staffed by nurse practitioners or physician assistants that have been trained to treat common illnesses and complaints. They're usually fairly quick and inexpensive. However, if you have serious medical issues or chronic medical problems, these are probably not your best option. ° °No Primary Care Doctor: °- Call [facility] at  832-8000 - they can help you locate a primary care doctor that  accepts your insurance, provides certain services, etc. °- Physician Referral Service- 1-800-533-3463 ° °Chronic Pain Problems: °Organization         Address  Phone   Notes  °[facility]  (336) 297-2271 Patients need to be referred by their primary care doctor.  ° °Medication Assistance: °Organization         Address  Phone   Notes  °[place] [facility] [address] °[place], [place] [address] (336) 641-8030 --Must be a resident   of [place] °-- Must have NO insurance coverage whatsoever (no Medicaid/ Medicare, etc.) °-- The pt. MUST have a primary care doctor that directs their care regularly and follows them in the community °  °[facility]  (866) 331-1348   °[employer]  (888) 892-1162   ° °Agencies that provide inexpensive medical care: °Organization         Address  Phone   Notes  °[facility]  (336) 832-8035   °[facility]    (336) 832-7272   °[facility] [address] °[place], [place] [address] (336) 832-4777   °[facility] [address], °[place] (336) 271-4999   °[facility]    (336) 373-0678   °[facility]    (336) 272-1050   °[facility] ° [address], [place] Phone:  (336) 832-4444, Fax:  (336) 832-4440 Hours of Operation:  9 am - 6 pm, M-F.  Also accepts Medicaid/Medicare and self-pay.  °[facility] ° [address], [place] Phone: (336) 832-3150, Fax: (336) 832-3151. Hours of Operation:  8:30 am - 5:30 pm, M-F.  Also accepts Medicaid and self-pay.  °[facility] [place] [address], [place] Phone: (336) 878-6027   °[facility] [address], [place], [place] (336)723-1848, Ext. 123 Mondays & Thursdays: 7-9 AM.  First 15 patients are seen on a first come, first serve basis. °  ° °Medicaid-accepting [place] Providers: ° °Organization         Address  Phone   Notes  °[facility] [address], [place] (336) 641-2100 Also accepts self-pay patients.  °[facility] [address], [place] ° (336) 856-9996   °[facility] [address], [place] (336) 288-8857   °[facility] [address] [place] [address], [place] (336) 299-7000   °Veita Bland [address], [place]  ° (336) 373-1557 Only accepts [place] Medicaid patients after they have their name applied to their card.  ° °Self-Pay (no insurance) in [place]: ° °Organization         Address  Phone   Notes  °Sickle Cell Patients, [facility] [address], [place] (336) 832-1970   °[facility] [address], [place] (336) 832-4400   °[facility] ° [address], [place] (336) 992-4800   °[facility]/Dr. Osei-Bonsu ° [address] [place] [address], [place] or [address], [place] (336) 841-8500  Phone number for both [place] and [place] locations is the same.  °[facility] [address], [place] (336) 299-0000   °[facility] [address] [place] [address], [place] or [address] (336) 852-7530 °(336) 878-2260   °[facility] [address], [place] (336) 350-1642, phone; (336) 294-5005, fax Sees patients 1st and 3rd Saturday of every month.  Must not qualify for public or private insurance (i.e. Medicaid, Medicare,  Health Choice, Veterans' Benefits) • Household income should be no more than 200% of the poverty level •The clinic cannot treat you if you are pregnant or think you are pregnant • Sexually transmitted diseases are not treated at the clinic.  ° ° °Dental Care: °Organization         Address    Phone  Notes  °[facility] [address], [place] (336) 641-6152 Accepts children up to age 21 who are enrolled in Medicaid or [place] Health Choice; pregnant women with a Medicaid card; and children who have applied for Medicaid or [place] Health Choice, but were declined, whose parents can pay a reduced fee at time of service.  °[facility] [place]  [address], [place] (336) 641-7733 Accepts children up to age 21 who are enrolled in Medicaid or [place] Health Choice; pregnant women with a Medicaid card; and children who have applied for Medicaid or [place] Health Choice, but were declined, whose parents can pay a reduced fee at time of service.  °[facility] ° [address], [place] (336) 641-4533 Patients are seen by appointment only. Walk-ins are not accepted. [facility] will see patients 18 years of age and older. °Monday - Tuesday (8am-5pm) °Most Wednesdays (8:30-5pm) °$30 per visit, cash only  °[facility] ° [address], [place] (336) 641-4533 Patients are seen by appointment  only. Walk-ins are not accepted. [facility] will see patients 18 years of age and older. °One Wednesday Evening (Monthly: Volunteer Based).  $30 per visit, cash only  °[facility]  (919) 537-3737 for adults; Children under age 4, call Graduate Pediatric Dentistry at (919) [phone]. Children aged 4-14, please call (919) [phone] to request a pediatric application. ° Dental services are provided in all areas of dental care including fillings, crowns and bridges, complete and partial dentures, implants, gum treatment, root canals, and extractions. Preventive care is also provided. Treatment is provided to both adults and children. °Patients are selected via a lottery and there is often a waiting list. °  °[facility] [address], °[place] ° (336) 763-8833 [email or web address] °  °[facility] [address], [place], [place] (336)723-1848, Ext. 123 Second and Fourth Thursday of each month, opens at 6:30 AM; Clinic ends at 9 AM.  Patients are seen on a first-come first-served basis, and a limited number are seen during each clinic.  ° °[facility] ° [address], [place], [place] (336) 723-7904   Eligibility Requirements °You must have lived in [place], [place], or [place] counties for at least the last three months. °  You cannot be eligible for state or federal sponsored healthcare insurance, including Veterans Administration, Medicaid, or Medicare. °  You generally cannot be eligible for healthcare insurance through your employer.  °  How to apply: °Eligibility screenings are held every Tuesday and Wednesday afternoon from 1:00 pm until 4:00 pm. You do not need an appointment for the interview!  °[facility] [address], [place], [place] 336-631-2330   °[facility]  336-342-8273   °[facility]  336-703-3100   °[facility]  336-570-6415   ° °Behavioral Health  Resources in the Community: °Intensive Outpatient Programs °Organization         Address  Phone  Notes  °[facility] [address], [place], [place] 336-878-6098   °[facility] [address], [place], [place] 336-832-9800   °[facility] [address], [place], [place] ° 336-882-2125   °[facility] [address],  °[place], [place] 1-800-853-5163 or 336-641-4981   °Substance Abuse Resources °Organization           Address  Phone  Notes  °[facility]  336-882-2125   °[facility]  336-784-9470   °[facility]  336-285-9073   °[facility]  336-845-3988   °Residential & Outpatient Substance Abuse Program  1-800-659-3381   °Psychological Services °Organization         Address  Phone  Notes  °[facility]  336- 832-9600   °[facility]  336- 378-7881   °[facility] [address], [place] 1-800-853-5163 or 336-641-4981   ° °Mobile Crisis Teams °Organization         Address  Phone  Notes  °[facility], Mobile Crisis Care Unit  1-877-626-1772   °Assertive °[facility] ° [address] [place], [place] 336-834-9664   °Sharon DeEsch [address] °[place] 336-554-5454   ° °Self-Help/Support Groups °Organization         Address  Phone             Notes  °[facility] [place] - variety of support groups  336- 373-1402 Call for more information  °Narcotics Anonymous (NA), [facility] [address], °[place]  2 meetings at this location  ° °Residential Treatment Programs °Organization         Address  Phone  Notes  °[facility] [address],    °[place]  1-866-801-8205   °[facility] ° [address], [place], [place] 704-293-8524   °[facility] [address], [place] 336-845-3988 Admissions: 8am-3pm M-F  °[facility] [address]    °[place], [place] 336-841-1104   °[facility] [address], [place], [place] 336-379-7146   °[facility] [address]  °[place], [place] 336-285-9073   °[facility] Intensive Outpatient [address], [place], [place] 336-852-3033   °[facility] [address]  °[place], [place] 1-877-615-2722 or 336-784-9470   °[facility] ([facility] [address] , [place] 336-227-7417 Accepts Medicaid  °[facility] [address]  °[place] 1-800-659-3381 Substance Abuse/Addiction Treatment  ° °[place] [facility] Resources °Organization         Address  Phone  Notes  °[facility]  (888) 581-9988   °Julie Brannon, PhD [address] [place], [place]   (336) 349-5553 or (336) 951-0000   °[facility]   [address] °[place], [place] (336) 349-4454   °[facility] [address], [place], [place] (336) 342-8316 Insurance/Medicaid/sponsorship through [facility]  °[facility] [address]                                    [place], [place] (336) 342-8316 Therapy/tele-psych/case  °[facility] [address]  ° [place], [place] (336) 349-2233    °Dr. Arfeen  (336) 349-4544   °[facility] [place]  [employer] [place] [facility] 1) [address],  °2) [address], [place] °3)  [address], [place] (336) 349-3220 °(336) 342-7768 ° °(336) 342-8140   °[place] [facility] (336) 342-1394 or (336) 342-3537 (After Hours)    ° °  °

## 2014-12-17 NOTE — ED Notes (Signed)
PT c/o left lower dental pain x7 days.

## 2014-12-17 NOTE — ED Provider Notes (Signed)
CSN: [id number]     Arrival date & time 12/17/14  1207 History   First MD Initiated Contact with Patient 12/17/14 1232     Chief Complaint  Patient presents with  . Dental Pain     (Consider location/radiation/quality/duration/timing/severity/associated sxs/prior Treatment) HPI   Pamela Vasquez is a 64 y.o. female who presents to the Emergency Department complaining of chronic, recurring dental pain.  She reports waxing and waning pain of the left lower tooth pain.  She reports dental decay and symptoms for one year and worsening , constant pain for one week.  When asked about dental f/u, patient states that she has fibromyalgia and chronic fatigue syndrome and dental appointments are difficulty due to her level of constant fatigue.  She denies fever, chills, difficulty swallowing, facial swelling or neck pain.  Symptom relief with tylenol    Past Medical History  Diagnosis Date  . Arthritis   . Hypertension   . Fibromyalgia   . Chronic fatigue   . Acid reflux   . Degenerative disc disease   . Irritable bowel syndrome (IBS)   . Interstitial cystitis   . Rheumatic fever   . Depression   . Blepharitis     Recurrent  . Vertigo    Past Surgical History  Procedure Laterality Date  . Knee arthroplasty      twice bilateral  . Carpal tunnel release      bilateral  . Wrist arthroplasty    . Cholecystectomy    . Oophorectomy    . Abdominal hysterectomy     Family History  Problem Relation Age of Onset  . Osteoarthritis Mother   . Heart disease Mother   . Hyperlipidemia Mother   . Cancer Father     colon  . Hyperlipidemia Father   . Heart disease Father   . Rheum arthritis Brother   . Osteoarthritis Brother   . Depression Brother   . Heart disease Brother   . Hyperlipidemia Brother   . Cancer Other   . Depression Sister   . Heart disease Sister   . Hyperlipidemia Sister    History  Substance Use Topics  . Smoking status: Never Smoker   . Smokeless tobacco: Never  Used  . Alcohol Use: No   OB History    Gravida Para Term Preterm AB TAB SAB Ectopic Multiple Living   2 2 2       2      Review of Systems  Constitutional: Negative for fever and appetite change.  HENT: Positive for dental problem. Negative for congestion, facial swelling, sore throat and trouble swallowing.   Eyes: Negative for pain and visual disturbance.  Musculoskeletal: Negative for neck pain and neck stiffness.  Neurological: Negative for dizziness, facial asymmetry and headaches.  Hematological: Negative for adenopathy.  All other systems reviewed and are negative.     Allergies  Aspirin; Codeine; Demerol; and Nsaids  Home Medications   Prior to Admission medications   Medication Sig Start Date End Date Taking? Authorizing Provider  acetaminophen (TYLENOL) 500 MG tablet Take 500 mg by mouth every 6 (six) hours as needed for mild pain or headache.    Yes Historical Provider, MD  Artificial Tear Ointment (ARTIFICIAL TEARS) ointment Place 1 drop into both eyes as needed. For dry eyes   Yes Historical Provider, MD  lisinopril (PRINIVIL,ZESTRIL) 10 MG tablet Take 10 mg by mouth 2 (two) times daily.   Yes Historical Provider, MD  omeprazole (PRILOSEC OTC) 20 MG tablet  Take 20 mg by mouth daily.   Yes Historical Provider, MD   BP 180/102 mmHg  Pulse 102  Temp(Src) 98.6 F (37 C) (Oral)  Resp 16  Ht 5\' 4"  (1.626 m)  Wt 153 lb (69.4 kg)  BMI 26.25 kg/m2  SpO2 99% Physical Exam  Constitutional: She is oriented to person, place, and time. She appears well-developed and well-nourished. No distress.  HENT:  Head: Normocephalic and atraumatic.  Right Ear: Tympanic membrane and ear canal normal.  Left Ear: Tympanic membrane and ear canal normal.  Mouth/Throat: Uvula is midline, oropharynx is clear and moist and mucous membranes are normal. No trismus in the jaw. Dental caries present. No dental abscesses or uvula swelling.    Previous dental fracture of the left lower second  premolar.  Mild erythema and edema of the surrounding gums without obvious abscess.    No facial swelling, obvious dental abscess, trismus, or sublingual abnml.    Neck: Normal range of motion. Neck supple.  Cardiovascular: Normal rate, regular rhythm and normal heart sounds.   No murmur heard. Pulmonary/Chest: Effort normal and breath sounds normal.  Musculoskeletal: Normal range of motion.  Lymphadenopathy:    She has no cervical adenopathy.  Neurological: She is alert and oriented to person, place, and time. She exhibits normal muscle tone. Coordination normal.  Skin: Skin is warm and dry.  Nursing note and vitals reviewed.   ED Course  Procedures (including critical care time) Labs Review Labs Reviewed - No data to display  Imaging Review No results found.   EKG Interpretation None      MDM   Final diagnoses:  Pain, dental    Pt is well appearing.  Non-toxic.  No concerning sx's for Ludwig's angina.  Strongly advised pt to schedule appt with her dentist.  Stable for d/c    , PA-C 12/18/14 1729  02/18/15, MD 12/20/14 1531

## 2015-01-16 ENCOUNTER — Emergency Department (HOSPITAL_COMMUNITY)
Admission: EM | Admit: 2015-01-16 | Discharge: 2015-01-16 | Disposition: A | Discharge status: Home / Self Care | Payer: Medicare Other | Attending: Emergency Medicine | Admitting: Emergency Medicine

## 2015-01-16 ENCOUNTER — Encounter (HOSPITAL_COMMUNITY): Payer: Self-pay | Admitting: Emergency Medicine

## 2015-01-16 DIAGNOSIS — I10 Essential (primary) hypertension: Secondary | ICD-10-CM | POA: Insufficient documentation

## 2015-01-16 DIAGNOSIS — K1379 Other lesions of oral mucosa: Secondary | ICD-10-CM | POA: Diagnosis not present

## 2015-01-16 DIAGNOSIS — R200 Anesthesia of skin: Secondary | ICD-10-CM | POA: Insufficient documentation

## 2015-01-16 DIAGNOSIS — K0889 Other specified disorders of teeth and supporting structures: Secondary | ICD-10-CM

## 2015-01-16 DIAGNOSIS — M797 Fibromyalgia: Secondary | ICD-10-CM | POA: Insufficient documentation

## 2015-01-16 DIAGNOSIS — K219 Gastro-esophageal reflux disease without esophagitis: Secondary | ICD-10-CM | POA: Diagnosis not present

## 2015-01-16 DIAGNOSIS — R51 Headache: Secondary | ICD-10-CM | POA: Diagnosis not present

## 2015-01-16 DIAGNOSIS — F329 Major depressive disorder, single episode, unspecified: Secondary | ICD-10-CM | POA: Diagnosis not present

## 2015-01-16 DIAGNOSIS — K088 Other specified disorders of teeth and supporting structures: Secondary | ICD-10-CM | POA: Insufficient documentation

## 2015-01-16 DIAGNOSIS — F419 Anxiety disorder, unspecified: Secondary | ICD-10-CM | POA: Insufficient documentation

## 2015-01-16 DIAGNOSIS — Z87448 Personal history of other diseases of urinary system: Secondary | ICD-10-CM | POA: Diagnosis not present

## 2015-01-16 DIAGNOSIS — L02215 Cutaneous abscess of perineum: Secondary | ICD-10-CM | POA: Diagnosis not present

## 2015-01-16 DIAGNOSIS — Z79899 Other long term (current) drug therapy: Secondary | ICD-10-CM | POA: Diagnosis not present

## 2015-01-16 DIAGNOSIS — K612 Anorectal abscess: Secondary | ICD-10-CM

## 2015-01-16 MED ORDER — TRAMADOL HCL 50 MG PO TABS
50.0000 mg | ORAL_TABLET | Freq: Four times a day (QID) | ORAL | Status: DC | PRN
Start: 2015-01-16 — End: 2015-04-20

## 2015-01-16 MED ORDER — FLUCONAZOLE 10 MG/ML PO SUSR: 200.0000 mg | Freq: Every day | ORAL | Status: AC

## 2015-01-16 MED ORDER — MAGIC MOUTHWASH W/LIDOCAINE: 5.0000 mL | Freq: Three times a day (TID) | ORAL | Status: DC | PRN

## 2015-01-16 NOTE — ED Provider Notes (Signed)
CSN: [id number]     Arrival date & time 01/16/15  1539 History   First MD Initiated Contact with Patient 01/16/15 1559     Chief Complaint  Patient presents with  . Dental Pain     (Consider location/radiation/quality/duration/timing/severity/associated sxs/prior Treatment) HPI   Patient presents with 2 complaints. Patient has ongoing left lower mouth pain. She has poor dentition in this area, states that over the past few days she has had increasing pain, focally in this region. There is no new difficulty breathing, speaking, swallowing, no new inability to handle secretions. No new swelling. No fevers, chills. Minimal relief with Tylenol, and the patient ran out of Magic mouthwash.  Patient is scheduled to see a dentist in 4 days.  Second complaint, patient has abscess in her perineal region, she notes recurrent episodes of similar events, and she is largely bedbound secondary to fibromyalgia, chronic fatigue syndrome. Unclear when this abscess began to form, since onset it has been minimally uncomfortable, worse with ambulation.  Past Medical History  Diagnosis Date  . Arthritis   . Hypertension   . Fibromyalgia   . Chronic fatigue   . Acid reflux   . Degenerative disc disease   . Irritable bowel syndrome (IBS)   . Interstitial cystitis   . Rheumatic fever   . Depression   . Blepharitis     Recurrent  . Vertigo    Past Surgical History  Procedure Laterality Date  . Knee arthroplasty      twice bilateral  . Carpal tunnel release      bilateral  . Wrist arthroplasty    . Cholecystectomy    . Oophorectomy    . Abdominal hysterectomy     Family History  Problem Relation Age of Onset  . Osteoarthritis Mother   . Heart disease Mother   . Hyperlipidemia Mother   . Cancer Father     colon  . Hyperlipidemia Father   . Heart disease Father   . Rheum arthritis Brother   . Osteoarthritis Brother   . Depression Brother   . Heart disease Brother   .  Hyperlipidemia Brother   . Cancer Other   . Depression Sister   . Heart disease Sister   . Hyperlipidemia Sister    History  Substance Use Topics  . Smoking status: Never Smoker   . Smokeless tobacco: Never Used  . Alcohol Use: No   OB History    Gravida Para Term Preterm AB TAB SAB Ectopic Multiple Living   2 2 2       2      Review of Systems  Constitutional: Positive for fatigue.  HENT: Positive for dental problem and mouth sores. Negative for congestion and drooling.   Respiratory: Negative for chest tightness and shortness of breath.   Cardiovascular: Negative for chest pain.  Gastrointestinal: Negative for nausea, vomiting and rectal pain.  Genitourinary: Negative for dysuria.  Musculoskeletal: Positive for myalgias and arthralgias.  Skin: Positive for wound.  Allergic/Immunologic: Positive for environmental allergies and food allergies.  Neurological: Positive for weakness, numbness and headaches.  Psychiatric/Behavioral: The patient is nervous/anxious.       Allergies  Aspirin; Codeine; Demerol; and Nsaids  Home Medications   Prior to Admission medications   Medication Sig Start Date End Date Taking? Authorizing Provider  acetaminophen (TYLENOL) 500 MG tablet Take 500 mg by mouth every 6 (six) hours as needed for mild pain or headache.     Historical Provider, MD  Alum &  Mag Hydroxide-Simeth (MAGIC MOUTHWASH W/LIDOCAINE) SOLN Take 5 mLs by mouth 3 (three) times daily as needed for mouth pain. Swish and spit, do not swallow 01/16/15   Gerhard Munch, MD  Artificial Tear Ointment (ARTIFICIAL TEARS) ointment Place 1 drop into both eyes as needed. For dry eyes    Historical Provider, MD  lisinopril (PRINIVIL,ZESTRIL) 10 MG tablet Take 10 mg by mouth 2 (two) times daily.    Historical Provider, MD  omeprazole (PRILOSEC OTC) 20 MG tablet Take 20 mg by mouth daily.    Historical Provider, MD  traMADol (ULTRAM) 50 MG tablet Take 1 tablet (50 mg total) by mouth every 6 (six)  hours as needed. 01/16/15   Gerhard Munch, MD   BP 185/88 mmHg  Pulse 91  Temp(Src) 98.3 F (36.8 C)  Resp 19  Ht 5\' 4"  (1.626 m)  Wt 154 lb (69.854 kg)  BMI 26.42 kg/m2  SpO2 98% Physical Exam  Constitutional: She is oriented to person, place, and time. She appears well-developed and well-nourished. No distress.  Middle-aged female who appears older than stated age, resting in bed  HENT:  Head: Normocephalic and atraumatic.  Mouth/Throat:    Eyes: Conjunctivae and EOM are normal.  Neck: No rigidity. No edema, no erythema and normal range of motion present. No thyromegaly present.  Cardiovascular: Normal rate and regular rhythm.   Pulmonary/Chest: Effort normal and breath sounds normal. No stridor. No respiratory distress.  Abdominal: She exhibits no distension.  Musculoskeletal: She exhibits no edema.  Neurological: She is alert and oriented to person, place, and time. No cranial nerve deficit.  Skin: Skin is warm and dry.     Psychiatric: She has a normal mood and affect.  Nursing note and vitals reviewed.   ED Course  Procedures (including critical care time) Chart review demonstrates visit here one month ago for similar concerns.  MDM   Final diagnoses:  Pain, dental  Abscess of anal or rectal region   Patient presents with concern of ongoing dental pain, perineal abscess. The patient has no evidence for local infection, abscess, and the patient is afebrile. Similarly, there is no evidence for cellulitis or complicated abscess in the perineum. No drainable abscess. Patient was counseled on the need to use Magic mouthwash, be sure to follow-up with her dentist. Patient was also counseled on home care for her small skin abscess.  , MD 01/16/15 [phone]

## 2015-01-16 NOTE — ED Notes (Signed)
Pt had dental pain on lower left side, has appointment Tuesday. Pt also complaining of abscess to groin left side

## 2015-01-16 NOTE — Discharge Instructions (Signed)
As discussed, with your ongoing dental pain, poor dentition, and is very important that you follow-up with your dentist as scheduled next week.  Please continue to use the Magic mouthwash, for relief in addition to the provided medication.  With your abscess please use Epsom salt baths, 4 times daily to speed recovery.  Return here for concerning changes in your condition.

## 2015-04-20 ENCOUNTER — Emergency Department (HOSPITAL_COMMUNITY): Payer: Medicare Other

## 2015-04-20 ENCOUNTER — Emergency Department (HOSPITAL_COMMUNITY)
Admission: EM | Admit: 2015-04-20 | Discharge: 2015-04-20 | Disposition: A | Discharge status: Home / Self Care | Payer: Medicare Other | Attending: Emergency Medicine | Admitting: Emergency Medicine

## 2015-04-20 ENCOUNTER — Encounter (HOSPITAL_COMMUNITY): Payer: Self-pay | Admitting: Emergency Medicine

## 2015-04-20 DIAGNOSIS — Z87448 Personal history of other diseases of urinary system: Secondary | ICD-10-CM | POA: Insufficient documentation

## 2015-04-20 DIAGNOSIS — B370 Candidal stomatitis: Secondary | ICD-10-CM | POA: Insufficient documentation

## 2015-04-20 DIAGNOSIS — Z8669 Personal history of other diseases of the nervous system and sense organs: Secondary | ICD-10-CM | POA: Insufficient documentation

## 2015-04-20 DIAGNOSIS — M797 Fibromyalgia: Secondary | ICD-10-CM | POA: Diagnosis not present

## 2015-04-20 DIAGNOSIS — K219 Gastro-esophageal reflux disease without esophagitis: Secondary | ICD-10-CM | POA: Diagnosis not present

## 2015-04-20 DIAGNOSIS — R05 Cough: Secondary | ICD-10-CM | POA: Diagnosis not present

## 2015-04-20 DIAGNOSIS — Z79899 Other long term (current) drug therapy: Secondary | ICD-10-CM | POA: Diagnosis not present

## 2015-04-20 DIAGNOSIS — I10 Essential (primary) hypertension: Secondary | ICD-10-CM | POA: Diagnosis not present

## 2015-04-20 DIAGNOSIS — Z8659 Personal history of other mental and behavioral disorders: Secondary | ICD-10-CM | POA: Diagnosis not present

## 2015-04-20 DIAGNOSIS — R5383 Other fatigue: Secondary | ICD-10-CM | POA: Diagnosis not present

## 2015-04-20 DIAGNOSIS — R1310 Dysphagia, unspecified: Secondary | ICD-10-CM | POA: Diagnosis present

## 2015-04-20 LAB — CBC WITH DIFFERENTIAL/PLATELET
BASOS PCT: 1 %
Basophils Absolute: 0 10*3/uL (ref 0.0–0.1)
EOS PCT: 2 %
Eosinophils Absolute: 0.2 10*3/uL (ref 0.0–0.7)
HEMATOCRIT: 44.7 % (ref 36.0–46.0)
Hemoglobin: 15.3 g/dL — ABNORMAL HIGH (ref 12.0–15.0)
Lymphocytes Relative: 29 %
Lymphs Abs: 2.1 10*3/uL (ref 0.7–4.0)
MCH: 30.3 pg (ref 26.0–34.0)
MCHC: 34.2 g/dL (ref 30.0–36.0)
MCV: 88.5 fL (ref 78.0–100.0)
MONO ABS: 0.7 10*3/uL (ref 0.1–1.0)
MONOS PCT: 10 %
Neutro Abs: 4.3 10*3/uL (ref 1.7–7.7)
Neutrophils Relative %: 58 %
Platelets: 288 10*3/uL (ref 150–400)
RBC: 5.05 MIL/uL (ref 3.87–5.11)
RDW: 13.5 % (ref 11.5–15.5)
WBC: 7.4 10*3/uL (ref 4.0–10.5)

## 2015-04-20 LAB — BASIC METABOLIC PANEL
Anion gap: 9 (ref 5–15)
BUN: 19 mg/dL (ref 6–20)
CALCIUM: 9.8 mg/dL (ref 8.9–10.3)
CO2: 26 mmol/L (ref 22–32)
CREATININE: 0.81 mg/dL (ref 0.44–1.00)
Chloride: 105 mmol/L (ref 101–111)
GFR calc non Af Amer: >60 mL/min (ref >60)
Glucose, Bld: 105 mg/dL — ABNORMAL HIGH (ref 65–99)
Potassium: 3.7 mmol/L (ref 3.5–5.1)
Sodium: 140 mmol/L (ref 135–145)

## 2015-04-20 MED ORDER — FLUCONAZOLE 100 MG PO TABS
150.0000 mg | ORAL_TABLET | Freq: Once | ORAL | Status: AC
  Administered 2015-04-20: 150 mg via ORAL
  Filled 2015-04-20: qty 2

## 2015-04-20 MED ORDER — FLUCONAZOLE 200 MG PO TABS: 200.0000 mg | ORAL_TABLET | Freq: Every day | ORAL | Status: AC

## 2015-04-20 NOTE — Discharge Instructions (Signed)
Continue daily blood pressure log. Make an appointment to follow back up with your primary care doctor. Referral provided to ear nose and throat regarding the oral thrush and the difficulty swallowing. Return for any new or worse symptoms. Blood pressure showed signs of improvement here. Take the Diflucan as directed.

## 2015-04-20 NOTE — ED Notes (Signed)
Pt arrived with multiple complaints. States her blood pressure was elevated 170's/90s in which she took her lisinopril PTA. Pt also c/o chronic dry cough for "years". Pt also c/o of thrush since September. States she has not been to her PCP in 18 months. NAD. Pt refused to walk across the hall to bathroom. Pt states she needs a BSC due to chronic fatigue. BSC provided. Pt requesting food and drink on arrival as well. Inoformed pt that she would need to wait until EDP evaluates her.

## 2015-04-20 NOTE — ED Notes (Signed)
Pt is attempting to find a ride home. Pt has been discharged. Secretary previously called down for a breakfast tray but it still has not arrived. Pt is adamant that she does not want to leave without eating. Tech went down stairs to obtain tray. Tray at bedside. Pt informed to press call bell when she is finished eating so she may be taken to the waiting area.

## 2015-04-20 NOTE — ED Notes (Signed)
Pt c/o cough, generalized weakness and htn.

## 2015-04-20 NOTE — ED Provider Notes (Signed)
CSN: [id number]     Arrival date & time 04/20/15  2353 History   First MD Initiated Contact with Patient 04/20/15 0720     Chief Complaint  Patient presents with  . Cough     (Consider location/radiation/quality/duration/timing/severity/associated sxs/prior Treatment) Patient is a 64 y.o. female presenting with cough. The history is provided by the patient.  Cough Associated symptoms: myalgias   Associated symptoms: no chest pain, no headaches, no rash and no shortness of breath    patient came in by EMS. Patient with several concerns. The main one is elevated blood pressure today with systolics around 180. Patient has a known history of hypertension and is on lisinopril. Patient prior to coming in to take her lisinopril medicine. Patient also with concerns of oral thrush and difficulty swallowing. Patient Maurine Minister has treated her with Magic mouthwash and now with of oral nystatin. The thrush is not clearing.  Patient with history of chronic fatigue and fibromyalgia.  Patient denies any chest pain shortness of breath headache or neuro focal deficits.  Past Medical History  Diagnosis Date  . Arthritis   . Hypertension   . Fibromyalgia   . Chronic fatigue   . Acid reflux   . Degenerative disc disease   . Irritable bowel syndrome (IBS)   . Interstitial cystitis   . Rheumatic fever   . Depression   . Blepharitis     Recurrent  . Vertigo    Past Surgical History  Procedure Laterality Date  . Knee arthroplasty      twice bilateral  . Carpal tunnel release      bilateral  . Wrist arthroplasty    . Cholecystectomy    . Oophorectomy    . Abdominal hysterectomy     Family History  Problem Relation Age of Onset  . Osteoarthritis Mother   . Heart disease Mother   . Hyperlipidemia Mother   . Cancer Father     colon  . Hyperlipidemia Father   . Heart disease Father   . Rheum arthritis Brother   . Osteoarthritis Brother   . Depression Brother   . Heart disease Brother   .  Hyperlipidemia Brother   . Cancer Other   . Depression Sister   . Heart disease Sister   . Hyperlipidemia Sister    Social History  Substance Use Topics  . Smoking status: Never Smoker   . Smokeless tobacco: Never Used  . Alcohol Use: No   OB History    Gravida Para Term Preterm AB TAB SAB Ectopic Multiple Living   2 2 2       2      Review of Systems  Constitutional: Positive for fatigue.  HENT: Positive for trouble swallowing. Negative for congestion.   Eyes: Negative for redness.  Respiratory: Positive for cough. Negative for shortness of breath.   Cardiovascular: Negative for chest pain.  Gastrointestinal: Negative for abdominal pain.  Genitourinary: Negative for dysuria.  Musculoskeletal: Positive for myalgias.  Skin: Negative for rash.  Neurological: Negative for headaches.  Hematological: Does not bruise/bleed easily.  Psychiatric/Behavioral: Negative for confusion.      Allergies  Aspirin; Codeine; Demerol; and Nsaids  Home Medications   Prior to Admission medications   Medication Sig Start Date End Date Taking? Authorizing Provider  acetaminophen (TYLENOL) 500 MG tablet Take 500 mg by mouth every 6 (six) hours as needed for mild pain or headache.     Historical Provider, MD  Alum & Mag Hydroxide-Simeth (MAGIC MOUTHWASH W/LIDOCAINE)  SOLN Take 5 mLs by mouth 3 (three) times daily as needed for mouth pain. Swish and spit, do not swallow 01/16/15   Gerhard Munch, MD  Artificial Tear Ointment (ARTIFICIAL TEARS) ointment Place 1 drop into both eyes as needed. For dry eyes    Historical Provider, MD  fluconazole (DIFLUCAN) 200 MG tablet Take 1 tablet (200 mg total) by mouth daily. 04/20/15 05/04/15  Vanetta Mulders, MD  lisinopril (PRINIVIL,ZESTRIL) 10 MG tablet Take 10 mg by mouth 2 (two) times daily.    Historical Provider, MD  omeprazole (PRILOSEC OTC) 20 MG tablet Take 20 mg by mouth daily.    Historical Provider, MD  traMADol (ULTRAM) 50 MG tablet Take 1 tablet (50  mg total) by mouth every 6 (six) hours as needed. 01/16/15   Gerhard Munch, MD   BP 142/89 mmHg  Pulse 100  Temp(Src) 98.5 F (36.9 C) (Oral)  Resp 19  SpO2 99% Physical Exam  Constitutional: She is oriented to person, place, and time. She appears well-developed and well-nourished. No distress.  HENT:  Head: Normocephalic and atraumatic.  Evidence of oral thrush on tongue and hypopharynx area.  Eyes: Conjunctivae and EOM are normal. Pupils are equal, round, and reactive to light.  Neck: Neck supple.  Cardiovascular: Normal rate, regular rhythm and normal heart sounds.   Pulmonary/Chest: Effort normal and breath sounds normal. No respiratory distress.  Abdominal: Soft. Bowel sounds are normal. There is no tenderness.  Musculoskeletal: Normal range of motion.  Neurological: She is oriented to person, place, and time. No cranial nerve deficit. She exhibits normal muscle tone. Coordination normal.  Skin: Skin is warm. No rash noted.  Nursing note and vitals reviewed.   ED Course  Procedures (including critical care time) Labs Review Labs Reviewed  CBC WITH DIFFERENTIAL/PLATELET - Abnormal; Notable for the following:    Hemoglobin 15.3 (*)    All other components within normal limits  BASIC METABOLIC PANEL - Abnormal; Notable for the following:    Glucose, Bld 105 (*)    All other components within normal limits   Results for orders placed or performed during the hospital encounter of 04/20/15  CBC with Differential/Platelet  Result Value Ref Range   WBC 7.4 4.0 - 10.5 K/uL   RBC 5.05 3.87 - 5.11 MIL/uL   Hemoglobin 15.3 (H) 12.0 - 15.0 g/dL   HCT 05.3 97.6 - 73.4 %   MCV 88.5 78.0 - 100.0 fL   MCH 30.3 26.0 - 34.0 pg   MCHC 34.2 30.0 - 36.0 g/dL   RDW 19.3 79.0 - 24.0 %   Platelets 288 150 - 400 K/uL   Neutrophils Relative % 58 %   Neutro Abs 4.3 1.7 - 7.7 K/uL   Lymphocytes Relative 29 %   Lymphs Abs 2.1 0.7 - 4.0 K/uL   Monocytes Relative 10 %   Monocytes Absolute  0.7 0.1 - 1.0 K/uL   Eosinophils Relative 2 %   Eosinophils Absolute 0.2 0.0 - 0.7 K/uL   Basophils Relative 1 %   Basophils Absolute 0.0 0.0 - 0.1 K/uL  Basic metabolic panel  Result Value Ref Range   Sodium 140 135 - 145 mmol/L   Potassium 3.7 3.5 - 5.1 mmol/L   Chloride 105 101 - 111 mmol/L   CO2 26 22 - 32 mmol/L   Glucose, Bld 105 (H) 65 - 99 mg/dL   BUN 19 6 - 20 mg/dL   Creatinine, Ser 9.73 0.44 - 1.00 mg/dL   Calcium 9.8 8.9 -  10.3 mg/dL   GFR calc non Af Amer >60 >60 mL/min   GFR calc Af Amer >60 >60 mL/min   Anion gap 9 5 - 15     Imaging Review Dg Chest 2 View  04/20/2015  CLINICAL DATA:  Cough for 1 year.  Generalized weakness. EXAM: CHEST  2 VIEW COMPARISON:  12/20/2013; 06/28/2013; 03/06/2013 FINDINGS: Grossly unchanged borderline enlarged cardiac silhouette and mediastinal contours with mild tortuosity of the thoracic aorta. There is persistent mild elevation / eventration of the medial aspect the right hemidiaphragm. No focal airspace opacities. There is minimal pleural parenchymal thickening about the right minor and the bilateral major fissures. No focal airspace opacities. No pleural effusion or pneumothorax. No evidence of edema. No acute osseus abnormalities. Post cholecystectomy. IMPRESSION: No acute cardiopulmonary disease. Electronically Signed   By: Simonne Come M.D.   On: 04/20/2015 08:16   I have personally reviewed and evaluated these images and lab results as part of my medical decision-making.   EKG Interpretation   Date/Time:  Sunday April 20 2015 07:23:53 EST Ventricular Rate:  115 PR Interval:  141 QRS Duration: 87 QT Interval:  336 QTC Calculation: 465 R Axis:   98 Text Interpretation:  Sinus tachycardia Probable right ventricular  hypertrophy Borderline T abnormalities, inferior leads Confirmed by  Loretta Doutt  MD, Jaquanda Wickersham ([id number]) on 04/20/2015 7:27:27 AM      MDM   Final diagnoses:  Essential hypertension  Oral thrush    Patient  presented with concerns for elevated blood pressure. Did arrive with systolics up around the 180s. However without any treatment it corrected itself however patient had taken her lisinopril prior to coming in.  Patient also with a persistent oral thrush. Patient will be treated with Diflucan because she's already had the short trial of Magic mouth large and nystatin solution. Also will be referred to ear nose and throat for follow-up of this. Patient also with complaint of difficulty swallowing. Ear nose and throat will be of evaluate that.  Patient nontoxic no acute distress. Labs without any significant abnormalities. Normal renal function.  Patient will continue to keep a blood pressure log and follow up with her primary care doctor to see if there needs to be any adjustments in her blood pressure medicines.    Vanetta Mulders, MD 04/20/15 [phone]

## 2015-07-23 ENCOUNTER — Emergency Department (HOSPITAL_COMMUNITY): Payer: Medicare Other

## 2015-07-23 ENCOUNTER — Encounter (HOSPITAL_COMMUNITY): Payer: Self-pay | Admitting: *Deleted

## 2015-07-23 ENCOUNTER — Emergency Department (HOSPITAL_COMMUNITY)
Admission: EM | Admit: 2015-07-23 | Discharge: 2015-07-23 | Disposition: A | Discharge status: Home / Self Care | Payer: Medicare Other | Attending: Emergency Medicine | Admitting: Emergency Medicine

## 2015-07-23 DIAGNOSIS — Z8659 Personal history of other mental and behavioral disorders: Secondary | ICD-10-CM | POA: Diagnosis not present

## 2015-07-23 DIAGNOSIS — K219 Gastro-esophageal reflux disease without esophagitis: Secondary | ICD-10-CM | POA: Insufficient documentation

## 2015-07-23 DIAGNOSIS — R0602 Shortness of breath: Secondary | ICD-10-CM | POA: Insufficient documentation

## 2015-07-23 DIAGNOSIS — Z8739 Personal history of other diseases of the musculoskeletal system and connective tissue: Secondary | ICD-10-CM | POA: Diagnosis not present

## 2015-07-23 DIAGNOSIS — Z87448 Personal history of other diseases of urinary system: Secondary | ICD-10-CM | POA: Diagnosis not present

## 2015-07-23 DIAGNOSIS — I159 Secondary hypertension, unspecified: Secondary | ICD-10-CM | POA: Insufficient documentation

## 2015-07-23 DIAGNOSIS — R002 Palpitations: Secondary | ICD-10-CM | POA: Diagnosis not present

## 2015-07-23 DIAGNOSIS — R079 Chest pain, unspecified: Secondary | ICD-10-CM | POA: Diagnosis present

## 2015-07-23 DIAGNOSIS — R000 Tachycardia, unspecified: Secondary | ICD-10-CM | POA: Diagnosis not present

## 2015-07-23 DIAGNOSIS — Z79899 Other long term (current) drug therapy: Secondary | ICD-10-CM | POA: Insufficient documentation

## 2015-07-23 DIAGNOSIS — Z8669 Personal history of other diseases of the nervous system and sense organs: Secondary | ICD-10-CM | POA: Diagnosis not present

## 2015-07-23 LAB — COMPREHENSIVE METABOLIC PANEL
ALT: 27 U/L (ref 14–54)
AST: 26 U/L (ref 15–41)
Albumin: 4.3 g/dL (ref 3.5–5.0)
Alkaline Phosphatase: 92 U/L (ref 38–126)
Anion gap: 8 (ref 5–15)
BUN: 16 mg/dL (ref 6–20)
CHLORIDE: 107 mmol/L (ref 101–111)
CO2: 27 mmol/L (ref 22–32)
CREATININE: 0.92 mg/dL (ref 0.44–1.00)
Calcium: 9.7 mg/dL (ref 8.9–10.3)
GFR calc Af Amer: >60 mL/min (ref >60)
Glucose, Bld: 100 mg/dL — ABNORMAL HIGH (ref 65–99)
POTASSIUM: 3.8 mmol/L (ref 3.5–5.1)
Sodium: 142 mmol/L (ref 135–145)
Total Bilirubin: 0.5 mg/dL (ref 0.3–1.2)
Total Protein: 7.9 g/dL (ref 6.5–8.1)

## 2015-07-23 LAB — CBC WITH DIFFERENTIAL/PLATELET
Basophils Absolute: 0 10*3/uL (ref 0.0–0.1)
Basophils Relative: 0 %
EOS PCT: 2 %
Eosinophils Absolute: 0.2 10*3/uL (ref 0.0–0.7)
HCT: 43.2 % (ref 36.0–46.0)
Hemoglobin: 14.8 g/dL (ref 12.0–15.0)
LYMPHS ABS: 2.8 10*3/uL (ref 0.7–4.0)
LYMPHS PCT: 26 %
MCH: 30.7 pg (ref 26.0–34.0)
MCHC: 34.3 g/dL (ref 30.0–36.0)
MCV: 89.6 fL (ref 78.0–100.0)
MONO ABS: 1.2 10*3/uL — AB (ref 0.1–1.0)
Monocytes Relative: 11 %
Neutro Abs: 6.4 10*3/uL (ref 1.7–7.7)
Neutrophils Relative %: 61 %
PLATELETS: 287 10*3/uL (ref 150–400)
RBC: 4.82 MIL/uL (ref 3.87–5.11)
RDW: 13.6 % (ref 11.5–15.5)
WBC: 10.6 10*3/uL — ABNORMAL HIGH (ref 4.0–10.5)

## 2015-07-23 LAB — D-DIMER, QUANTITATIVE: D-Dimer, Quant: 0.53 ug/mL-FEU — ABNORMAL HIGH (ref 0.00–0.50)

## 2015-07-23 LAB — I-STAT TROPONIN, ED
TROPONIN I, POC: 0 ng/mL (ref 0.00–0.08)
Troponin i, poc: 0 ng/mL (ref 0.00–0.08)

## 2015-07-23 MED ORDER — ACETAMINOPHEN 325 MG PO TABS
650.0000 mg | ORAL_TABLET | Freq: Once | ORAL | Status: AC
  Administered 2015-07-23: 650 mg via ORAL
  Filled 2015-07-23: qty 2

## 2015-07-23 MED ORDER — LISINOPRIL 10 MG PO TABS: 10.0000 mg | ORAL_TABLET | Freq: Two times a day (BID) | ORAL | Status: DC

## 2015-07-23 MED ORDER — ASPIRIN 325 MG PO TABS
325.0000 mg | ORAL_TABLET | Freq: Once | ORAL | Status: AC
  Administered 2015-07-23: 325 mg via ORAL
  Filled 2015-07-23: qty 1

## 2015-07-23 NOTE — ED Notes (Signed)
Pt placed on bedside commode. Instructed to use call light when finished.

## 2015-07-23 NOTE — ED Notes (Signed)
Pt given breakfast tray

## 2015-07-23 NOTE — ED Notes (Signed)
Pt comes in via EMS for Chest pain starting at 0530 today. Pt is having left arm pain and shortness of breath as well. Pt refused IV by EMS.

## 2015-07-23 NOTE — ED Notes (Signed)
MD at bedside. 

## 2015-07-23 NOTE — ED Provider Notes (Signed)
CSN: [id number]     Arrival date & time 07/23/15  0701 History   First MD Initiated Contact with Patient 07/23/15 [phone]     Chief Complaint  Patient presents with  . Chest Pain     (Consider location/radiation/quality/duration/timing/severity/associated sxs/prior Treatment) Patient is a 65 y.o. female presenting with palpitations.  Palpitations Palpitations quality:  Regular Onset quality:  Gradual Duration:  2 minutes Progression:  Resolved Chronicity:  Recurrent Context: anxiety and dehydration   Context: not illicit drugs, not nicotine and not stimulant use   Relieved by: time. Worsened by:  Nothing Ineffective treatments:  None tried Associated symptoms: shortness of breath   Associated symptoms: no back pain, no chest pain and no cough   Risk factors: no diabetes mellitus, no heart disease, no hx of DVT, no hx of thyroid disease, no hyperthyroidism and no OTC sinus medications     Past Medical History  Diagnosis Date  . Arthritis   . Hypertension   . Fibromyalgia   . Chronic fatigue   . Acid reflux   . Degenerative disc disease   . Irritable bowel syndrome (IBS)   . Interstitial cystitis   . Rheumatic fever   . Depression   . Blepharitis     Recurrent  . Vertigo    Past Surgical History  Procedure Laterality Date  . Knee arthroplasty      twice bilateral  . Carpal tunnel release      bilateral  . Wrist arthroplasty    . Cholecystectomy    . Oophorectomy    . Abdominal hysterectomy     Family History  Problem Relation Age of Onset  . Osteoarthritis Mother   . Heart disease Mother   . Hyperlipidemia Mother   . Cancer Father     colon  . Hyperlipidemia Father   . Heart disease Father   . Rheum arthritis Brother   . Osteoarthritis Brother   . Depression Brother   . Heart disease Brother   . Hyperlipidemia Brother   . Cancer Other   . Depression Sister   . Heart disease Sister   . Hyperlipidemia Sister    Social History  Substance Use Topics  .  Smoking status: Never Smoker   . Smokeless tobacco: Never Used  . Alcohol Use: No   OB History    Gravida Para Term Preterm AB TAB SAB Ectopic Multiple Living   2 2 2       2      Review of Systems  Constitutional: Negative for fever.  HENT: Negative for congestion and facial swelling.   Eyes: Negative for discharge and redness.  Respiratory: Positive for shortness of breath. Negative for cough and wheezing.   Cardiovascular: Positive for palpitations. Negative for chest pain and leg swelling.  Gastrointestinal: Negative for abdominal pain and abdominal distention.  Endocrine: Negative for polydipsia.  Genitourinary: Negative for dysuria.  Musculoskeletal: Negative for back pain.  Skin: Negative for wound.  Neurological: Negative for headaches.  All other systems reviewed and are negative.     Allergies  Aspirin; Codeine; Demerol; and Nsaids  Home Medications   Prior to Admission medications   Medication Sig Start Date End Date Taking? Authorizing Provider  dextromethorphan (ROBITUSSIN CHILDRENS COUGH LA) 7.5 MG/5ML SYRP Take 5 mLs by mouth every 6 (six) hours as needed.   Yes Historical Provider, MD  Multiple Vitamins-Minerals (CENTRUM SILVER ADULT 50+ PO) Take 1 tablet by mouth daily.   Yes Historical Provider, MD  Tetrahydroz-Dextran-PEG-Povid [facility] ADVANCED  RELIEF OP) Apply 2 drops to eye daily.   Yes Historical Provider, MD  acetaminophen (TYLENOL) 500 MG tablet Take 1,000 mg by mouth every 6 (six) hours as needed for mild pain or headache.     Historical Provider, MD  Artificial Tear Ointment (ARTIFICIAL TEARS) ointment Place 1 drop into both eyes as needed. For dry eyes    Historical Provider, MD  lisinopril (PRINIVIL,ZESTRIL) 10 MG tablet Take 1 tablet (10 mg total) by mouth 2 (two) times daily. 07/23/15   Marily Memos, MD  omeprazole (PRILOSEC OTC) 20 MG tablet Take 20 mg by mouth daily.    Historical Provider, MD   BP 160/87 mmHg  Pulse 93  Temp(Src) 98.5 F (36.9  C) (Oral)  Resp 18  Ht 5\' 4"  (1.626 m)  Wt 155 lb (70.308 kg)  BMI 26.59 kg/m2  SpO2 96% Physical Exam  Constitutional: She is oriented to person, place, and time. She appears well-developed and well-nourished.  HENT:  Head: Normocephalic and atraumatic.  Eyes: Pupils are equal, round, and reactive to light.  Neck: Normal range of motion.  Cardiovascular: Regular rhythm.  Tachycardia present.   Pulmonary/Chest: Effort normal. No stridor. No respiratory distress.  Abdominal: Soft. She exhibits no distension. There is no tenderness. There is no rebound.  Musculoskeletal: Normal range of motion. She exhibits no edema or tenderness.  Neurological: She is alert and oriented to person, place, and time.  Skin: Skin is warm and dry.  Nursing note and vitals reviewed.   ED Course  Procedures (including critical care time) Labs Review Labs Reviewed  CBC WITH DIFFERENTIAL/PLATELET - Abnormal; Notable for the following:    WBC 10.6 (*)    Monocytes Absolute 1.2 (*)    All other components within normal limits  COMPREHENSIVE METABOLIC PANEL - Abnormal; Notable for the following:    Glucose, Bld 100 (*)    All other components within normal limits  D-DIMER, QUANTITATIVE (NOT AT [facility]) - Abnormal; Notable for the following:    D-Dimer, Quant 0.53 (*)    All other components within normal limits  I-STAT TROPOININ, ED  [facility], ED    Imaging Review Dg Chest 2 View  07/23/2015  CLINICAL DATA:  One day history of chest pain extending into left upper extremity. Shortness of breath. EXAM: CHEST  2 VIEW COMPARISON:  April 20, 2015 FINDINGS: Lungs are clear. Heart size and pulmonary vascularity are normal. No adenopathy. No bone lesions. There are surgical clips in the right upper quadrant abdomen region. IMPRESSION: No edema or consolidation. Electronically Signed   By: April 22, 2015 III M.D.   On: 07/23/2015 08:15   I have personally reviewed and evaluated these images and lab  results as part of my medical decision-making.   EKG Interpretation   Date/Time:  Wednesday July 23 2015 07:02:06 EST Ventricular Rate:  113 PR Interval:  145 QRS Duration: 89 QT Interval:  322 QTC Calculation: 441 R Axis:   73 Text Interpretation:  Sinus tachycardia Borderline repolarization  abnormality No significant change since last tracing  04/20/15 Confirmed by  [facility] MD, [facility] [phone]) on 07/23/2015 7:10:51 AM      MDM   Final diagnoses:  Secondary hypertension, unspecified  Heart palpitations   Palpitations when she realized her BP was high this morning. Improved now. Low HEART score, will delta trop. Low well's, will D Dimer. Doubt aortic pathology with palpitations being the main complaint and not pain or stereotypical radiation of pain. Has had stress test but  not recently. Has not seen pcp recently.  No recurrent symptoms here. Labs ok. Doubt acs. Does get anxious easily and HR will go to 115, likely related to her symptoms. D dimer appropriate for age adjusted cutoffs. Will FU PCP within next week for GI/cardiology recommendations and also for med refills.     Marily Memos, MD 07/24/15 [phone]

## 2016-05-20 ENCOUNTER — Encounter (HOSPITAL_COMMUNITY): Payer: Self-pay

## 2016-05-20 ENCOUNTER — Emergency Department (HOSPITAL_COMMUNITY)
Admission: EM | Admit: 2016-05-20 | Discharge: 2016-05-20 | Disposition: A | Discharge status: Home / Self Care | Payer: Medicare Other | Attending: Emergency Medicine | Admitting: Emergency Medicine

## 2016-05-20 DIAGNOSIS — R05 Cough: Secondary | ICD-10-CM | POA: Diagnosis not present

## 2016-05-20 DIAGNOSIS — R000 Tachycardia, unspecified: Secondary | ICD-10-CM | POA: Insufficient documentation

## 2016-05-20 DIAGNOSIS — R002 Palpitations: Secondary | ICD-10-CM | POA: Diagnosis not present

## 2016-05-20 DIAGNOSIS — Z79899 Other long term (current) drug therapy: Secondary | ICD-10-CM | POA: Diagnosis not present

## 2016-05-20 DIAGNOSIS — R21 Rash and other nonspecific skin eruption: Secondary | ICD-10-CM

## 2016-05-20 DIAGNOSIS — H9201 Otalgia, right ear: Secondary | ICD-10-CM | POA: Insufficient documentation

## 2016-05-20 DIAGNOSIS — I10 Essential (primary) hypertension: Secondary | ICD-10-CM | POA: Insufficient documentation

## 2016-05-20 MED ORDER — HYDROCORTISONE 1 % EX LOTN: 1.0000 "application " | TOPICAL_LOTION | Freq: Two times a day (BID) | CUTANEOUS | 0 refills | Status: DC

## 2016-05-20 NOTE — ED Provider Notes (Signed)
[facility]-EMER[facility]Y DEPT Provider Note   CSN: [id number] Arrival date & time: 05/20/16  0017  By signing my name below, I, Placido Sou, attest that this documentation has been prepared under the direction and in the presence of Raeford Razor, MD. Electronically Signed: Placido Sou, ED Scribe. 05/20/16. 8:55 AM.   History   Chief Complaint Chief Complaint  Patient presents with  . Rash    HPI HPI Comments: Pamela Vasquez is a 65 y.o. female who presents to the Emergency Department complaining of worsening, moderate, rash to her neck, chest and arms x 2 days. Pt states the rash is red and burning and first noticed it around her sternal region. Pt states that she began using a new brand of body lotion but states that she has used it across her whole body without issues in other regions. She states that "her upper chest is very sensitive". Pt also has a diffuse rash to her bilateral legs but states that she used a new razor at the time and was not using a shaving cream and experienced razor burn across the regions. Pt denies any known contacts with a similar rash.   Pt also complains of moderate pain inferior to her right ear and intermittent palpitations which have been ongoing for some time. 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 She states her last episode of palpitations were this morning and denies CP at the time. She takes lisinopril daily. She also reports a chronic cough. Pt states she experiences intermittent dysphagia and will "regularly choke on food and water".   The history is provided by the patient and medical records. No language interpreter was used.    Past Medical History:  Diagnosis Date  . Acid reflux   . Arthritis   . Blepharitis    Recurrent  . Chronic fatigue   . Degenerative disc disease   . Depression   . Fibromyalgia   . Hypertension   . Interstitial cystitis   . Irritable bowel syndrome (IBS)   .  Rheumatic fever   . Vertigo     Patient Active Problem List   Diagnosis Date Noted  . Chest pain 03/06/2013  . Boil of buttock 02/11/2012  . Heart palpitations 12/08/2011  . Oral candidiasis 12/08/2011  . Vaginitis 12/08/2011  . Hyperlipidemia 12/08/2011  . Chronic cough 07/07/2011  . Chronic fatigue 02/08/2011  . Blepharitis 02/08/2011  . Overweight(278.02) 02/05/2011  . WEAKNESS 01/24/2007  . ANXIETY 08/22/2006  . DEPRESSION 08/22/2006  . MIGRAINE HEADACHE 08/22/2006  . PERIPHERAL NEUROPATHY 08/22/2006  . HYPERTENSION 08/22/2006  . GERD 08/22/2006  . CONSTIPATION NOS 08/22/2006  . IBS 08/22/2006  . OVERACTIVE BLADDER 08/22/2006  . OSTEOARTHRITIS 08/22/2006  . LOW BACK PAIN 08/22/2006  . FIBROMYALGIA 08/22/2006  . COLONIC POLYPS, HX OF 08/22/2006    Past Surgical History:  Procedure Laterality Date  . ABDOMINAL HYSTERECTOMY    . CARPAL TUNNEL RELEASE     bilateral  . CHOLECYSTECTOMY    . KNEE ARTHROPLASTY     twice bilateral  . OOPHORECTOMY    . WRIST ARTHROPLASTY      OB History    Gravida Para Term Preterm AB Living   2 2 2     2    SAB TAB Ectopic Multiple Live Births                   Home Medications    Prior to Admission  medications   Medication Sig Start Date End Date Taking? Authorizing Provider  acetaminophen (TYLENOL) 500 MG tablet Take 1,000 mg by mouth every 6 (six) hours as needed for mild pain or headache.     Historical Provider, MD  Artificial Tear Ointment (ARTIFICIAL TEARS) ointment Place 1 drop into both eyes as needed. For dry eyes    Historical Provider, MD  dextromethorphan (ROBITUSSIN CHILDRENS COUGH LA) 7.5 MG/5ML SYRP Take 5 mLs by mouth every 6 (six) hours as needed.    Historical Provider, MD  lisinopril (PRINIVIL,ZESTRIL) 10 MG tablet Take 1 tablet (10 mg total) by mouth 2 (two) times daily. 07/23/15   Marily Memos, MD  Multiple Vitamins-Minerals (CENTRUM SILVER ADULT 50+ PO) Take 1 tablet by mouth daily.    Historical Provider,  MD  omeprazole (PRILOSEC OTC) 20 MG tablet Take 20 mg by mouth daily.    Historical Provider, MD  Tetrahydroz-Dextran-PEG-Povid Lurlean Horns ADVANCED RELIEF OP) Apply 2 drops to eye daily.    Historical Provider, MD    Family History Family History  Problem Relation Age of Onset  . Osteoarthritis Mother   . Heart disease Mother   . Hyperlipidemia Mother   . Cancer Father     colon  . Hyperlipidemia Father   . Heart disease Father   . Rheum arthritis Brother   . Osteoarthritis Brother   . Depression Brother   . Heart disease Brother   . Hyperlipidemia Brother   . Cancer Other   . Depression Sister   . Heart disease Sister   . Hyperlipidemia Sister     Social History Social History  Substance Use Topics  . Smoking status: Never Smoker  . Smokeless tobacco: Never Used  . Alcohol use No     Allergies   Aspirin; Codeine; Demerol; and Nsaids   Review of Systems Review of Systems  HENT: Positive for ear pain.   Respiratory: Positive for cough.   Cardiovascular: Positive for palpitations.  Skin: Positive for color change and rash.  All other systems reviewed and are negative.  Physical Exam Updated Vital Signs Ht 5\' 4"  (1.626 m)   Wt 150 lb (68 kg)   BMI 25.75 kg/m   Physical Exam  Constitutional: She is oriented to person, place, and time. She appears well-developed and well-nourished. No distress.  HENT:  Head: Normocephalic and atraumatic.  Eyes: EOM are normal.  Neck: Normal range of motion.  Cardiovascular: Regular rhythm and normal heart sounds.  Tachycardia present.   Mild tachycardia   Pulmonary/Chest: Effort normal and breath sounds normal.  Abdominal: Soft. She exhibits no distension. There is no tenderness.  Musculoskeletal: Normal range of motion.  Neurological: She is alert and oriented to person, place, and time.  Skin: Skin is warm and dry. Rash noted. There is erythema.  Macular papular erythematous rash to upper chest and bilateral antecubital  fossa.   Psychiatric: She has a normal mood and affect. Judgment normal.  Nursing note and vitals reviewed.  ED Treatments / Results  Labs (all labs ordered are listed, but only abnormal results are displayed) Labs Reviewed - No data to display  EKG  EKG Interpretation  Date/Time:  Thursday May 20 2016 09:10:01 EST Ventricular Rate:  100 PR Interval:    QRS Duration: 85 QT Interval:  350 QTC Calculation: 452 R Axis:   59 Text Interpretation:  Sinus tachycardia Confirmed by 03-01-2001  MD, Wen Munford Juleen China) on 05/20/2016 9:14:49 AM       Radiology No results found.  Procedures Procedures  COORDINATION OF CARE: 8:55 AM Discussed next steps with pt. Pt verbalized understanding and is agreeable with the plan.    Medications Ordered in ED Medications - No data to display   Initial Impression / Assessment and Plan / ED Course  I have reviewed the triage vital signs and the nursing notes.  Pertinent labs & imaging results that were available during my care of the patient were reviewed by me and considered in my medical decision making (see chart for details).  Clinical Course     65yF with multiple complaints. Rash is nonspecific. No signs of anaphylaxis otherwise. Palpitations. Sinus tach on EKG otherwise ok. Doubt PE, significant anemia, infection, etc.   Final Clinical Impressions(s) / ED Diagnoses   Final diagnoses:  Rash  Heart palpitations    New Prescriptions New Prescriptions   No medications on file     Raeford Razor, MD 05/23/16 [phone]

## 2016-05-20 NOTE — ED Triage Notes (Signed)
Complain of rash around neck that started two days ago. States it feels like a blow torch on her neck

## 2016-05-20 NOTE — ED Notes (Signed)
Pt says she notice rash about 2 days ago to upper chest and then in the bends of her arms.  C/o burning rating discomfort 9/10.  Pt says she has only used one new product Olay super moisturizing lotion.  Used the lotion last night on the chest and neck area.

## 2016-12-05 ENCOUNTER — Emergency Department (HOSPITAL_COMMUNITY): Payer: Medicare Other

## 2016-12-05 ENCOUNTER — Encounter (HOSPITAL_COMMUNITY): Payer: Self-pay | Admitting: *Deleted

## 2016-12-05 ENCOUNTER — Emergency Department (HOSPITAL_COMMUNITY)
Admission: EM | Admit: 2016-12-05 | Discharge: 2016-12-06 | Disposition: A | Discharge status: Home / Self Care | Payer: Medicare Other | Attending: Emergency Medicine | Admitting: Emergency Medicine

## 2016-12-05 DIAGNOSIS — I10 Essential (primary) hypertension: Secondary | ICD-10-CM | POA: Insufficient documentation

## 2016-12-05 DIAGNOSIS — R109 Unspecified abdominal pain: Secondary | ICD-10-CM

## 2016-12-05 DIAGNOSIS — R1084 Generalized abdominal pain: Secondary | ICD-10-CM | POA: Diagnosis not present

## 2016-12-05 LAB — COMPREHENSIVE METABOLIC PANEL
ALK PHOS: 96 U/L (ref 38–126)
ALT: 25 U/L (ref 14–54)
AST: 26 U/L (ref 15–41)
Albumin: 3.8 g/dL (ref 3.5–5.0)
Anion gap: 7 (ref 5–15)
BUN: 14 mg/dL (ref 6–20)
CALCIUM: 9.5 mg/dL (ref 8.9–10.3)
CHLORIDE: 112 mmol/L — AB (ref 101–111)
CO2: 27 mmol/L (ref 22–32)
CREATININE: 0.87 mg/dL (ref 0.44–1.00)
Glucose, Bld: 116 mg/dL — ABNORMAL HIGH (ref 65–99)
Potassium: 3.5 mmol/L (ref 3.5–5.1)
Sodium: 146 mmol/L — ABNORMAL HIGH (ref 135–145)
Total Bilirubin: 0.3 mg/dL (ref 0.3–1.2)
Total Protein: 7.5 g/dL (ref 6.5–8.1)

## 2016-12-05 LAB — TROPONIN I: Troponin I: <0.03 ng/mL (ref <0.03)

## 2016-12-05 LAB — CBC WITH DIFFERENTIAL/PLATELET
BASOS ABS: 0 10*3/uL (ref 0.0–0.1)
Basophils Relative: 0 %
EOS PCT: 1 %
Eosinophils Absolute: 0.1 10*3/uL (ref 0.0–0.7)
HCT: 38.9 % (ref 36.0–46.0)
Hemoglobin: 13.5 g/dL (ref 12.0–15.0)
LYMPHS ABS: 1.9 10*3/uL (ref 0.7–4.0)
LYMPHS PCT: 15 %
MCH: 30.1 pg (ref 26.0–34.0)
MCHC: 34.7 g/dL (ref 30.0–36.0)
MCV: 86.6 fL (ref 78.0–100.0)
MONO ABS: 1.5 10*3/uL — AB (ref 0.1–1.0)
Monocytes Relative: 12 %
Neutro Abs: 9.1 10*3/uL — ABNORMAL HIGH (ref 1.7–7.7)
Neutrophils Relative %: 72 %
PLATELETS: 286 10*3/uL (ref 150–400)
RBC: 4.49 MIL/uL (ref 3.87–5.11)
RDW: 14.1 % (ref 11.5–15.5)
WBC: 12.5 10*3/uL — AB (ref 4.0–10.5)

## 2016-12-05 LAB — URINALYSIS, ROUTINE W REFLEX MICROSCOPIC
Bacteria, UA: NONE SEEN
Bilirubin Urine: NEGATIVE
GLUCOSE, UA: NEGATIVE mg/dL
Ketones, ur: NEGATIVE mg/dL
Leukocytes, UA: NEGATIVE
Nitrite: NEGATIVE
PH: 7 (ref 5.0–8.0)
Protein, ur: NEGATIVE mg/dL
Specific Gravity, Urine: 1.004 — ABNORMAL LOW (ref 1.005–1.030)

## 2016-12-05 LAB — LIPASE, BLOOD: LIPASE: 36 U/L (ref 11–51)

## 2016-12-05 MED ORDER — AMLODIPINE BESYLATE 5 MG PO TABS
5.0000 mg | ORAL_TABLET | Freq: Once | ORAL | Status: AC
  Administered 2016-12-05: 5 mg via ORAL
  Filled 2016-12-05: qty 1

## 2016-12-05 MED ORDER — SODIUM CHLORIDE 0.9 % IV BOLUS (SEPSIS)
1000.0000 mL | Freq: Once | INTRAVENOUS | Status: AC
  Administered 2016-12-05: 1000 mL via INTRAVENOUS

## 2016-12-05 MED ORDER — LISINOPRIL 10 MG PO TABS: 10.0000 mg | ORAL_TABLET | Freq: Two times a day (BID) | ORAL | 0 refills | Status: DC

## 2016-12-05 MED ORDER — ONDANSETRON HCL 4 MG/2ML IJ SOLN
4.0000 mg | Freq: Once | INTRAMUSCULAR | Status: DC
  Filled 2016-12-05: qty 2

## 2016-12-05 MED ORDER — SODIUM CHLORIDE 0.9 % IV SOLN: INTRAVENOUS | Status: DC

## 2016-12-05 NOTE — Discharge Instructions (Signed)
Take your blood pressure medications as prescribed, follow up with a primary care doctor to continue to monitor your blood pressure

## 2016-12-05 NOTE — ED Triage Notes (Signed)
Pt brought in by [facility] for c/o abdominal pain and high blood pressure; pt states the pain started today, pt denies any vomiting; pt's BP with ems was 203/130 manual, ems administered 324mg  baby aspirin and one nitroglycerin tablet with her BP decreasing to 177/77; pt states she had a BM today and is c/o rectal pain; pt states she has been without her medications x 5-6 weeks due to trying to find a new doctor

## 2016-12-05 NOTE — ED Notes (Signed)
Pt ambulatory to bathroom with standby assist. Pt has slow and steady gait.

## 2016-12-05 NOTE — ED Notes (Signed)
Patient transported to X-ray 

## 2016-12-05 NOTE — ED Provider Notes (Addendum)
[facility] DE[facility] Provider Note   CSN: [id number] Arrival date & time: 12/05/16  1920     History   Chief Complaint Chief Complaint  Patient presents with  . Hypertension    HPI Pamela Vasquez is a 66 y.o. female.  HPI Patient presents to the emergency room for evaluation of abdominal pain and hypertension. Patient states she has a history of irritable bowel syndrome, fibromyalgia, chronic fatigue syndrome, and interstitial cystitis.  She has not seen her primary doctor in a long period of time because it's difficult for her to go to the doctor's office. She has intermittent episodes of abdominal pain associated with her irritable bowel syndrome. Today she had a few loose stools. This evening she started having more severe diffuse abdominal pain. She pressed her life assist button. EMS arrived. They found that the patient hypertensive. She was given aspirin and nitroglycerin. Patient denies any chest pain. She denies any shortness of breath. She denies any vomiting or dysuria.  Past Medical History:  Diagnosis Date  . Acid reflux   . Arthritis   . Blepharitis    Recurrent  . Chronic fatigue   . Degenerative disc disease   . Depression   . Fibromyalgia   . Hypertension   . Interstitial cystitis   . Irritable bowel syndrome (IBS)   . Rheumatic fever   . Vertigo      Past Surgical History:  Procedure Laterality Date  . ABDOMINAL HYSTERECTOMY    . CARPAL TUNNEL RELEASE     bilateral  . CHOLECYSTECTOMY    . KNEE ARTHROPLASTY     twice bilateral  . OOPHORECTOMY    . WRIST ARTHROPLASTY      OB History    Gravida Para Term Preterm AB Living   2 2 2     2    SAB TAB Ectopic Multiple Live Births                   Home Medications    Prior to Admission medications   Medication Sig Start Date End Date Taking? Authorizing Provider  acetaminophen (TYLENOL) 500 MG tablet Take 1,000 mg by mouth every 6 (six) hours as needed for mild pain or headache.     [provider]  Artificial Tear Ointment (ARTIFICIAL TEARS) ointment Place 1 drop into both eyes as needed. For dry eyes    [provider]  dextromethorphan (ROBITUSSIN CHILDRENS COUGH LA) 7.5 MG/5ML SYRP Take 5 mLs by mouth every 6 (six) hours as needed.    [provider]  hydrocortisone 1 % lotion Apply 1 application topically 2 (two) times daily. 05/20/16   14/7/17, MD  lisinopril (PRINIVIL,ZESTRIL) 10 MG tablet Take 1 tablet (10 mg total) by mouth 2 (two) times daily. 12/05/16   12/07/16, MD  Multiple Vitamins-Minerals (CENTRUM SILVER ADULT 50+ PO) Take 1 tablet by mouth daily.    [provider]  omeprazole (PRILOSEC OTC) 20 MG tablet Take 20 mg by mouth daily.    [provider]  Tetrahydroz-Dextran-PEG-Povid Linwood Dibbles ADVANCED RELIEF OP) Apply 2 drops to eye daily.    [provider]    Family History Family History  Problem Relation Age of Onset  . Osteoarthritis Mother   . Heart disease Mother   . Hyperlipidemia Mother   . Cancer Father        colon  . Hyperlipidemia Father   . Heart disease Father   . Rheum arthritis Brother   . Osteoarthritis  Brother   . Depression Brother   . Heart disease Brother   . Hyperlipidemia Brother   . Cancer Other   . Depression Sister   . Heart disease Sister   . Hyperlipidemia Sister     Social History Social History  Substance Use Topics  . Smoking status: Never Smoker  . Smokeless tobacco: Never Used  . Alcohol use No     Allergies   Aspirin; Codeine; Demerol; and Nsaids   Review of Systems Review of Systems  All other systems reviewed and are negative.    Physical Exam Updated Vital Signs BP (!) 173/95 (BP Location: Right Arm)   Pulse (!) 121   Temp 98.3 F (36.8 C) (Oral)   Resp 20   SpO2 93%   Physical Exam  Constitutional: She appears well-developed and well-nourished. No distress.  HENT:  Head: Normocephalic and atraumatic.  Right Ear: External ear normal.   Left Ear: External ear normal.  Eyes: Conjunctivae are normal. Right eye exhibits no discharge. Left eye exhibits no discharge. No scleral icterus.  Neck: Neck supple. No tracheal deviation present.  Cardiovascular: Normal rate, regular rhythm and intact distal pulses.   Pulmonary/Chest: Effort normal and breath sounds normal. No stridor. No respiratory distress. She has no wheezes. She has no rales.  Abdominal: Soft. Bowel sounds are normal. She exhibits no distension. There is no tenderness. There is no rebound and no guarding.  Musculoskeletal: She exhibits no edema or tenderness.  Neurological: She is alert. She has normal strength. No cranial nerve deficit (no facial droop, extraocular movements intact, no slurred speech) or sensory deficit. She exhibits normal muscle tone. She displays no seizure activity. Coordination normal.  Skin: Skin is warm and dry. No rash noted.  Psychiatric: She has a normal mood and affect.  Nursing note and vitals reviewed.    ED Treatments / Results  Labs (all labs ordered are listed, but only abnormal results are displayed) Labs Reviewed  COMPREHENSIVE METABOLIC PANEL - Abnormal; Notable for the following:       Result Value   Sodium 146 (*)    Chloride 112 (*)    Glucose, Bld 116 (*)    All other components within normal limits  CBC WITH DIFFERENTIAL/PLATELET - Abnormal; Notable for the following:    WBC 12.5 (*)    Neutro Abs 9.1 (*)    Monocytes Absolute 1.5 (*)    All other components within normal limits  URINALYSIS, ROUTINE W REFLEX MICROSCOPIC - Abnormal; Notable for the following:    Color, Urine STRAW (*)    Specific Gravity, Urine 1.004 (*)    Hgb urine dipstick SMALL (*)    Squamous Epithelial / LPF 0-5 (*)    All other components within normal limits  LIPASE, BLOOD  TROPONIN I    EKG  EKG Interpretation  Date/Time:  Sunday December 05 2016 20:12:49 EDT Ventricular Rate:  102 PR Interval:    QRS Duration: 85 QT  Interval:  352 QTC Calculation: 459 R Axis:   69 Text Interpretation:  Sinus tachycardia Low voltage, precordial leads No significant change since last tracing Confirmed by Linwood Dibbles [phone]) on 12/05/2016 9:15:14 PM       Radiology Dg Abd Acute W/chest  Result Date: 12/05/2016 CLINICAL DATA:  Abdominal pain and high blood pressure EXAM: DG ABDOMEN ACUTE W/ 1V CHEST COMPARISON:  07/23/2015 FINDINGS: Single-view chest demonstrates no acute consolidation or effusion. Normal heart size. Supine and upright views of the abdomen demonstrate no free  air beneath the diaphragm. There are surgical clips in the right upper quadrant. Nonobstructed bowel gas pattern with moderate stool in the colon. IMPRESSION: 1. No acute infiltrate or edema 2. Nonobstructed bowel-gas pattern Electronically Signed   By: Jasmine Pang M.D.   On: 12/05/2016 21:53    Procedures Procedures (including critical care time)  Medications Ordered in ED Medications  sodium chloride 0.9 % bolus 1,000 mL (1,000 mLs Intravenous New Bag/Given 12/05/16 2004)    And  0.9 %  sodium chloride infusion (not administered)  ondansetron (ZOFRAN) injection 4 mg (4 mg Intravenous Refused 12/05/16 2002)  amLODipine (NORVASC) tablet 5 mg (5 mg Oral Given 12/05/16 2004)     Initial Impression / Assessment and Plan / ED Course  I have reviewed the triage vital signs and the nursing notes.  Pertinent labs & imaging results that were available during my care of the patient were reviewed by me and considered in my medical decision making (see chart for details).  Clinical Course as of Dec 05 2209  Sun Dec 05, 2016  2211 HR is 90 at the bedside  [JK]    Clinical Course User Index [JK] Linwood Dibbles, MD  Pt presented to the ED with complaints of abdominal pain.  She has a history of IBS and recurrent abdominal pain.  Initial labs are reassuring.  Abd exam is benign.  Doubt colitis, bowel obstruction.  Pt also presented with HTN.  She has not been  taking any of her medications and has not seen a PCP in a while.  Stressed importance of that.  Will refill her bp med.  Final Clinical Impressions(s) / ED Diagnoses   Final diagnoses:  Hypertension, unspecified type  Abdominal pain, unspecified abdominal location    New Prescriptions Current Discharge Medication List    zestril   Linwood Dibbles, MD 12/05/16 2200    Linwood Dibbles, MD 12/05/16 2211

## 2017-11-20 ENCOUNTER — Other Ambulatory Visit: Payer: Self-pay

## 2017-11-20 ENCOUNTER — Emergency Department (HOSPITAL_COMMUNITY)
Admission: EM | Admit: 2017-11-20 | Discharge: 2017-11-20 | Disposition: A | Discharge status: Home / Self Care | Payer: Medicare Other | Attending: Emergency Medicine | Admitting: Emergency Medicine

## 2017-11-20 ENCOUNTER — Encounter (HOSPITAL_COMMUNITY): Payer: Self-pay | Admitting: *Deleted

## 2017-11-20 DIAGNOSIS — K047 Periapical abscess without sinus: Secondary | ICD-10-CM | POA: Diagnosis not present

## 2017-11-20 DIAGNOSIS — Z96653 Presence of artificial knee joint, bilateral: Secondary | ICD-10-CM | POA: Diagnosis not present

## 2017-11-20 DIAGNOSIS — I10 Essential (primary) hypertension: Secondary | ICD-10-CM | POA: Insufficient documentation

## 2017-11-20 DIAGNOSIS — Z79899 Other long term (current) drug therapy: Secondary | ICD-10-CM | POA: Diagnosis not present

## 2017-11-20 DIAGNOSIS — K0889 Other specified disorders of teeth and supporting structures: Secondary | ICD-10-CM | POA: Diagnosis present

## 2017-11-20 MED ORDER — LISINOPRIL 10 MG PO TABS: 10.0000 mg | ORAL_TABLET | Freq: Every day | ORAL | 3 refills | Status: DC

## 2017-11-20 MED ORDER — PENICILLIN V POTASSIUM 500 MG PO TABS: 500.0000 mg | ORAL_TABLET | Freq: Four times a day (QID) | ORAL | 0 refills | Status: DC

## 2017-11-20 NOTE — ED Triage Notes (Addendum)
Pt c/o right lower toothache and jaw swelling x 2 weeks. Pt reports she has been out of her BP medication x 2 days because she hasn't been able to get back to her doctor's office due to chronic fatigue syndrome. Pt does report headache but also reports they are chronic for her, no change in headaches since she ran out of her BP medication. Pt's BP 189/88 in triage.

## 2017-11-20 NOTE — ED Notes (Signed)
EDP at bedside  

## 2017-11-20 NOTE — Discharge Instructions (Signed)
Please see the attached follow-up instructions for local family doctors as well as dentists.  Penicillin 4 times a day for 10 days  Lisinopril daily  Seek medical attention for severe or worsening swelling fever or difficulty breathing or swallowing or any swelling of the face  [facility] Primary [facility] Doctor List    Kari Baars MD. Specialty: Pulmonary Disease Contact information: [address]  [address]  [place] [address]  [phone]   Syliva Overman, MD. Specialty: [facility] Contact information: [address]  [place] [address]  [phone]   Lilyan Punt, MD. Specialty: Family Medicine Contact information: [address]  [place] [address]  [phone]   Avon Gully, MD Specialty: Internal Medicine Contact information: [address] [place] [address]  [phone]   Catalina Pizza, MD. Specialty: Internal Medicine Contact information: [address]  [place] [address]  [phone]    [facility] (Dr. Selena Batten) Specialty: Family Medicine Contact information: [address]  [place] [address]  [phone]   John Giovanni, MD. Specialty: [facility] Contact information: [address]  [address]  [place] [address]  [phone]   Carylon Perches, MD. Specialty: Internal Medicine Contact information: [address]  [address]  [place] [address]  [phone]    [facility]  [address] [place], [place] [address] [phone]  Services [facility] offers a variety of basic health services.  Services include but are not limited to: Blood pressure checks  Heart rate checks  Blood sugar checks  Urine analysis  Rapid strep tests  Pregnancy tests.  Health education and referrals  People needing more complex services will be directed to a physician online. Using these virtual visits,  doctors can evaluate and prescribe medicine and treatments. There will be no medication on-site, though [facility] will help patients fill their prescriptions at little to no cost.   For More information please go to: [email or web address]

## 2017-11-20 NOTE — ED Provider Notes (Signed)
[facility] EMERGENCY DEPARTMENT Provider Note   CSN: [id number] Arrival date & time: 11/20/17  1400     History   Chief Complaint Chief Complaint  Patient presents with  . Dental Pain    HPI Pamela Vasquez is a 67 y.o. female.  HPI  The pt presents with 2 complaints:  1.  Dental pain - has chronic tooth erosion / cavitie and fractures -she reports that over the last month she has had progressive pain and swelling to her jaw, she denies fevers but has pain with chewing.  She has not seen a dentist, states that she does not have dental insurance, she does have Medicare.  She has had no medicine prior to arrival for her constant gradually worsening pain - she has been taking ibuprofen and tylenol for pain with only minimal improvement.  2.  BP - she has had elevated BP and has been out of her lisinopril - states that she no longer has a PCP in this area - she has run out of meds - for at least 2 days - she has chronic fatigue that she states keeps her at home and in a motorized scooter preventing her from having enough energy to leave the house or go to the doctor or do much of anything at all.   3.  She also requests ativan to help her sleep at night stating she has used this in the past and never sleeps well but has not had this medicine in several years.  Past Medical History:  Diagnosis Date  . Acid reflux   . Arthritis   . Blepharitis    Recurrent  . Chronic fatigue   . Degenerative disc disease   . Depression   . Fibromyalgia   . Hypertension   . Interstitial cystitis   . Irritable bowel syndrome (IBS)   . Rheumatic fever   . Vertigo     Patient Active Problem List   Diagnosis Date Noted  . Chest pain 03/06/2013  . Boil of buttock 02/11/2012  . Heart palpitations 12/08/2011  . Oral candidiasis 12/08/2011  . Vaginitis 12/08/2011  . Hyperlipidemia 12/08/2011  . Chronic cough 07/07/2011  . Chronic fatigue 02/08/2011  . Blepharitis 02/08/2011  .  Overweight(278.02) 02/05/2011  . WEAKNESS 01/24/2007  . ANXIETY 08/22/2006  . DEPRESSION 08/22/2006  . MIGRAINE HEADACHE 08/22/2006  . PERIPHERAL NEUROPATHY 08/22/2006  . HYPERTENSION 08/22/2006  . GERD 08/22/2006  . CONSTIPATION NOS 08/22/2006  . IBS 08/22/2006  . OVERACTIVE BLADDER 08/22/2006  . OSTEOARTHRITIS 08/22/2006  . LOW BACK PAIN 08/22/2006  . FIBROMYALGIA 08/22/2006  . COLONIC POLYPS, HX OF 08/22/2006    Past Surgical History:  Procedure Laterality Date  . ABDOMINAL HYSTERECTOMY    . CARPAL TUNNEL RELEASE     bilateral  . CHOLECYSTECTOMY    . KNEE ARTHROPLASTY     twice bilateral  . OOPHORECTOMY    . WRIST ARTHROPLASTY       OB History    Gravida  2   Para  2   Term  2   Preterm      AB      Living  2     SAB      TAB      Ectopic      Multiple      Live Births               Home Medications    Prior to Admission medications   Medication Sig  Start Date End Date Taking? Authorizing Provider  acetaminophen (TYLENOL) 500 MG tablet Take 1,000 mg by mouth every 6 (six) hours as needed for mild pain or headache.     [provider]  Artificial Tear Ointment (ARTIFICIAL TEARS) ointment Place 1 drop into both eyes as needed. For dry eyes    [provider]  dextromethorphan (ROBITUSSIN CHILDRENS COUGH LA) 7.5 MG/5ML SYRP Take 5 mLs by mouth every 6 (six) hours as needed.    [provider]  hydrocortisone 1 % lotion Apply 1 application topically 2 (two) times daily. 05/20/16   Raeford Razor, MD  lisinopril (PRINIVIL,ZESTRIL) 10 MG tablet Take 1 tablet (10 mg total) by mouth daily. 11/20/17   Eber Hong, MD  Multiple Vitamins-Minerals (CENTRUM SILVER ADULT 50+ PO) Take 1 tablet by mouth daily.    [provider]  omeprazole (PRILOSEC OTC) 20 MG tablet Take 20 mg by mouth daily.    [provider]  penicillin v potassium (VEETID) 500 MG tablet Take 1 tablet (500 mg total) by mouth 4 (four) times  daily. 11/20/17   Eber Hong, MD  Tetrahydroz-Dextran-PEG-Povid Lurlean Horns ADVANCED RELIEF OP) Apply 2 drops to eye daily.    [provider]    Family History Family History  Problem Relation Age of Onset  . Osteoarthritis Mother   . Heart disease Mother   . Hyperlipidemia Mother   . Cancer Father        colon  . Hyperlipidemia Father   . Heart disease Father   . Rheum arthritis Brother   . Osteoarthritis Brother   . Depression Brother   . Heart disease Brother   . Hyperlipidemia Brother   . Cancer Other   . Depression Sister   . Heart disease Sister   . Hyperlipidemia Sister     Social History Social History   Tobacco Use  . Smoking status: Never Smoker  . Smokeless tobacco: Never Used  Substance Use Topics  . Alcohol use: No  . Drug use: No     Allergies   Aspirin; Codeine; Demerol; and Nsaids   Review of Systems Review of Systems  Constitutional: Negative for fever.  HENT: Positive for dental problem and facial swelling. Negative for ear pain.   Respiratory: Negative for cough and shortness of breath.   Cardiovascular: Negative for chest pain.  Gastrointestinal: Negative for nausea and vomiting.  Musculoskeletal: Negative for neck pain.  Neurological: Negative for headaches.     Physical Exam Updated Vital Signs BP (!) 189/88 (BP Location: Right Arm)   Pulse 98   Temp 98.2 F (36.8 C) (Oral)   Resp 18   Ht 5\' 4"  (1.626 m)   Wt 68 kg (150 lb)   SpO2 96%   BMI 25.75 kg/m   Physical Exam  Constitutional: She appears well-developed and well-nourished. No distress.  HENT:  Head: Normocephalic and atraumatic.  Mouth/Throat: Oropharynx is clear and moist. No oropharyngeal exudate.  The patient is able to open the mouth wide, there is no trismus or torticollis, there is no objective swelling of the face, there is no tenderness along the jawline, there is some tenderness along the gingiva bilaterally.  She has multiple missing, severely eroded  teeth and several small areas of gingival swelling without fluctuance.  Her phonation is normal, there is no tenderness under the tongue, she is able to extrude her tongue without any difficulties and has a very supple neck with no lymphadenopathy  Eyes: Pupils are equal, round, and  reactive to light. Conjunctivae and EOM are normal. Right eye exhibits no discharge. Left eye exhibits no discharge. No scleral icterus.  Neck: Normal range of motion. Neck supple. No JVD present. No thyromegaly present.  Cardiovascular: Normal rate, regular rhythm, normal heart sounds and intact distal pulses. Exam reveals no gallop and no friction rub.  No murmur heard. Pulmonary/Chest: Effort normal and breath sounds normal. No respiratory distress. She has no wheezes. She has no rales.  Abdominal: Soft. Bowel sounds are normal. She exhibits no distension and no mass. There is no tenderness.  Musculoskeletal: Normal range of motion. She exhibits no edema or tenderness.  Lymphadenopathy:    She has no cervical adenopathy.  Neurological: She is alert. Coordination normal.  Skin: Skin is warm and dry. No rash noted. No erythema.  Psychiatric: She has a normal mood and affect. Her behavior is normal.  Nursing note and vitals reviewed.    ED Treatments / Results  Labs (all labs ordered are listed, but only abnormal results are displayed) Labs Reviewed - No data to display  EKG None  Radiology No results found.  Procedures Procedures (including critical care time)  Medications Ordered in ED Medications - No data to display   Initial Impression / Assessment and Plan / ED Course  I have reviewed the triage vital signs and the nursing notes.  Pertinent labs & imaging results that were available during my care of the patient were reviewed by me and considered in my medical decision making (see chart for details).    The patient's blood pressure is elevated, I will give her a refill of her lisinopril  I  will not refill the patient's Ativan which she has not had it several years as I feel this would be an appropriate especially with her underlying chronic fatigue which is severely disabling for her apparently.  She will be started on penicillin for her infection.  I think this is a reasonable medication to start, she can be followed up in the outpatient setting and she will be given resources for both local family doctors for her medical issues as well as for dental clinics that may be able to help her as well.  She has been given instructions for return and expressed her understanding.  Final Clinical Impressions(s) / ED Diagnoses   Final diagnoses:  Periapical abscess  Hypertension, essential    ED Discharge Orders        Ordered    penicillin v potassium (VEETID) 500 MG tablet  4 times daily     11/20/17 1552    lisinopril (PRINIVIL,ZESTRIL) 10 MG tablet  Daily     11/20/17 1552       Eber Hong, MD 11/20/17 1553

## 2018-01-25 ENCOUNTER — Emergency Department (HOSPITAL_COMMUNITY)
Admission: EM | Admit: 2018-01-25 | Discharge: 2018-01-25 | Disposition: A | Discharge status: Home / Self Care | Payer: Medicare Other | Attending: Emergency Medicine | Admitting: Emergency Medicine

## 2018-01-25 ENCOUNTER — Encounter (HOSPITAL_COMMUNITY): Payer: Self-pay | Admitting: Emergency Medicine

## 2018-01-25 ENCOUNTER — Other Ambulatory Visit: Payer: Self-pay

## 2018-01-25 ENCOUNTER — Emergency Department (HOSPITAL_COMMUNITY): Payer: Medicare Other

## 2018-01-25 DIAGNOSIS — I10 Essential (primary) hypertension: Secondary | ICD-10-CM | POA: Diagnosis not present

## 2018-01-25 DIAGNOSIS — Z79899 Other long term (current) drug therapy: Secondary | ICD-10-CM | POA: Insufficient documentation

## 2018-01-25 DIAGNOSIS — R42 Dizziness and giddiness: Secondary | ICD-10-CM

## 2018-01-25 DIAGNOSIS — R531 Weakness: Secondary | ICD-10-CM

## 2018-01-25 LAB — DIFFERENTIAL
BASOS PCT: 0 %
Basophils Absolute: 0 10*3/uL (ref 0.0–0.1)
EOS PCT: 1 %
Eosinophils Absolute: 0.1 10*3/uL (ref 0.0–0.7)
Lymphocytes Relative: 13 %
Lymphs Abs: 1.7 10*3/uL (ref 0.7–4.0)
MONO ABS: 0.9 10*3/uL (ref 0.1–1.0)
Monocytes Relative: 7 %
Neutro Abs: 10.1 10*3/uL — ABNORMAL HIGH (ref 1.7–7.7)
Neutrophils Relative %: 79 %

## 2018-01-25 LAB — RAPID URINE DRUG SCREEN, HOSP PERFORMED
Amphetamines: NOT DETECTED
BARBITURATES: NOT DETECTED
BENZODIAZEPINES: NOT DETECTED
COCAINE: NOT DETECTED
Opiates: NOT DETECTED
TETRAHYDROCANNABINOL: NOT DETECTED

## 2018-01-25 LAB — URINALYSIS, ROUTINE W REFLEX MICROSCOPIC
Bacteria, UA: NONE SEEN
Bilirubin Urine: NEGATIVE
Glucose, UA: NEGATIVE mg/dL
Hgb urine dipstick: NEGATIVE
Ketones, ur: NEGATIVE mg/dL
Nitrite: NEGATIVE
PH: 6 (ref 5.0–8.0)
PROTEIN: 30 mg/dL — AB
Specific Gravity, Urine: 1.015 (ref 1.005–1.030)

## 2018-01-25 LAB — CBC
HCT: 41.6 % (ref 36.0–46.0)
Hemoglobin: 14.2 g/dL (ref 12.0–15.0)
MCH: 30.3 pg (ref 26.0–34.0)
MCHC: 34.1 g/dL (ref 30.0–36.0)
MCV: 88.7 fL (ref 78.0–100.0)
PLATELETS: 285 10*3/uL (ref 150–400)
RBC: 4.69 MIL/uL (ref 3.87–5.11)
RDW: 13.9 % (ref 11.5–15.5)
WBC: 12.8 10*3/uL — ABNORMAL HIGH (ref 4.0–10.5)

## 2018-01-25 LAB — COMPREHENSIVE METABOLIC PANEL
ALT: 22 U/L (ref 0–44)
ANION GAP: 10 (ref 5–15)
AST: 22 U/L (ref 15–41)
Albumin: 4.1 g/dL (ref 3.5–5.0)
Alkaline Phosphatase: 97 U/L (ref 38–126)
BUN: 16 mg/dL (ref 8–23)
CHLORIDE: 106 mmol/L (ref 98–111)
CO2: 25 mmol/L (ref 22–32)
Calcium: 9.5 mg/dL (ref 8.9–10.3)
Creatinine, Ser: 1 mg/dL (ref 0.44–1.00)
GFR calc non Af Amer: 58 mL/min — ABNORMAL LOW (ref >60)
Glucose, Bld: 119 mg/dL — ABNORMAL HIGH (ref 70–99)
Potassium: 3.7 mmol/L (ref 3.5–5.1)
SODIUM: 141 mmol/L (ref 135–145)
Total Bilirubin: 0.5 mg/dL (ref 0.3–1.2)
Total Protein: 8 g/dL (ref 6.5–8.1)

## 2018-01-25 LAB — ETHANOL

## 2018-01-25 LAB — APTT: aPTT: 29 seconds (ref 24–36)

## 2018-01-25 LAB — PROTIME-INR
INR: 0.89
PROTHROMBIN TIME: 12 s (ref 11.4–15.2)

## 2018-01-25 MED ORDER — MECLIZINE HCL 12.5 MG PO TABS
25.0000 mg | ORAL_TABLET | Freq: Once | ORAL | Status: DC
  Filled 2018-01-25: qty 2

## 2018-01-25 MED ORDER — LISINOPRIL 10 MG PO TABS
10.0000 mg | ORAL_TABLET | Freq: Once | ORAL | Status: AC
  Administered 2018-01-25: 10 mg via ORAL
  Filled 2018-01-25: qty 1

## 2018-01-25 MED ORDER — LABETALOL HCL 5 MG/ML IV SOLN
10.0000 mg | Freq: Once | INTRAVENOUS | Status: AC
  Administered 2018-01-25: 10 mg via INTRAVENOUS
  Filled 2018-01-25: qty 4

## 2018-01-25 NOTE — ED Notes (Signed)
Patient has been eating and drinking Ginger-ale and crackers.

## 2018-01-25 NOTE — Discharge Instructions (Addendum)
Tests showed no life-threatening condition.  Follow-up with your primary care doctor. 

## 2018-01-25 NOTE — ED Provider Notes (Signed)
Patient rechecked.  She is alert and oriented x3 without any obvious neurological deficits.   Donnetta Hutching, MD 01/25/18 [phone]

## 2018-01-25 NOTE — ED Provider Notes (Signed)
[facility] EMERGENCY DEPARTMENT Provider Note   CSN: [id number] Arrival date & time: 01/25/18  1950     History   Chief Complaint Chief Complaint  Patient presents with  . Weakness    HPI Pamela Vasquez is a 67 y.o. female.  HPI Patient presented to the emergency room for evaluation of weakness nausea and vomiting that started about 4 PM.  Patient states she was at home when she suddenly started feeling weak and dizzy.  The room was spinning on her.  She started to become nauseated and she vomited.  Since vomiting the symptoms are getting slightly better but she also does have a headache.  Patient denies any trouble with speech or vision problems.  No focal numbness or weakness. Past Medical History:  Diagnosis Date  . Acid reflux   . Arthritis   . Blepharitis    Recurrent  . Chronic fatigue   . Degenerative disc disease   . Depression   . Fibromyalgia   . Hypertension   . Interstitial cystitis   . Irritable bowel syndrome (IBS)   . Rheumatic fever   . Vertigo     Patient Active Problem List   Diagnosis Date Noted  . Chest pain 03/06/2013  . Boil of buttock 02/11/2012  . Heart palpitations 12/08/2011  . Oral candidiasis 12/08/2011  . Vaginitis 12/08/2011  . Hyperlipidemia 12/08/2011  . Chronic cough 07/07/2011  . Chronic fatigue 02/08/2011  . Blepharitis 02/08/2011  . Overweight(278.02) 02/05/2011  . WEAKNESS 01/24/2007  . ANXIETY 08/22/2006  . DEPRESSION 08/22/2006  . MIGRAINE HEADACHE 08/22/2006  . PERIPHERAL NEUROPATHY 08/22/2006  . HYPERTENSION 08/22/2006  . GERD 08/22/2006  . CONSTIPATION NOS 08/22/2006  . IBS 08/22/2006  . OVERACTIVE BLADDER 08/22/2006  . OSTEOARTHRITIS 08/22/2006  . LOW BACK PAIN 08/22/2006  . FIBROMYALGIA 08/22/2006  . COLONIC POLYPS, HX OF 08/22/2006    Past Surgical History:  Procedure Laterality Date  . ABDOMINAL HYSTERECTOMY    . CARPAL TUNNEL RELEASE     bilateral  . CHOLECYSTECTOMY    . KNEE ARTHROPLASTY     twice bilateral  . OOPHORECTOMY    . WRIST ARTHROPLASTY       OB History    Gravida  2   Para  2   Term  2   Preterm      AB      Living  2     SAB      TAB      Ectopic      Multiple      Live Births               Home Medications    Prior to Admission medications   Medication Sig Start Date End Date Taking? Authorizing Provider  acetaminophen (TYLENOL) 500 MG tablet Take 1,000 mg by mouth every 6 (six) hours as needed for mild pain or headache.     [provider]  Artificial Tear Ointment (ARTIFICIAL TEARS) ointment Place 1 drop into both eyes as needed. For dry eyes    [provider]  dextromethorphan (ROBITUSSIN CHILDRENS COUGH LA) 7.5 MG/5ML SYRP Take 5 mLs by mouth every 6 (six) hours as needed.    [provider]  hydrocortisone 1 % lotion Apply 1 application topically 2 (two) times daily. 05/20/16   Raeford Razor, MD  lisinopril (PRINIVIL,ZESTRIL) 10 MG tablet Take 1 tablet (10 mg total) by mouth daily. 11/20/17   Eber Hong, MD  Multiple Vitamins-Minerals (CENTRUM SILVER ADULT  50+ PO) Take 1 tablet by mouth daily.    [provider]  omeprazole (PRILOSEC OTC) 20 MG tablet Take 20 mg by mouth daily.    [provider]  penicillin v potassium (VEETID) 500 MG tablet Take 1 tablet (500 mg total) by mouth 4 (four) times daily. 11/20/17   Eber Hong, MD  Tetrahydroz-Dextran-PEG-Povid Lurlean Horns ADVANCED RELIEF OP) Apply 2 drops to eye daily.    [provider]    Family History Family History  Problem Relation Age of Onset  . Osteoarthritis Mother   . Heart disease Mother   . Hyperlipidemia Mother   . Cancer Father        colon  . Hyperlipidemia Father   . Heart disease Father   . Rheum arthritis Brother   . Osteoarthritis Brother   . Depression Brother   . Heart disease Brother   . Hyperlipidemia Brother   . Cancer Other   . Depression Sister   . Heart disease Sister   . Hyperlipidemia  Sister     Social History Social History   Tobacco Use  . Smoking status: Never Smoker  . Smokeless tobacco: Never Used  Substance Use Topics  . Alcohol use: No  . Drug use: No     Allergies   Aspirin; Codeine; Demerol; and Nsaids   Review of Systems Review of Systems  All other systems reviewed and are negative.    Physical Exam Updated Vital Signs BP (!) 189/105   Pulse 77   Temp 97.8 F (36.6 C)   Resp 20   Wt 68 kg   SpO2 96%   BMI 25.73 kg/m   Physical Exam  Constitutional: She is oriented to person, place, and time. She appears well-developed and well-nourished. No distress.  HENT:  Head: Normocephalic and atraumatic.  Right Ear: External ear normal.  Left Ear: External ear normal.  Mouth/Throat: Oropharynx is clear and moist.  Eyes: Conjunctivae are normal. Right eye exhibits no discharge. Left eye exhibits no discharge. No scleral icterus.  Neck: Neck supple. No tracheal deviation present.  Cardiovascular: Normal rate, regular rhythm and intact distal pulses.  Pulmonary/Chest: Effort normal and breath sounds normal. No stridor. No respiratory distress. She has no wheezes. She has no rales.  Abdominal: Soft. Bowel sounds are normal. She exhibits no distension. There is no tenderness. There is no rebound and no guarding.  Musculoskeletal: She exhibits no edema or tenderness.  Neurological: She is alert and oriented to person, place, and time. She has normal strength. No cranial nerve deficit (No facial droop, extraocular movements intact, tongue midline ) or sensory deficit. She exhibits normal muscle tone. She displays no seizure activity. Coordination normal.  No pronator drift bilateral upper extrem, able to hold both legs off bed for 5 seconds, sensation intact in all extremities, no visual field cuts, no left or right sided neglect, normal finger-nose exam bilaterally, no nystagmus noted   Skin: Skin is warm and dry. No rash noted.  Psychiatric: She  has a normal mood and affect.  Nursing note and vitals reviewed.    ED Treatments / Results  Labs (all labs ordered are listed, but only abnormal results are displayed) Labs Reviewed  CBC - Abnormal; Notable for the following components:      Result Value   WBC 12.8 (*)    All other components within normal limits  DIFFERENTIAL - Abnormal; Notable for the following components:   Neutro Abs 10.1 (*)    All other components  within normal limits  URINALYSIS, ROUTINE W REFLEX MICROSCOPIC - Abnormal; Notable for the following components:   Color, Urine STRAW (*)    Protein, ur 30 (*)    Leukocytes, UA TRACE (*)    All other components within normal limits  RAPID URINE DRUG SCREEN, HOSP PERFORMED  ETHANOL  PROTIME-INR  APTT  COMPREHENSIVE METABOLIC PANEL  I-STAT TROPONIN, ED    EKG EKG Interpretation  Date/Time:  Wednesday January 25 2018 20:56:17 EDT Ventricular Rate:  82 PR Interval:    QRS Duration: 90 QT Interval:  408 QTC Calculation: 477 R Axis:   51 Text Interpretation:  Sinus rhythm Probable inferior infarct, age indeterminate Since last tracing rate slower Confirmed by Linwood Dibbles [phone]) on 01/25/2018 10:02:01 PM   Radiology Ct Head Wo Contrast  Result Date: 01/25/2018 CLINICAL DATA:  Generalize weakness which began around 1600 hours today. EXAM: CT HEAD WITHOUT CONTRAST TECHNIQUE: Contiguous axial images were obtained from the base of the skull through the vertex without intravenous contrast. COMPARISON:  11/20/2011 FINDINGS: Brain: No evidence of acute infarction, hemorrhage, hydrocephalus, extra-axial collection or mass lesion/mass effect.A choroidal fissure cyst versus remote lacunar infarct of the left basal ganglia is noted. Vascular: No hyperdense vessel or unexpected calcification. Skull: Normal. Negative for fracture or focal lesion. Sinuses/Orbits: No acute finding. Other: None. IMPRESSION: 1. No acute intracranial abnormalities identified. Stable from prior  exam. Electronically Signed   By: Signa Kell M.D.   On: 01/25/2018 21:42    Procedures Procedures (including critical care time)  Medications Ordered in ED Medications  meclizine (ANTIVERT) tablet 25 mg (25 mg Oral Refused 01/25/18 2100)  lisinopril (PRINIVIL,ZESTRIL) tablet 10 mg (10 mg Oral Given 01/25/18 2102)     Initial Impression / Assessment and Plan / ED Course  I have reviewed the triage vital signs and the nursing notes.  Pertinent labs & imaging results that were available during my care of the patient were reviewed by me and considered in my medical decision making (see chart for details).  Clinical Course as of Jan 26 2204  Wed Jan 25, 2018  2044 I discussed giving the patient something for nausea.  She does not want to take any medications right now   [JK]    Clinical Course User Index [JK] Linwood Dibbles, MD    She presented to the emergency room after having an episode of dizziness associated with nausea.  Her symptoms are suggestive of possible peripheral vertigo.  Patient's blood pressure however is significantly elevated.  She is at risk for the possibility of stroke.  No acute findings noted on the CT scan.  Plan on treatment with meclizine check her labs.  We will need to reassess and see if patient can safely ambulate.  If not she may need admission for further work-up  Final Clinical Impressions(s) / ED Diagnoses   Final diagnoses:  Weakness  Vertigo    ED Discharge Orders    None       Linwood Dibbles, MD 01/25/18 2205

## 2018-01-25 NOTE — ED Triage Notes (Signed)
Pt c/o generalized weakness that started around 1600 today with nausea.

## 2018-01-26 LAB — I-STAT TROPONIN, ED: Troponin i, poc: 0 ng/mL (ref 0.00–0.08)

## 2019-07-12 ENCOUNTER — Inpatient Hospital Stay (HOSPITAL_COMMUNITY)
Admission: EM | Admit: 2019-07-12 | Discharge: 2019-07-17 | DRG: 065 | Disposition: A | Discharge status: Skilled Nursing Facility | Payer: Medicare Other | Attending: Internal Medicine | Admitting: Internal Medicine

## 2019-07-12 ENCOUNTER — Encounter (HOSPITAL_COMMUNITY): Payer: Self-pay

## 2019-07-12 ENCOUNTER — Emergency Department (HOSPITAL_COMMUNITY): Payer: Medicare Other

## 2019-07-12 ENCOUNTER — Observation Stay (HOSPITAL_COMMUNITY): Payer: Medicare Other

## 2019-07-12 ENCOUNTER — Other Ambulatory Visit: Payer: Self-pay

## 2019-07-12 DIAGNOSIS — R4702 Dysphasia: Secondary | ICD-10-CM | POA: Diagnosis present

## 2019-07-12 DIAGNOSIS — Z8349 Family history of other endocrine, nutritional and metabolic diseases: Secondary | ICD-10-CM

## 2019-07-12 DIAGNOSIS — R29711 NIHSS score 11: Secondary | ICD-10-CM | POA: Diagnosis present

## 2019-07-12 DIAGNOSIS — R414 Neurologic neglect syndrome: Secondary | ICD-10-CM | POA: Diagnosis present

## 2019-07-12 DIAGNOSIS — T796XXA Traumatic ischemia of muscle, initial encounter: Secondary | ICD-10-CM

## 2019-07-12 DIAGNOSIS — I160 Hypertensive urgency: Secondary | ICD-10-CM | POA: Diagnosis present

## 2019-07-12 DIAGNOSIS — E876 Hypokalemia: Secondary | ICD-10-CM | POA: Diagnosis not present

## 2019-07-12 DIAGNOSIS — D72829 Elevated white blood cell count, unspecified: Secondary | ICD-10-CM

## 2019-07-12 DIAGNOSIS — G8194 Hemiplegia, unspecified affecting left nondominant side: Secondary | ICD-10-CM

## 2019-07-12 DIAGNOSIS — R270 Ataxia, unspecified: Secondary | ICD-10-CM | POA: Diagnosis present

## 2019-07-12 DIAGNOSIS — E042 Nontoxic multinodular goiter: Secondary | ICD-10-CM | POA: Diagnosis present

## 2019-07-12 DIAGNOSIS — I63521 Cerebral infarction due to unspecified occlusion or stenosis of right anterior cerebral artery: Principal | ICD-10-CM | POA: Diagnosis present

## 2019-07-12 DIAGNOSIS — R739 Hyperglycemia, unspecified: Secondary | ICD-10-CM | POA: Diagnosis present

## 2019-07-12 DIAGNOSIS — I10 Essential (primary) hypertension: Secondary | ICD-10-CM | POA: Diagnosis present

## 2019-07-12 DIAGNOSIS — M6282 Rhabdomyolysis: Secondary | ICD-10-CM | POA: Diagnosis not present

## 2019-07-12 DIAGNOSIS — Z79899 Other long term (current) drug therapy: Secondary | ICD-10-CM

## 2019-07-12 DIAGNOSIS — E782 Mixed hyperlipidemia: Secondary | ICD-10-CM | POA: Diagnosis present

## 2019-07-12 DIAGNOSIS — K029 Dental caries, unspecified: Secondary | ICD-10-CM | POA: Diagnosis present

## 2019-07-12 DIAGNOSIS — Z20822 Contact with and (suspected) exposure to covid-19: Secondary | ICD-10-CM | POA: Diagnosis present

## 2019-07-12 DIAGNOSIS — R4701 Aphasia: Secondary | ICD-10-CM | POA: Diagnosis present

## 2019-07-12 DIAGNOSIS — Z8249 Family history of ischemic heart disease and other diseases of the circulatory system: Secondary | ICD-10-CM

## 2019-07-12 DIAGNOSIS — I639 Cerebral infarction, unspecified: Secondary | ICD-10-CM

## 2019-07-12 DIAGNOSIS — R0600 Dyspnea, unspecified: Secondary | ICD-10-CM

## 2019-07-12 DIAGNOSIS — M797 Fibromyalgia: Secondary | ICD-10-CM | POA: Diagnosis present

## 2019-07-12 DIAGNOSIS — K219 Gastro-esophageal reflux disease without esophagitis: Secondary | ICD-10-CM | POA: Diagnosis present

## 2019-07-12 DIAGNOSIS — Z8261 Family history of arthritis: Secondary | ICD-10-CM

## 2019-07-12 DIAGNOSIS — Z96653 Presence of artificial knee joint, bilateral: Secondary | ICD-10-CM | POA: Diagnosis present

## 2019-07-12 LAB — CBC
HCT: 47.9 % — ABNORMAL HIGH (ref 36.0–46.0)
Hemoglobin: 16.3 g/dL — ABNORMAL HIGH (ref 12.0–15.0)
MCH: 29.7 pg (ref 26.0–34.0)
MCHC: 34 g/dL (ref 30.0–36.0)
MCV: 87.2 fL (ref 80.0–100.0)
Platelets: 339 10*3/uL (ref 150–400)
RBC: 5.49 MIL/uL — ABNORMAL HIGH (ref 3.87–5.11)
RDW: 13.8 % (ref 11.5–15.5)
WBC: 17.2 10*3/uL — ABNORMAL HIGH (ref 4.0–10.5)
nRBC: 0 % (ref 0.0–0.2)

## 2019-07-12 LAB — COMPREHENSIVE METABOLIC PANEL
ALT: 25 U/L (ref 0–44)
AST: 33 U/L (ref 15–41)
Albumin: 4.3 g/dL (ref 3.5–5.0)
Alkaline Phosphatase: 101 U/L (ref 38–126)
Anion gap: 14 (ref 5–15)
BUN: 14 mg/dL (ref 8–23)
CO2: 23 mmol/L (ref 22–32)
Calcium: 9.6 mg/dL (ref 8.9–10.3)
Chloride: 100 mmol/L (ref 98–111)
Creatinine, Ser: 0.92 mg/dL (ref 0.44–1.00)
GFR calc Af Amer: >60 mL/min (ref >60)
GFR calc non Af Amer: >60 mL/min (ref >60)
Glucose, Bld: 150 mg/dL — ABNORMAL HIGH (ref 70–99)
Potassium: 3.3 mmol/L — ABNORMAL LOW (ref 3.5–5.1)
Sodium: 137 mmol/L (ref 135–145)
Total Bilirubin: 0.8 mg/dL (ref 0.3–1.2)
Total Protein: 8.8 g/dL — ABNORMAL HIGH (ref 6.5–8.1)

## 2019-07-12 LAB — CK
Total CK: 1078 U/L — ABNORMAL HIGH (ref 38–234)
Total CK: 1204 U/L — ABNORMAL HIGH (ref 38–234)

## 2019-07-12 LAB — RESPIRATORY PANEL BY RT PCR (FLU A&B, COVID)
Influenza A by PCR: NEGATIVE
Influenza B by PCR: NEGATIVE
SARS Coronavirus 2 by RT PCR: NEGATIVE

## 2019-07-12 MED ORDER — LABETALOL HCL 5 MG/ML IV SOLN
INTRAVENOUS | Status: AC
  Administered 2019-07-12: 10 mg via INTRAVENOUS
  Filled 2019-07-12: qty 4

## 2019-07-12 MED ORDER — POTASSIUM CHLORIDE IN NACL 20-0.9 MEQ/L-% IV SOLN
INTRAVENOUS | Status: DC
  Filled 2019-07-12: qty 1000

## 2019-07-12 MED ORDER — LABETALOL HCL 5 MG/ML IV SOLN
10.0000 mg | Freq: Once | INTRAVENOUS | Status: AC
Start: 2019-07-12 — End: 2019-07-12
  Administered 2019-07-12: 10 mg via INTRAVENOUS

## 2019-07-12 MED ORDER — ENOXAPARIN SODIUM 40 MG/0.4ML ~~LOC~~ SOLN
40.0000 mg | SUBCUTANEOUS | Status: DC
  Administered 2019-07-13 – 2019-07-16 (×5): 40 mg via SUBCUTANEOUS
  Filled 2019-07-12 (×5): qty 0.4

## 2019-07-12 MED ORDER — SODIUM CHLORIDE 0.9 % IV BOLUS
1000.0000 mL | Freq: Once | INTRAVENOUS | Status: AC
  Administered 2019-07-12: 1000 mL via INTRAVENOUS

## 2019-07-12 MED ORDER — ACETAMINOPHEN 650 MG RE SUPP: 650.0000 mg | Freq: Four times a day (QID) | RECTAL | Status: DC | PRN

## 2019-07-12 MED ORDER — ACETAMINOPHEN 500 MG PO TABS
1000.0000 mg | ORAL_TABLET | Freq: Once | ORAL | Status: AC
  Administered 2019-07-12: 500 mg via ORAL
  Filled 2019-07-12: qty 2

## 2019-07-12 MED ORDER — LABETALOL HCL 5 MG/ML IV SOLN
10.0000 mg | Freq: Once | INTRAVENOUS | Status: AC
  Administered 2019-07-13: 10 mg via INTRAVENOUS
  Filled 2019-07-12: qty 4

## 2019-07-12 MED ORDER — ACETAMINOPHEN 325 MG PO TABS
650.0000 mg | ORAL_TABLET | Freq: Four times a day (QID) | ORAL | Status: DC | PRN
  Administered 2019-07-13 – 2019-07-14 (×2): 650 mg via ORAL
  Filled 2019-07-12 (×2): qty 2

## 2019-07-12 MED ORDER — HYDRALAZINE HCL 20 MG/ML IJ SOLN
10.0000 mg | Freq: Four times a day (QID) | INTRAMUSCULAR | Status: DC | PRN
  Administered 2019-07-13: 10 mg via INTRAVENOUS
  Filled 2019-07-12: qty 1

## 2019-07-12 MED ORDER — AMLODIPINE BESYLATE 5 MG PO TABS
5.0000 mg | ORAL_TABLET | Freq: Every day | ORAL | Status: DC
  Administered 2019-07-13: 5 mg via ORAL
  Filled 2019-07-12 (×2): qty 1

## 2019-07-12 NOTE — ED Triage Notes (Signed)
Pt brought in by EMS due to slide from bed approx 2 am this morning . Pt found by son this afternoon.

## 2019-07-12 NOTE — H&P (Signed)
TRH H&P    Patient Demographics:    Pamela Vasquez, is a 69 y.o. female  MRN: [id number]  DOB - 17-Jun-1950  Admit Date - 07/12/2019  Referring MD/NP/PA:  Sedonia Small  Outpatient Primary MD for the patient is Patient, No Pcp Per  Patient coming from: home  Chief complaint-  Fall    HPI:    Pamela Vasquez  is a 69 y.o. female, w fibromyalgia, gerd, hypertension,  presents with fall .  Pt states that she fell in her bedroom while trying to go to the bathroom.  Pt didn't have her phone and couldn't get back up.  Pt's son found her a couple hours later ?   Pt is a poor historian, has difficulty with speech. Pt can not move her left arm or left leg.  I spoke with her son and this apparently has been going on for about 1 year.    In ED,  T 98.5, P 101, R 20, Bp 209/121, pox 95% on RA Wt 67.1kg  Xray L spine IMPRESSION: No evidence of acute fracture or subluxation.  Xray T spine IMPRESSION: No evidence of acute fracture or subluxation.  Xray Pelvis  IMPRESSION: No acute osseous injury.  Wbc 17.2, hgb 16.3, Plt 339 Na 137, K 3.3, Bun 14, Creatinine 0.92 Ast 33, Alt 25 CPK 1,078-> 1,204  Pt will be admitted for rhabdomyolysis secondary to traumatic fall.    Review of systems:    In addition to the HPI above,  No Fever-chills, No Headache, No changes with Vision or hearing, No problems swallowing food or Liquids, No Chest pain, Cough or Shortness of Breath, No Abdominal pain, No Nausea or Vomiting, bowel movements are regular, No Blood in stool or Urine, No dysuria,   No new joints pains-aches,  No new weakness, tingling, numbness in any extremity, No recent weight gain or loss, No polyuria, polydypsia or polyphagia, No significant Mental Stressors.  All other systems reviewed and are negative.    Past History of the following :    Past Medical History:  Diagnosis Date  . Acid reflux   .  Arthritis   . Blepharitis    Recurrent  . Chronic fatigue   . Degenerative disc disease   . Depression   . Fibromyalgia   . Hypertension   . Interstitial cystitis   . Irritable bowel syndrome (IBS)   . Rheumatic fever   . Vertigo       Past Surgical History:  Procedure Laterality Date  . ABDOMINAL HYSTERECTOMY    . CARPAL TUNNEL RELEASE     bilateral  . CHOLECYSTECTOMY    . KNEE ARTHROPLASTY     twice bilateral  . OOPHORECTOMY    . WRIST ARTHROPLASTY        Social History:      Social History   Tobacco Use  . Smoking status: Never Smoker  . Smokeless tobacco: Never Used  Substance Use Topics  . Alcohol use: No       Family History :  Family History  Problem Relation Age of Onset  . Osteoarthritis Mother   . Heart disease Mother   . Hyperlipidemia Mother   . Cancer Father        colon  . Hyperlipidemia Father   . Heart disease Father   . Rheum arthritis Brother   . Osteoarthritis Brother   . Depression Brother   . Heart disease Brother   . Hyperlipidemia Brother   . Cancer Other   . Depression Sister   . Heart disease Sister   . Hyperlipidemia Sister        Home Medications:   Prior to Admission medications   Medication Sig Start Date End Date Taking? Authorizing Provider  acetaminophen (TYLENOL) 500 MG tablet Take 500 mg by mouth every 6 (six) hours as needed for mild pain or headache.    Yes [provider]  omeprazole (PRILOSEC OTC) 20 MG tablet Take 20 mg by mouth daily as needed (acid reflux).    Yes [provider]     Allergies:     Allergies  Allergen Reactions  . Aspirin Other (See Comments)    Stomach upset  . Codeine Nausea And Vomiting  . Demerol Nausea And Vomiting  . Nsaids Nausea Only     Physical Exam:   Vitals  Blood pressure (!) 200/118, pulse (!) 102, temperature 98.5 F (36.9 C), temperature source Oral, resp. rate 20, height 5\' 4"  (1.626 m), weight 67.1 kg, SpO2 93 %.  1.   General: axoxo1   2. Psychiatric: euthymic  3. Neurologic: Pt has left upper extremity wweakness with contracture. Pt unable to move left arm at all.  Pt doesn't appear to be able to move left leg well either. Can wiggle her toes. Pt has difficulty with left side neglect reflexes 2+, right, 3+ left, and also upgoing toes bilaterally,   4. HEENMT:  Anicteric, pupils 1.74mm symmetric, direct, consensual intact Neck: no jvd, no bruit  5. Respiratory : CTAB  6. Cardiovascular : rrr s1, s2, no m/g/r  7. Gastrointestinal:  Abd: soft., nt, nd, +bds   8. Skin:  Ext: no c/c/e,   9.Musculoskeletal:  Good ROM    Data Review:    CBC Recent Labs  Lab 07/12/19 1623  WBC 17.2*  HGB 16.3*  HCT 47.9*  PLT 339  MCV 87.2  MCH 29.7  MCHC 34.0  RDW 13.8   ------------------------------------------------------------------------------------------------------------------  Results for orders placed or performed during the hospital encounter of 07/12/19 (from the past 48 hour(s))  CBC     Status: Abnormal   Collection Time: 07/12/19  4:23 PM  Result Value Ref Range   WBC 17.2 (H) 4.0 - 10.5 K/uL   RBC 5.49 (H) 3.87 - 5.11 MIL/uL   Hemoglobin 16.3 (H) 12.0 - 15.0 g/dL   HCT 07/14/19 (H) 42.7 - 06.2 %   MCV 87.2 80.0 - 100.0 fL   MCH 29.7 26.0 - 34.0 pg   MCHC 34.0 30.0 - 36.0 g/dL   RDW 37.6 28.3 - 15.1 %   Platelets 339 150 - 400 K/uL   nRBC 0.0 0.0 - 0.2 %    Comment: Performed at [facility], [address], [place], [place]  Comprehensive metabolic panel     Status: Abnormal   Collection Time: 07/12/19  4:23 PM  Result Value Ref Range   Sodium 137 135 - 145 mmol/L   Potassium 3.3 (L) 3.5 - 5.1 mmol/L   Chloride 100 98 - 111 mmol/L  CO2 23 22 - 32 mmol/L   Glucose, Bld 150 (H) 70 - 99 mg/dL   BUN 14 8 - 23 mg/dL   Creatinine, Ser 3.41 0.44 - 1.00 mg/dL   Calcium 9.6 8.9 - 96.2 mg/dL   Total Protein 8.8 (H) 6.5 - 8.1 g/dL   Albumin 4.3 3.5 - 5.0 g/dL   AST 33 15  - 41 U/L   ALT 25 0 - 44 U/L   Alkaline Phosphatase 101 38 - 126 U/L   Total Bilirubin 0.8 0.3 - 1.2 mg/dL   GFR calc non Af Amer >60 >60 mL/min   GFR calc Af Amer >60 >60 mL/min   Anion gap 14 5 - 15    Comment: Performed at [facility], [address], [place], [place] [address]  CK     Status: Abnormal   Collection Time: 07/12/19  4:23 PM  Result Value Ref Range   Total CK 1,078 (H) 38 - 234 U/L    Comment: Performed at [facility], [address] [place], [place] [address]  CK     Status: Abnormal   Collection Time: 07/12/19  8:06 PM  Result Value Ref Range   Total CK 1,204 (H) 38 - 234 U/L    Comment: Performed at [facility], [address] [place], [place] [address]    Chemistries  Recent Labs  Lab 07/12/19 1623  NA 137  K 3.3*  CL 100  CO2 23  GLUCOSE 150*  BUN 14  CREATININE 0.92  CALCIUM 9.6  AST 33  ALT 25  ALKPHOS 101  BILITOT 0.8   ------------------------------------------------------------------------------------------------------------------  ------------------------------------------------------------------------------------------------------------------ GFR: Estimated Creatinine Clearance: 55.9 mL/min (by C-G formula based on SCr of 0.92 mg/dL). Liver Function Tests: Recent Labs  Lab 07/12/19 1623  AST 33  ALT 25  ALKPHOS 101  BILITOT 0.8  PROT 8.8*  ALBUMIN 4.3   No results for input(s): LIPASE, AMYLASE in the last 168 hours. No results for input(s): AMMONIA in the last 168 hours. Coagulation Profile: No results for input(s): INR, PROTIME in the last 168 hours. Cardiac Enzymes: Recent Labs  Lab 07/12/19 1623 07/12/19 2006  CKTOTAL 1,078* 1,204*   BNP (last 3 results) No results for input(s): PROBNP in the last 8760 hours. HbA1C: No results for input(s): HGBA1C in the last 72 hours. CBG: No results for input(s): GLUCAP in the last 168 hours. Lipid Profile: No results for input(s): CHOL, HDL, LDLCALC, TRIG, CHOLHDL, LDLDIRECT  in the last 72 hours. Thyroid Function Tests: No results for input(s): TSH, T4TOTAL, FREET4, T3FREE, THYROIDAB in the last 72 hours. Anemia Panel: No results for input(s): VITAMINB12, FOLATE, FERRITIN, TIBC, IRON, RETICCTPCT in the last 72 hours.  --------------------------------------------------------------------------------------------------------------- Urine analysis:    Component Value Date/Time   COLORURINE STRAW (A) 01/25/2018 2058   APPEARANCEUR CLEAR 01/25/2018 2058   LABSPEC 1.015 01/25/2018 2058   PHURINE 6.0 01/25/2018 2058   GLUCOSEU NEGATIVE 01/25/2018 2058   HGBUR NEGATIVE 01/25/2018 2058   BILIRUBINUR NEGATIVE 01/25/2018 2058   KETONESUR NEGATIVE 01/25/2018 2058   PROTEINUR 30 (A) 01/25/2018 2058   UROBILINOGEN 0.2 06/28/2013 0918   NITRITE NEGATIVE 01/25/2018 2058   LEUKOCYTESUR TRACE (A) 01/25/2018 2058      Imaging Results:    DG Thoracic Spine 2 View  Result Date: 07/12/2019 CLINICAL DATA:  Fall with back pain EXAM: THORACIC SPINE 2 VIEWS COMPARISON:  Chest radiograph dated 07/23/2015 FINDINGS: There is no evidence of thoracic spine fracture. Alignment is normal. Mild degenerative changes are  seen in the thoracic spine. IMPRESSION: No evidence of acute fracture or subluxation. Electronically Signed   By: Romona Curls M.D.   On: 07/12/2019 19:32   DG Lumbar Spine Complete  Result Date: 07/12/2019 CLINICAL DATA:  Fall with back pain EXAM: LUMBAR SPINE - COMPLETE 4+ VIEW COMPARISON:  None. FINDINGS: There is no evidence of lumbar spine fracture. Alignment is normal. Mild degenerative disc and joint disease is seen in the lumbar spine. IMPRESSION: No evidence of acute fracture or subluxation. Electronically Signed   By: Romona Curls M.D.   On: 07/12/2019 19:30   DG Pelvis 1-2 Views  Result Date: 07/12/2019 CLINICAL DATA:  Fall with pain. EXAM: PELVIS - 1-2 VIEW COMPARISON:  None. FINDINGS: There is no evidence of pelvic fracture or diastasis. No pelvic bone  lesions are seen. Mild degenerative changes are seen in the lumbar spine. IMPRESSION: No acute osseous injury. Electronically Signed   By: Romona Curls M.D.   On: 07/12/2019 19:32       Assessment & Plan:    Principal Problem:   Rhabdomyolysis Active Problems:   Hypokalemia  Rhabdomyolysis Hydrate with ns iv Check cpk in am  Hypokalemia Replete Check cmp in am  Contracture LUE (chronic) Ataxia Check CT brain r/o CVA Check hga1c, lipid PT/OT, speech Neurology to evaluate and tx  Hypertension Start amlodipine 5mg  po qday Hydralazine 10mg  iv q6h prn   Gerd PPI prn  DVT Prophylaxis-   Lovenox - SCDs   AM Labs Ordered, also please review Full Orders  Family Communication: Admission, patients condition and plan of care including tests being ordered have been discussed with the patient who indicate understanding and agree with the plan and Code Status.  Code Status:  FULL CODE per patient, notified son  That patient admitted to [facility]  Admission status:  Observation: Based on patients clinical presentation and evaluation of above clinical data, I have made determination that patient meets Observation criteria at this time.   Time spent in minutes : 70 minutes   M.D on 07/12/2019 at 10:08 PM

## 2019-07-12 NOTE — ED Provider Notes (Addendum)
[facility]-EMERGENCY DEPT [facility] Emergency Department Provider Note MRN:  [id number]  Arrival date & time: 07/12/19     Chief Complaint   Fall   History of Present Illness   Pamela Vasquez is a 69 y.o. year-old female with a history of fibromyalgia, chronic fatigue syndrome presenting to the ED with chief complaint of fall.  Patient reports trying to get out of bed at 2 AM and falling to the ground onto her back.  She was unable to get up on her own.  She lives alone and she was reportedly found by family this afternoon.  She is endorsing lower and middle back pain, moderate in severity, worse with motion.  Denies head trauma, no loss of consciousness, no blood thinners, chronic neck pain is unchanged, no chest pain or shortness of breath, no abdominal pain, no injuries to the arms or legs.  No bowel or bladder dysfunction, no numbness or weakness.  Review of Systems  A complete 10 system review of systems was obtained and all systems are negative except as noted in the HPI and PMH.   Patient's Health History    Past Medical History:  Diagnosis Date  . Acid reflux   . Arthritis   . Blepharitis    Recurrent  . Chronic fatigue   . Degenerative disc disease   . Depression   . Fibromyalgia   . Hypertension   . Interstitial cystitis   . Irritable bowel syndrome (IBS)   . Rheumatic fever   . Vertigo     Past Surgical History:  Procedure Laterality Date  . ABDOMINAL HYSTERECTOMY    . CARPAL TUNNEL RELEASE     bilateral  . CHOLECYSTECTOMY    . KNEE ARTHROPLASTY     twice bilateral  . OOPHORECTOMY    . WRIST ARTHROPLASTY      Family History  Problem Relation Age of Onset  . Osteoarthritis Mother   . Heart disease Mother   . Hyperlipidemia Mother   . Cancer Father        colon  . Hyperlipidemia Father   . Heart disease Father   . Rheum arthritis Brother   . Osteoarthritis Brother   . Depression Brother   . Heart disease Brother   . Hyperlipidemia Brother   .  Cancer Other   . Depression Sister   . Heart disease Sister   . Hyperlipidemia Sister     Social History   Socioeconomic History  . Marital status: Divorced    Spouse name: Not on file  . Number of children: Not on file  . Years of education: Not on file  . Highest education level: Not on file  Occupational History  . Not on file  Tobacco Use  . Smoking status: Never Smoker  . Smokeless tobacco: Never Used  Substance and Sexual Activity  . Alcohol use: No  . Drug use: No  . Sexual activity: Not Currently  Other Topics Concern  . Not on file  Social History Narrative  . Not on file   Social Determinants of Health   Financial Resource Strain:   . Difficulty of Paying Living Expenses: Not on file  Food Insecurity:   . Worried About Programme researcher, broadcasting/film/video in the Last Year: Not on file  . Ran Out of Food in the Last Year: Not on file  Transportation Needs:   . Lack of Transportation (Medical): Not on file  . Lack of Transportation (Non-Medical): Not on file  Physical Activity:   .  Days of Exercise per Week: Not on file  . Minutes of Exercise per Session: Not on file  Stress:   . Feeling of Stress : Not on file  Social Connections:   . Frequency of Communication with Friends and Family: Not on file  . Frequency of Social Gatherings with Friends and Family: Not on file  . Attends Religious Services: Not on file  . Active Member of Clubs or Organizations: Not on file  . Attends Archivist Meetings: Not on file  . Marital Status: Not on file  Intimate Partner Violence:   . Fear of Current or Ex-Partner: Not on file  . Emotionally Abused: Not on file  . Physically Abused: Not on file  . Sexually Abused: Not on file     Physical Exam   Vitals:   07/12/19 1700 07/12/19 1730  BP: (!) 194/124 (!) 200/118  Pulse:    Resp:    Temp:    SpO2:      CONSTITUTIONAL: Chronically ill-appearing, NAD NEURO:  Alert and oriented x 3, no focal deficits EYES:  eyes equal  and reactive ENT/NECK:  no LAD, no JVD CARDIO: Regular rate, well-perfused, normal S1 and S2 PULM:  CTAB no wheezing or rhonchi GI/GU:  normal bowel sounds, non-distended, non-tender MSK/SPINE:  No gross deformities, no edema, normal range of motion of the arms and legs, neurovascularly intact distally; mild tenderness palpation to the thoracic and lumbar spine SKIN:  no rash, atraumatic PSYCH:  Appropriate speech and behavior  *Additional and/or pertinent findings included in MDM below  Diagnostic and Interventional Summary    EKG Interpretation  Date/Time:    Ventricular Rate:    PR Interval:    QRS Duration:   QT Interval:    QTC Calculation:   R Axis:     Text Interpretation:        Cardiac Monitoring Interpretation:  Labs Reviewed  CBC - Abnormal; Notable for the following components:      Result Value   WBC 17.2 (*)    RBC 5.49 (*)    Hemoglobin 16.3 (*)    HCT 47.9 (*)    All other components within normal limits  COMPREHENSIVE METABOLIC PANEL - Abnormal; Notable for the following components:   Potassium 3.3 (*)    Glucose, Bld 150 (*)    Total Protein 8.8 (*)    All other components within normal limits  CK - Abnormal; Notable for the following components:   Total CK 1,078 (*)    All other components within normal limits  CK - Abnormal; Notable for the following components:   Total CK 1,204 (*)    All other components within normal limits  RESPIRATORY PANEL BY RT PCR (FLU A&B, COVID)    DG Lumbar Spine Complete  Final Result    DG Thoracic Spine 2 View  Final Result    DG Pelvis 1-2 Views  Final Result      Medications  acetaminophen (TYLENOL) tablet 1,000 mg (500 mg Oral Given 07/12/19 1637)  sodium chloride 0.9 % bolus 1,000 mL (0 mLs Intravenous Stopped 07/12/19 2131)  labetalol (NORMODYNE) injection 10 mg (10 mg Intravenous Given 07/12/19 1903)  sodium chloride 0.9 % bolus 1,000 mL (1,000 mLs Intravenous New Bag/Given 07/12/19 2140)      Procedures  /  Critical Care .Critical Care Performed by: Maudie Flakes, MD Authorized by: Maudie Flakes, MD   Critical care provider statement:    Critical care time (minutes):  32   Critical care was necessary to treat or prevent imminent or life-threatening deterioration of the following conditions: Rhabdomyolysis.   Critical care was time spent personally by me on the following activities:  Discussions with consultants, evaluation of patient's response to treatment, examination of patient, ordering and performing treatments and interventions, ordering and review of laboratory studies, ordering and review of radiographic studies, pulse oximetry, re-evaluation of patient's condition, obtaining history from patient or surrogate and review of old charts    ED Course and Medical Decision Making  I have reviewed the triage vital signs, the nursing notes, and pertinent available records from the EMR.  Pertinent labs & imaging results that were available during my care of the patient were reviewed by me and considered in my medical decision making (see below for details).     Considering traumatic injury to the T or L-spine given the following midline tenderness, also considering rhabdomyolysis given the extended downtime reported.  Awaiting labs and x-rays.  Labs reveal CK above 1000.  Patient given IV fluids.  X-rays are without traumatic injury.  CK was rechecked with the hope that it would be downtrending and should be appropriate for discharge.  CK unfortunately is on the rise up to 1200.  Will admit to hospital service for further care.  Elmer Sow. Pilar Plate, MD [facility] Emergency Medicine [facility] [email or web address]  Final Clinical Impressions(s) / ED Diagnoses     ICD-10-CM   1. Traumatic rhabdomyolysis, initial encounter (HCC)  [email or web address]     ED Discharge Orders    None       Discharge Instructions Discussed with and Provided to Patient:   Discharge  Instructions   None       Sabas Sous, MD 07/12/19 2154    Sabas Sous, MD 07/26/19 [phone]

## 2019-07-13 ENCOUNTER — Inpatient Hospital Stay (HOSPITAL_COMMUNITY): Payer: Medicare Other

## 2019-07-13 ENCOUNTER — Observation Stay (HOSPITAL_COMMUNITY): Payer: Medicare Other

## 2019-07-13 ENCOUNTER — Other Ambulatory Visit: Payer: Self-pay

## 2019-07-13 DIAGNOSIS — I10 Essential (primary) hypertension: Secondary | ICD-10-CM

## 2019-07-13 DIAGNOSIS — R414 Neurologic neglect syndrome: Secondary | ICD-10-CM | POA: Diagnosis present

## 2019-07-13 DIAGNOSIS — I639 Cerebral infarction, unspecified: Secondary | ICD-10-CM | POA: Diagnosis not present

## 2019-07-13 DIAGNOSIS — I160 Hypertensive urgency: Secondary | ICD-10-CM | POA: Diagnosis present

## 2019-07-13 DIAGNOSIS — G8194 Hemiplegia, unspecified affecting left nondominant side: Secondary | ICD-10-CM

## 2019-07-13 DIAGNOSIS — E782 Mixed hyperlipidemia: Secondary | ICD-10-CM | POA: Diagnosis present

## 2019-07-13 DIAGNOSIS — R739 Hyperglycemia, unspecified: Secondary | ICD-10-CM | POA: Diagnosis present

## 2019-07-13 DIAGNOSIS — K219 Gastro-esophageal reflux disease without esophagitis: Secondary | ICD-10-CM | POA: Diagnosis present

## 2019-07-13 DIAGNOSIS — R4701 Aphasia: Secondary | ICD-10-CM | POA: Diagnosis present

## 2019-07-13 DIAGNOSIS — R29711 NIHSS score 11: Secondary | ICD-10-CM | POA: Diagnosis present

## 2019-07-13 DIAGNOSIS — K029 Dental caries, unspecified: Secondary | ICD-10-CM | POA: Diagnosis present

## 2019-07-13 DIAGNOSIS — E042 Nontoxic multinodular goiter: Secondary | ICD-10-CM | POA: Diagnosis present

## 2019-07-13 DIAGNOSIS — I63521 Cerebral infarction due to unspecified occlusion or stenosis of right anterior cerebral artery: Secondary | ICD-10-CM | POA: Diagnosis present

## 2019-07-13 DIAGNOSIS — Z79899 Other long term (current) drug therapy: Secondary | ICD-10-CM | POA: Diagnosis not present

## 2019-07-13 DIAGNOSIS — I6389 Other cerebral infarction: Secondary | ICD-10-CM

## 2019-07-13 DIAGNOSIS — Z8349 Family history of other endocrine, nutritional and metabolic diseases: Secondary | ICD-10-CM | POA: Diagnosis not present

## 2019-07-13 DIAGNOSIS — Z96653 Presence of artificial knee joint, bilateral: Secondary | ICD-10-CM | POA: Diagnosis present

## 2019-07-13 DIAGNOSIS — Z8261 Family history of arthritis: Secondary | ICD-10-CM | POA: Diagnosis not present

## 2019-07-13 DIAGNOSIS — E876 Hypokalemia: Secondary | ICD-10-CM | POA: Diagnosis present

## 2019-07-13 DIAGNOSIS — R4702 Dysphasia: Secondary | ICD-10-CM | POA: Diagnosis present

## 2019-07-13 DIAGNOSIS — T796XXD Traumatic ischemia of muscle, subsequent encounter: Secondary | ICD-10-CM

## 2019-07-13 DIAGNOSIS — M797 Fibromyalgia: Secondary | ICD-10-CM | POA: Diagnosis present

## 2019-07-13 DIAGNOSIS — R270 Ataxia, unspecified: Secondary | ICD-10-CM | POA: Diagnosis present

## 2019-07-13 DIAGNOSIS — M6282 Rhabdomyolysis: Secondary | ICD-10-CM | POA: Diagnosis present

## 2019-07-13 DIAGNOSIS — Z20822 Contact with and (suspected) exposure to covid-19: Secondary | ICD-10-CM | POA: Diagnosis present

## 2019-07-13 DIAGNOSIS — Z8249 Family history of ischemic heart disease and other diseases of the circulatory system: Secondary | ICD-10-CM | POA: Diagnosis not present

## 2019-07-13 LAB — URINALYSIS, COMPLETE (UACMP) WITH MICROSCOPIC
Bacteria, UA: NONE SEEN
Bilirubin Urine: NEGATIVE
Glucose, UA: NEGATIVE mg/dL
Ketones, ur: NEGATIVE mg/dL
Leukocytes,Ua: NEGATIVE
Nitrite: NEGATIVE
Protein, ur: 30 mg/dL — AB
Specific Gravity, Urine: 1.015 (ref 1.005–1.030)
pH: 7 (ref 5.0–8.0)

## 2019-07-13 LAB — COMPREHENSIVE METABOLIC PANEL
ALT: 23 U/L (ref 0–44)
AST: 32 U/L (ref 15–41)
Albumin: 3.8 g/dL (ref 3.5–5.0)
Alkaline Phosphatase: 87 U/L (ref 38–126)
Anion gap: 12 (ref 5–15)
BUN: 13 mg/dL (ref 8–23)
CO2: 21 mmol/L — ABNORMAL LOW (ref 22–32)
Calcium: 9.2 mg/dL (ref 8.9–10.3)
Chloride: 104 mmol/L (ref 98–111)
Creatinine, Ser: 0.86 mg/dL (ref 0.44–1.00)
GFR calc Af Amer: >60 mL/min (ref >60)
GFR calc non Af Amer: >60 mL/min (ref >60)
Glucose, Bld: 131 mg/dL — ABNORMAL HIGH (ref 70–99)
Potassium: 3.6 mmol/L (ref 3.5–5.1)
Sodium: 137 mmol/L (ref 135–145)
Total Bilirubin: 0.8 mg/dL (ref 0.3–1.2)
Total Protein: 7.8 g/dL (ref 6.5–8.1)

## 2019-07-13 LAB — FOLATE: Folate: 12.2 ng/mL (ref >5.9)

## 2019-07-13 LAB — RAPID URINE DRUG SCREEN, HOSP PERFORMED
Amphetamines: NOT DETECTED
Barbiturates: NOT DETECTED
Benzodiazepines: NOT DETECTED
Cocaine: NOT DETECTED
Opiates: NOT DETECTED
Tetrahydrocannabinol: NOT DETECTED

## 2019-07-13 LAB — MAGNESIUM: Magnesium: 2 mg/dL (ref 1.7–2.4)

## 2019-07-13 LAB — TSH: TSH: 0.236 u[IU]/mL — ABNORMAL LOW (ref 0.350–4.500)

## 2019-07-13 LAB — CBC
HCT: 45.4 % (ref 36.0–46.0)
Hemoglobin: 15.7 g/dL — ABNORMAL HIGH (ref 12.0–15.0)
MCH: 30.2 pg (ref 26.0–34.0)
MCHC: 34.6 g/dL (ref 30.0–36.0)
MCV: 87.3 fL (ref 80.0–100.0)
Platelets: 249 10*3/uL (ref 150–400)
RBC: 5.2 MIL/uL — ABNORMAL HIGH (ref 3.87–5.11)
RDW: 13.9 % (ref 11.5–15.5)
WBC: 16 10*3/uL — ABNORMAL HIGH (ref 4.0–10.5)
nRBC: 0 % (ref 0.0–0.2)

## 2019-07-13 LAB — HEMOGLOBIN A1C
Hgb A1c MFr Bld: 5.5 % (ref 4.8–5.6)
Mean Plasma Glucose: 111.15 mg/dL

## 2019-07-13 LAB — T4, FREE: Free T4: 0.91 ng/dL (ref 0.61–1.12)

## 2019-07-13 LAB — ECHOCARDIOGRAM COMPLETE
Height: 64 in
Weight: 2627.88 oz

## 2019-07-13 LAB — CK TOTAL AND CKMB (NOT AT ARMC)
CK, MB: 9 ng/mL — ABNORMAL HIGH (ref 0.5–5.0)
Relative Index: 0.8 (ref 0.0–2.5)
Total CK: 1093 U/L — ABNORMAL HIGH (ref 38–234)

## 2019-07-13 LAB — LIPID PANEL
Cholesterol: 324 mg/dL — ABNORMAL HIGH (ref 0–200)
HDL: 53 mg/dL (ref >40)
LDL Cholesterol: 233 mg/dL — ABNORMAL HIGH (ref 0–99)
Total CHOL/HDL Ratio: 6.1 RATIO
Triglycerides: 188 mg/dL — ABNORMAL HIGH (ref <150)
VLDL: 38 mg/dL (ref 0–40)

## 2019-07-13 LAB — CK: Total CK: 1095 U/L — ABNORMAL HIGH (ref 38–234)

## 2019-07-13 LAB — HIV ANTIBODY (ROUTINE TESTING W REFLEX): HIV Screen 4th Generation wRfx: NONREACTIVE

## 2019-07-13 LAB — VITAMIN B12: Vitamin B-12: 220 pg/mL (ref 180–914)

## 2019-07-13 MED ORDER — AMLODIPINE BESYLATE 5 MG PO TABS
10.0000 mg | ORAL_TABLET | Freq: Every day | ORAL | Status: DC
  Administered 2019-07-13: 10 mg via ORAL
  Filled 2019-07-13: qty 2

## 2019-07-13 MED ORDER — CYANOCOBALAMIN 1000 MCG/ML IJ SOLN
1000.0000 ug | Freq: Every day | INTRAMUSCULAR | Status: AC
  Administered 2019-07-13 – 2019-07-15 (×3): 1000 ug via INTRAMUSCULAR
  Filled 2019-07-13 (×3): qty 1

## 2019-07-13 MED ORDER — LOSARTAN POTASSIUM 50 MG PO TABS
25.0000 mg | ORAL_TABLET | Freq: Every day | ORAL | Status: DC
  Administered 2019-07-13: 25 mg via ORAL
  Filled 2019-07-13: qty 1

## 2019-07-13 MED ORDER — SODIUM CHLORIDE 0.9 % IV BOLUS
500.0000 mL | Freq: Once | INTRAVENOUS | Status: AC
  Administered 2019-07-13: 500 mL via INTRAVENOUS

## 2019-07-13 MED ORDER — HYDRALAZINE HCL 20 MG/ML IJ SOLN: 10.0000 mg | Freq: Four times a day (QID) | INTRAMUSCULAR | Status: DC | PRN

## 2019-07-13 MED ORDER — POTASSIUM CHLORIDE IN NACL 20-0.9 MEQ/L-% IV SOLN: INTRAVENOUS | Status: DC

## 2019-07-13 MED ORDER — ATORVASTATIN CALCIUM 40 MG PO TABS
40.0000 mg | ORAL_TABLET | Freq: Every day | ORAL | Status: DC
  Administered 2019-07-13 – 2019-07-16 (×4): 40 mg via ORAL
  Filled 2019-07-13 (×4): qty 1

## 2019-07-13 MED ORDER — PANTOPRAZOLE SODIUM 40 MG PO TBEC: 40.0000 mg | DELAYED_RELEASE_TABLET | Freq: Every day | ORAL | Status: DC | PRN

## 2019-07-13 MED ORDER — CLOPIDOGREL BISULFATE 75 MG PO TABS
75.0000 mg | ORAL_TABLET | Freq: Every day | ORAL | Status: DC
  Administered 2019-07-14 – 2019-07-17 (×4): 75 mg via ORAL
  Filled 2019-07-13 (×4): qty 1

## 2019-07-13 MED ORDER — LABETALOL HCL 5 MG/ML IV SOLN
INTRAVENOUS | Status: AC
  Filled 2019-07-13: qty 4

## 2019-07-13 MED ORDER — OMEPRAZOLE MAGNESIUM 20 MG PO TBEC: 20.0000 mg | DELAYED_RELEASE_TABLET | Freq: Every day | ORAL | Status: DC | PRN

## 2019-07-13 MED ORDER — ASPIRIN EC 325 MG PO TBEC
325.0000 mg | DELAYED_RELEASE_TABLET | Freq: Every day | ORAL | Status: DC
  Administered 2019-07-13 – 2019-07-17 (×5): 325 mg via ORAL
  Filled 2019-07-13 (×5): qty 1

## 2019-07-13 NOTE — Plan of Care (Signed)
  Problem: Acute Rehab PT Goals(only PT should resolve) Goal: Pt Will Go Supine/Side To Sit Outcome: Progressing Flowsheets (Taken 07/13/2019 1012) Pt will go Supine/Side to Sit: with moderate assist Goal: Patient Will Perform Sitting Balance Outcome: Progressing Flowsheets (Taken 07/13/2019 1012) Patient will perform sitting balance: with minimal assist Goal: Patient Will Transfer Sit To/From Stand Outcome: Progressing Flowsheets (Taken 07/13/2019 1012) Patient will transfer sit to/from stand: with moderate assist Goal: Pt Will Transfer Bed To Chair/Chair To Bed Outcome: Progressing Flowsheets (Taken 07/13/2019 1012) Pt will Transfer Bed to Chair/Chair to Bed: with mod assist   10:12 AM, 07/13/19 Ocie Bob, MPT Physical Therapist with [facility] 336 [phone] office [phone] mobile phone

## 2019-07-13 NOTE — NC FL2 (Signed)
[place] MEDICAID FL2 LEVEL OF CARE SCREENING TOOL     IDENTIFICATION  Patient Name: Pamela Vasquez Birthdate: [birth year] Sex: female Admission Date (Current Location): 07/12/2019  [facility] and [place] Number:  [employer] and Address:  [facility],  [address] [place] [address]      Provider Number: [id number]  Attending Physician Name and Address:  Catarina Hartshorn, MD  Relative Name and Phone Number:       Current Level of Care: Hospital Recommended Level of Care: Skilled Nursing Facility Prior Approval Number:    Date Approved/Denied:   PASRR Number:    Discharge Plan: SNF    Current Diagnoses: Patient Active Problem List   Diagnosis Date Noted  . Left hemiparesis (HCC) 07/13/2019  . Acute ischemic stroke (HCC) 07/13/2019  . Rhabdomyolysis 07/12/2019  . Hypokalemia 07/12/2019  . Stroke (HCC) 07/12/2019  . Chest pain 03/06/2013  . Boil of buttock 02/11/2012  . Heart palpitations 12/08/2011  . Oral candidiasis 12/08/2011  . Vaginitis 12/08/2011  . Hyperlipidemia 12/08/2011  . Chronic cough 07/07/2011  . Chronic fatigue 02/08/2011  . Blepharitis 02/08/2011  . Overweight(278.02) 02/05/2011  . WEAKNESS 01/24/2007  . ANXIETY 08/22/2006  . DEPRESSION 08/22/2006  . MIGRAINE HEADACHE 08/22/2006  . PERIPHERAL NEUROPATHY 08/22/2006  . Hypertension 08/22/2006  . GERD 08/22/2006  . CONSTIPATION NOS 08/22/2006  . IBS 08/22/2006  . OVERACTIVE BLADDER 08/22/2006  . OSTEOARTHRITIS 08/22/2006  . LOW BACK PAIN 08/22/2006  . FIBROMYALGIA 08/22/2006  . COLONIC POLYPS, HX OF 08/22/2006    Orientation RESPIRATION BLADDER Height & Weight     Self, Time  Normal External catheter Weight: 74.5 kg Height:  5\' 4"  (162.6 cm)  BEHAVIORAL SYMPTOMS/MOOD NEUROLOGICAL BOWEL NUTRITION STATUS      Continent Diet(see DC summary)  AMBULATORY STATUS COMMUNICATION OF NEEDS Skin   Extensive Assist Verbally Normal                       Personal Care  Assistance Level of Assistance  Bathing, Feeding, Dressing Bathing Assistance: Maximum assistance Feeding assistance: Limited assistance Dressing Assistance: Maximum assistance     Functional Limitations Info  Sight, Hearing, Speech Sight Info: Adequate Hearing Info: Adequate Speech Info: Adequate    SPECIAL CARE FACTORS FREQUENCY  PT (By licensed PT), OT (By licensed OT)     PT Frequency: 5x/week              Contractures Contractures Info: Not present    Additional Factors Info  Code Status, Allergies Code Status Info: FULL CODE Allergies Info: Aspirin, Codeine, Demerol, NSAIDS           Current Medications (07/13/2019):  This is the current hospital active medication list Current Facility-Administered Medications  Medication Dose Route Frequency Provider Last Rate Last Admin  . 0.9 % NaCl with KCl 20 mEq/ L  infusion   Intravenous Continuous Tat, 07/15/2019, MD 125 mL/hr at 07/13/19 1033 New Bag at 07/13/19 1033  . acetaminophen (TYLENOL) tablet 650 mg  650 mg Oral Q6H PRN 07/15/19, MD       Or  . acetaminophen (TYLENOL) suppository 650 mg  650 mg Rectal Q6H PRN Pearson Grippe, MD      . aspirin EC tablet 325 mg  325 mg Oral Daily Tat, David, MD   325 mg at 07/13/19 1220  . atorvastatin (LIPITOR) tablet 40 mg  40 mg Oral q1800 07/15/19, MD      . Catarina Hartshorn ON  07/14/2019] clopidogrel (PLAVIX) tablet 75 mg  75 mg Oral Q breakfast [place], Kofi, MD      . cyanocobalamin ((VITAMIN B-12)) injection 1,000 mcg  1,000 mcg Intramuscular Daily Tat, David, MD   1,000 mcg at 07/13/19 1220  . enoxaparin (LOVENOX) injection 40 mg  40 mg Subcutaneous Q24H Jani Gravel, MD   40 mg at 07/13/19 0049  . hydrALAZINE (APRESOLINE) injection 10 mg  10 mg Intravenous Q6H PRN Tat, David, MD      . pantoprazole (PROTONIX) EC tablet 40 mg  40 mg Oral Daily PRN Tat, Shanon Brow, MD         Discharge Medications: Please see discharge summary for a list of discharge medications.  Relevant Imaging  Results:  Relevant Lab Results:   Additional Information SSN [id number]  Davinity Fanara, Chauncey Reading, RN

## 2019-07-13 NOTE — Progress Notes (Signed)
  MRI brain results show acute Right ACA stroke -neurology consulted -speech therapy eval -start ASA -d/c anti-HTN meds to allow for permissive HTN -hydralazine prn SBP>220 -PT/OT -carotid US -echo -start lipitor  DTat

## 2019-07-13 NOTE — Progress Notes (Signed)
Paged MD regarding patients blood pressure and pulse. No new orders at this time. Will continue to  Assess throughout shift.

## 2019-07-13 NOTE — Progress Notes (Signed)
*  PRELIMINARY RESULTS* Echocardiogram 2D Echocardiogram has been performed.  Pamela Vasquez 07/13/2019, 4:15 PM

## 2019-07-13 NOTE — Progress Notes (Signed)
PROGRESS NOTE  Pamela Vasquez GUR:[id number] DOB: January 19, 1951 DOA: 07/12/2019 PCP: Patient, No Pcp Per  Brief History:  69 year old female with a history of fibromyalgia, depression, hypertension presenting with a mechanical fall off her commode.  The patient is a poor and difficult historian.  Much of this history is obtained from review of medical record and speaking with the patient's son.  Apparently, the patient fell off the commode and because of pain and weakness she was not able to get up off the floor.  She is unable to tell me if she has had a period of generalized weakness or focal weakness prior to her fall.  Regardless, her family tried to reach her by phone and was unable to speak with her.  As result, her son came to check up on her and found her on the floor.  According to the patient's son, the patient is able to ambulate and drive and perform her activities of daily living.  Her son relates a very long history of depression and malingering type symptoms with regard to the patient.  In fact, the patient's son relates a long history of attention seeking behavior with regard to the patient.  He states that the patient is usually noncompliant with medical care.  He states that up until 2 days ago, she had not had any difficulty driving and getting around.  Aside from some dental pain, the patient has not had any recent complaints.  She denies any headache, chest pain, shortness breath, abdominal pain, nausea, vomiting, diarrhea. In the emergency department, the patient was afebrile hemodynamically stable with oxygen saturation 96% on room air.  Her blood pressure was 209/121.  BMP, LFTs, and CT of the brain were unremarkable.  WBC 17.2, hemoglobin 16.3, platelets 249,000.  X-rays of the lumbar, thoracic spine were negative.  X-ray of the pelvis was negative.  CPK was 1204.   Assessment/Plan: Generalized weakness/left hemiparesis -MRI brain -Serum E83 -Folic acid -TSH -Free T4  -Urinalysis -07/12/2019 CT brain negative -Urine drug screen  Leukocytosis -Suspect stress demargination -Chest x-ray -UA and urine culture  Rhabdomyolysis -CPK 1204 at the time of presentation -Continue IV fluids -Repeat CPK  Hypokalemia -Replete -Check magnesium  Hypertensive urgency -Restart amlodipine  Hyperglycemia -Hemoglobin A1c     Disposition Plan:   Home in 1-2 days  Family Communication:   Son updated 1/29  Consultants:  none  Code Status:  FULL  DVT Prophylaxis:  [place] Lovenox   Procedures: As Listed in Progress Note Above  Antibiotics: None       Subjective: Patient denies fevers, chills, headache, chest pain, dyspnea, nausea, vomiting, diarrhea, abdominal pain, dysuria, hematuria, hematochezia, and melena.   Objective: Vitals:   07/13/19 0411 07/13/19 0415 07/13/19 0430 07/13/19 0525  BP: (!) 211/104 (!) 172/88 (!) 167/95 (!) 149/76  Pulse:  96 99 95  Resp:    20  Temp:    97.9 F (36.6 C)  TempSrc:    Oral  SpO2:  94% 93% 96%  Weight:    74.5 kg  Height:    5\' 4"  (1.626 m)    Intake/Output Summary (Last 24 hours) at 07/13/2019 0737 Last data filed at 07/13/2019 0500 Gross per 24 hour  Intake 0.6 ml  Output -  Net 0.6 ml   Weight change:  Exam:   General:  Pt is alert, follows commands appropriately, not in acute distress  HEENT: No icterus, No thrush, No neck  mass, /AT  Cardiovascular: RRR, S1/S2, no rubs, no gallops  Respiratory: bibasilar crackles. No wheeze  Abdomen: Soft/+BS, non tender, non distended, no guarding  Extremities: No edema, No lymphangitis, No petechiae, No rashes, no synovitis Neuro:  CN II-XII intact, strength 4/5 in RUE, RLE, strength 2/5 LUE, LLE; sensation intact bilateral; no dysmetria; babinski equivocal   Data Reviewed: I have personally reviewed following labs and imaging studies Basic Metabolic Panel: Recent Labs  Lab 07/12/19 1623 07/13/19 0412  NA 137 137  K 3.3* 3.6  CL 100  104  CO2 23 21*  GLUCOSE 150* 131*  BUN 14 13  CREATININE 0.92 0.86  CALCIUM 9.6 9.2   Liver Function Tests: Recent Labs  Lab 07/12/19 1623 07/13/19 0412  AST 33 32  ALT 25 23  ALKPHOS 101 87  BILITOT 0.8 0.8  PROT 8.8* 7.8  ALBUMIN 4.3 3.8   No results for input(s): LIPASE, AMYLASE in the last 168 hours. No results for input(s): AMMONIA in the last 168 hours. Coagulation Profile: No results for input(s): INR, PROTIME in the last 168 hours. CBC: Recent Labs  Lab 07/12/19 1623 07/13/19 0412  WBC 17.2* 16.0*  HGB 16.3* 15.7*  HCT 47.9* 45.4  MCV 87.2 87.3  PLT 339 249   Cardiac Enzymes: Recent Labs  Lab 07/12/19 1623 07/12/19 2006  CKTOTAL 1,078* 1,204*   BNP: Invalid input(s): POCBNP CBG: No results for input(s): GLUCAP in the last 168 hours. HbA1C: No results for input(s): HGBA1C in the last 72 hours. Urine analysis:    Component Value Date/Time   COLORURINE STRAW (A) 01/25/2018 2058   APPEARANCEUR CLEAR 01/25/2018 2058   LABSPEC 1.015 01/25/2018 2058   PHURINE 6.0 01/25/2018 2058   GLUCOSEU NEGATIVE 01/25/2018 2058   HGBUR NEGATIVE 01/25/2018 2058   BILIRUBINUR NEGATIVE 01/25/2018 2058   KETONESUR NEGATIVE 01/25/2018 2058   PROTEINUR 30 (A) 01/25/2018 2058   UROBILINOGEN 0.2 06/28/2013 0918   NITRITE NEGATIVE 01/25/2018 2058   LEUKOCYTESUR TRACE (A) 01/25/2018 2058   Sepsis Labs: @LABRCNTIP (procalcitonin:4,lacticidven:4) ) Recent Results (from the past 240 hour(s))  Respiratory Panel by RT PCR (Flu A&B, Covid) - Nasopharyngeal Swab     Status: None   Collection Time: 07/12/19  9:27 PM   Specimen: Nasopharyngeal Swab  Result Value Ref Range Status   SARS Coronavirus 2 by RT PCR NEGATIVE NEGATIVE Final    Comment: (NOTE) SARS-CoV-2 target nucleic acids are NOT DETECTED. The SARS-CoV-2 RNA is generally detectable in upper respiratoy specimens during the acute phase of infection. The lowest concentration of SARS-CoV-2 viral copies this assay  can detect is 131 copies/mL. A negative result does not preclude SARS-Cov-2 infection and should not be used as the sole basis for treatment or other patient management decisions. A negative result may occur with  improper specimen collection/handling, submission of specimen other than nasopharyngeal swab, presence of viral mutation(s) within the areas targeted by this assay, and inadequate number of viral copies (<131 copies/mL). A negative result must be combined with clinical observations, patient history, and epidemiological information. The expected result is Negative. Fact Sheet for Patients:  07/14/19 Fact Sheet for Healthcare Providers:  https://www.moore.com/ This test is not yet ap proved or cleared by the https://www.young.biz/ FDA and  has been authorized for detection and/or diagnosis of SARS-CoV-2 by FDA under an Emergency Use Authorization (EUA). This EUA will remain  in effect (meaning this test can be used) for the duration of the COVID-19 declaration under Section 564(b)(1) of the Act, 21  U.S.C. section 360bbb-3(b)(1), unless the authorization is terminated or revoked sooner.    Influenza A by PCR NEGATIVE NEGATIVE Final   Influenza B by PCR NEGATIVE NEGATIVE Final    Comment: (NOTE) The Xpert Xpress SARS-CoV-2/FLU/RSV assay is intended as an aid in  the diagnosis of influenza from Nasopharyngeal swab specimens and  should not be used as a sole basis for treatment. Nasal washings and  aspirates are unacceptable for Xpert Xpress SARS-CoV-2/FLU/RSV  testing. Fact Sheet for Patients: https://www.moore.com/ Fact Sheet for Healthcare Providers: https://www.young.biz/ This test is not yet approved or cleared by the [place] FDA and  has been authorized for detection and/or diagnosis of SARS-CoV-2 by  FDA under an Emergency Use Authorization (EUA). This EUA will remain  in effect  (meaning this test can be used) for the duration of the  Covid-19 declaration under Section 564(b)(1) of the Act, 21  U.S.C. section 360bbb-3(b)(1), unless the authorization is  terminated or revoked. Performed at [facility], [address] [place], [place] [address]      Scheduled Meds: . amLODipine  5 mg Oral Daily  . enoxaparin (LOVENOX) injection  40 mg Subcutaneous Q24H   Continuous Infusions: . 0.9 % NaCl with KCl 20 mEq / L 150 mL/hr at 07/13/19 9629    Procedures/Studies: DG Thoracic Spine 2 View  Result Date: 07/12/2019 CLINICAL DATA:  Fall with back pain EXAM: THORACIC SPINE 2 VIEWS COMPARISON:  Chest radiograph dated 07/23/2015 FINDINGS: There is no evidence of thoracic spine fracture. Alignment is normal. Mild degenerative changes are seen in the thoracic spine. IMPRESSION: No evidence of acute fracture or subluxation. Electronically Signed   By: Romona Curls M.D.   On: 07/12/2019 19:32   DG Lumbar Spine Complete  Result Date: 07/12/2019 CLINICAL DATA:  Fall with back pain EXAM: LUMBAR SPINE - COMPLETE 4+ VIEW COMPARISON:  None. FINDINGS: There is no evidence of lumbar spine fracture. Alignment is normal. Mild degenerative disc and joint disease is seen in the lumbar spine. IMPRESSION: No evidence of acute fracture or subluxation. Electronically Signed   By: Romona Curls M.D.   On: 07/12/2019 19:30   DG Pelvis 1-2 Views  Result Date: 07/12/2019 CLINICAL DATA:  Fall with pain. EXAM: PELVIS - 1-2 VIEW COMPARISON:  None. FINDINGS: There is no evidence of pelvic fracture or diastasis. No pelvic bone lesions are seen. Mild degenerative changes are seen in the lumbar spine. IMPRESSION: No acute osseous injury. Electronically Signed   By: Romona Curls M.D.   On: 07/12/2019 19:32   CT HEAD WO CONTRAST  Result Date: 07/12/2019 CLINICAL DATA:  Ataxia left-sided weakness fall EXAM: CT HEAD WITHOUT CONTRAST TECHNIQUE: Contiguous axial images were obtained from the base of the  skull through the vertex without intravenous contrast. COMPARISON:  CT brain 01/26/2018 FINDINGS: Brain: No acute territorial infarction, hemorrhage, or intracranial mass is visualized. Patchy hypodensity in the white matter consistent with chronic small vessel ischemic change. CSF cystic density at the left basal ganglia without change, possible chronic infarct versus choroidal fissure cyst, this is without change. Similar enlargement of the ventricular system. Vascular: No hyperdense vessels.  Carotid vascular calcification Skull: Normal. Negative for fracture or focal lesion. Sinuses/Orbits: Patchy mucosal thickening in the ethmoid sinuses Other: None IMPRESSION: 1. No CT evidence for acute intracranial abnormality. 2. Mild chronic small vessel ischemic change of the white matter. Electronically Signed   By: Jasmine Pang M.D.   On: 07/12/2019 23:23    Catarina Hartshorn, DO  [facility] Pager [phone]  If 7PM-7AM, please contact night-coverage [email or web address] Password [facility] 07/13/2019, 7:37 AM   LOS: 0 days

## 2019-07-13 NOTE — Consult Note (Signed)
Pamela A. Merlene Laughter, MD     [email or web address]          Pamela Vasquez is an 69 y.o. female.   ASSESSMENT/PLAN: 1.  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  Risk factors marked dyslipidemia, hypertension and age.  Dual antiplatelet agents are recommended.  Follow-up echocardiography and carotid duplex Doppler.  Follow-up in the office with [place] in 4 weeks.  The patient likely will need occupational therapy, physical therapy and speech.  Gradual blood pressure control about 24 hours from now.  2.  Marked aphasia on examination which does not go with the current acute lesion.  I suspect that the patient could have recrudescence from a previous left frontal infarct seen on imaging.  An EEG will be obtained however given the discrepancy.  3.  Rhabdomyolysis status post fall  Patient is a 69 year old white female who apparently fell and could not get up.  She was done for an extended time.  She cannot provide much of any history.  Is unclear how long she was down.  It is clear on examination that she has significant comprehension and language impairment and therefore cannot provide any history.  The patient was noted to have left-sided weakness on evaluation.  She also has elevated CPK which is consistent with the patient falling and not being able to get up.  The review of system is extremely limited given the cognition.    GENERAL: She is awake and alert and cooperates with evaluation.  She is in no acute distress.  HEENT: Normal  ABDOMEN: soft  EXTREMITIES: No edema   BACK: Normal  SKIN: Normal by inspection.    MENTAL STATUS: She has significant difficulties comprehending and following commands.  She is oriented to her age and the the year.  She is not oriented to the month.  The patient does not have significant dysarthria.  Bedside naming indicate that she gets 3 out of 5 objects directly and one  additional with the prompting after having significant perseveration.  CRANIAL NERVES: Pupils are equal, round and reactive to light; extra ocular movements are full, there is no significant nystagmus; visual fields are full; upper and lower facial muscles are normal in strength and symmetric, there is no flattening of the nasolabial folds; tongue is midline; uvula is midline; shoulder elevation is normal.  MOTOR: Right upper extremity 4/5 and has a mild drift.  Right lower extremity 3/5 and had significant drift.  Left upper extremity is flaccid.  Left lower extremity 4/5 and significant drift.  COORDINATION: No tremors are noted.  There is no dysmetria or myoclonus or negative myoclonus.  REFLEXES: Deep tendon reflexes are symmetrical and normal. Plantar reflexes are equivocal on the right and extensor on the left.   SENSATION: Normal pain.  No neglect appreciated.    NIH stroke scale 1, 1 1, 2, 3, 2, 1 total 11.   Blood pressure (!) 205/178, pulse (!) 106, temperature 98.7 F (37.1 C), temperature source Oral, resp. rate 18, height 5\' 4"  (1.626 m), weight 74.5 kg, SpO2 95 %.  Past Medical History:  Diagnosis Date  . Acid reflux   . Arthritis   . Blepharitis    Recurrent  . Chronic fatigue   . Degenerative disc disease   . Depression   . Fibromyalgia   . Hypertension   . Interstitial cystitis   . Irritable bowel syndrome (IBS)   . Rheumatic fever   . Vertigo  Past Surgical History:  Procedure Laterality Date  . ABDOMINAL HYSTERECTOMY    . CARPAL TUNNEL RELEASE     bilateral  . CHOLECYSTECTOMY    . KNEE ARTHROPLASTY     twice bilateral  . OOPHORECTOMY    . WRIST ARTHROPLASTY      Family History  Problem Relation Age of Onset  . Osteoarthritis Mother   . Heart disease Mother   . Hyperlipidemia Mother   . Cancer Father        colon  . Hyperlipidemia Father   . Heart disease Father   . Rheum arthritis Brother   . Osteoarthritis Brother   . Depression  Brother   . Heart disease Brother   . Hyperlipidemia Brother   . Cancer Other   . Depression Sister   . Heart disease Sister   . Hyperlipidemia Sister     Social History:  reports that she has never smoked. She has never used smokeless tobacco. She reports that she does not drink alcohol or use drugs.  Allergies:  Allergies  Allergen Reactions  . Aspirin Other (See Comments)    Stomach upset  . Codeine Nausea And Vomiting  . Demerol Nausea And Vomiting  . Nsaids Nausea Only    Medications: Prior to Admission medications   Medication Sig Start Date End Date Taking? Authorizing Provider  acetaminophen (TYLENOL) 500 MG tablet Take 500 mg by mouth every 6 (six) hours as needed for mild pain or headache.    Yes [provider]  omeprazole (PRILOSEC OTC) 20 MG tablet Take 20 mg by mouth daily as needed (acid reflux).    Yes [provider]    Scheduled Meds: . aspirin EC  325 mg Oral Daily  . atorvastatin  40 mg Oral q1800  . cyanocobalamin  1,000 mcg Intramuscular Daily  . enoxaparin (LOVENOX) injection  40 mg Subcutaneous Q24H   Continuous Infusions: . 0.9 % NaCl with KCl 20 mEq / L 125 mL/hr at 07/13/19 1033   PRN Meds:.acetaminophen **OR** acetaminophen, hydrALAZINE, pantoprazole     Results for orders placed or performed during the hospital encounter of 07/12/19 (from the past 48 hour(s))  CBC     Status: Abnormal   Collection Time: 07/12/19  4:23 PM  Result Value Ref Range   WBC 17.2 (H) 4.0 - 10.5 K/uL   RBC 5.49 (H) 3.87 - 5.11 MIL/uL   Hemoglobin 16.3 (H) 12.0 - 15.0 g/dL   HCT 16.1 (H) 09.6 - 04.5 %   MCV 87.2 80.0 - 100.0 fL   MCH 29.7 26.0 - 34.0 pg   MCHC 34.0 30.0 - 36.0 g/dL   RDW 40.9 81.1 - 91.4 %   Platelets 339 150 - 400 K/uL   nRBC 0.0 0.0 - 0.2 %    Comment: Performed at [facility], [address] [place], [place] [address]  Comprehensive metabolic panel     Status: Abnormal   Collection Time: 07/12/19  4:23 PM  Result  Value Ref Range   Sodium 137 135 - 145 mmol/L   Potassium 3.3 (L) 3.5 - 5.1 mmol/L   Chloride 100 98 - 111 mmol/L   CO2 23 22 - 32 mmol/L   Glucose, Bld 150 (H) 70 - 99 mg/dL   BUN 14 8 - 23 mg/dL   Creatinine, Ser 6.21 0.44 - 1.00 mg/dL   Calcium 9.6 8.9 - 30.8 mg/dL   Total Protein 8.8 (H) 6.5 - 8.1 g/dL   Albumin 4.3 3.5 - 5.0  g/dL   AST 33 15 - 41 U/L   ALT 25 0 - 44 U/L   Alkaline Phosphatase 101 38 - 126 U/L   Total Bilirubin 0.8 0.3 - 1.2 mg/dL   GFR calc non Af Amer >60 >60 mL/min   GFR calc Af Amer >60 >60 mL/min   Anion gap 14 5 - 15    Comment: Performed at [facility], [address] [place], [place] [address]  CK     Status: Abnormal   Collection Time: 07/12/19  4:23 PM  Result Value Ref Range   Total CK 1,078 (H) 38 - 234 U/L    Comment: Performed at [facility], [address] [place], [place] [address]  CK     Status: Abnormal   Collection Time: 07/12/19  8:06 PM  Result Value Ref Range   Total CK 1,204 (H) 38 - 234 U/L    Comment: Performed at [facility], [address] [place], [place] [address]  Respiratory Panel by RT PCR (Flu A&B, Covid) - Nasopharyngeal Swab     Status: None   Collection Time: 07/12/19  9:27 PM   Specimen: Nasopharyngeal Swab  Result Value Ref Range   SARS Coronavirus 2 by RT PCR NEGATIVE NEGATIVE    Comment: (NOTE) SARS-CoV-2 target nucleic acids are NOT DETECTED. The SARS-CoV-2 RNA is generally detectable in upper respiratoy specimens during the acute phase of infection. The lowest concentration of SARS-CoV-2 viral copies this assay can detect is 131 copies/mL. A negative result does not preclude SARS-Cov-2 infection and should not be used as the sole basis for treatment or other patient management decisions. A negative result may occur with  improper specimen collection/handling, submission of specimen other than nasopharyngeal swab, presence of viral mutation(s) within the areas targeted by this assay, and inadequate number of  viral copies (<131 copies/mL). A negative result must be combined with clinical observations, patient history, and epidemiological information. The expected result is Negative. Fact Sheet for Patients:  https://www.moore.com/ Fact Sheet for Healthcare Providers:  https://www.young.biz/ This test is not yet ap proved or cleared by the [place] FDA and  has been authorized for detection and/or diagnosis of SARS-CoV-2 by FDA under an Emergency Use Authorization (EUA). This EUA will remain  in effect (meaning this test can be used) for the duration of the COVID-19 declaration under Section 564(b)(1) of the Act, 21 U.S.C. section 360bbb-3(b)(1), unless the authorization is terminated or revoked sooner.    Influenza A by PCR NEGATIVE NEGATIVE   Influenza B by PCR NEGATIVE NEGATIVE    Comment: (NOTE) The Xpert Xpress SARS-CoV-2/FLU/RSV assay is intended as an aid in  the diagnosis of influenza from Nasopharyngeal swab specimens and  should not be used as a sole basis for treatment. Nasal washings and  aspirates are unacceptable for Xpert Xpress SARS-CoV-2/FLU/RSV  testing. Fact Sheet for Patients: https://www.moore.com/ Fact Sheet for Healthcare Providers: https://www.young.biz/ This test is not yet approved or cleared by the [place] FDA and  has been authorized for detection and/or diagnosis of SARS-CoV-2 by  FDA under an Emergency Use Authorization (EUA). This EUA will remain  in effect (meaning this test can be used) for the duration of the  Covid-19 declaration under Section 564(b)(1) of the Act, 21  U.S.C. section 360bbb-3(b)(1), unless the authorization is  terminated or revoked. Performed at [facility], [address], [place], [place] [address]   HIV Antibody (routine testing w rflx)     Status: None   Collection  Time: 07/13/19  4:12 AM  Result Value Ref Range   HIV Screen 4th Generation  wRfx NON REACTIVE NON REACTIVE    Comment: Performed at [facility], [address] [place], [place] [address]  Comprehensive metabolic panel     Status: Abnormal   Collection Time: 07/13/19  4:12 AM  Result Value Ref Range   Sodium 137 135 - 145 mmol/L   Potassium 3.6 3.5 - 5.1 mmol/L   Chloride 104 98 - 111 mmol/L   CO2 21 (L) 22 - 32 mmol/L   Glucose, Bld 131 (H) 70 - 99 mg/dL   BUN 13 8 - 23 mg/dL   Creatinine, Ser 8.10 0.44 - 1.00 mg/dL   Calcium 9.2 8.9 - 17.5 mg/dL   Total Protein 7.8 6.5 - 8.1 g/dL   Albumin 3.8 3.5 - 5.0 g/dL   AST 32 15 - 41 U/L   ALT 23 0 - 44 U/L   Alkaline Phosphatase 87 38 - 126 U/L   Total Bilirubin 0.8 0.3 - 1.2 mg/dL   GFR calc non Af Amer >60 >60 mL/min   GFR calc Af Amer >60 >60 mL/min   Anion gap 12 5 - 15    Comment: Performed at [facility], [address], [place], [place] [address]  CBC     Status: Abnormal   Collection Time: 07/13/19  4:12 AM  Result Value Ref Range   WBC 16.0 (H) 4.0 - 10.5 K/uL   RBC 5.20 (H) 3.87 - 5.11 MIL/uL   Hemoglobin 15.7 (H) 12.0 - 15.0 g/dL   HCT 52.7 78.2 - 42.3 %   MCV 87.3 80.0 - 100.0 fL   MCH 30.2 26.0 - 34.0 pg   MCHC 34.6 30.0 - 36.0 g/dL   RDW 53.6 14.4 - 31.5 %   Platelets 249 150 - 400 K/uL   nRBC 0.0 0.0 - 0.2 %    Comment: Performed at [facility], [address], [place], [place] [address]  CK total and CKMB (cardiac)not at [facility]     Status: Abnormal   Collection Time: 07/13/19  4:12 AM  Result Value Ref Range   Total CK 1,093 (H) 38 - 234 U/L   CK, MB 9.0 (H) 0.5 - 5.0 ng/mL   Relative Index 0.8 0.0 - 2.5    Comment: Performed at [facility], [address] [place], [place] [address]  Hemoglobin A1c     Status: None   Collection Time: 07/13/19  4:12 AM  Result Value Ref Range   Hgb A1c MFr Bld 5.5 4.8 - 5.6 %    Comment: (NOTE) Pre diabetes:          5.7%-6.4% Diabetes:              >6.4% Glycemic control for   <7.0% adults with diabetes    Mean Plasma Glucose  111.15 mg/dL    Comment: Performed at [facility], [address] [place], [place] [address]  Lipid panel     Status: Abnormal   Collection Time: 07/13/19  4:12 AM  Result Value Ref Range   Cholesterol 324 (H) 0 - 200 mg/dL   Triglycerides 712 (H) <150 mg/dL   HDL 53 >45 mg/dL   Total CHOL/HDL Ratio 6.1 RATIO   VLDL 38 0 - 40 mg/dL   LDL Cholesterol 809 (H) 0 - 99 mg/dL    Comment:        Total Cholesterol/HDL:CHD Risk Coronary Heart Disease  Risk Table                     Men   Women  1/2 Average Risk   3.4   3.3  Average Risk       5.0   4.4  2 X Average Risk   9.6   7.1  3 X Average Risk  23.4   11.0        Use the calculated Patient Ratio above and the CHD Risk Table to determine the patient's CHD Risk.        ATP III CLASSIFICATION (LDL):  <100     mg/dL   Optimal  924-268  mg/dL   Near or Above                    Optimal  130-159  mg/dL   Borderline  341-962  mg/dL   High  >229     mg/dL   Very High Performed at [facility], [address], [place], [place] [address]   CK     Status: Abnormal   Collection Time: 07/13/19  4:12 AM  Result Value Ref Range   Total CK 1,095 (H) 38 - 234 U/L    Comment: Performed at [facility], [address], [place], [place] [address]  Vitamin B12     Status: None   Collection Time: 07/13/19  7:53 AM  Result Value Ref Range   Vitamin B-12 220 180 - 914 pg/mL    Comment: (NOTE) This assay is not validated for testing neonatal or myeloproliferative syndrome specimens for Vitamin B12 levels. Performed at [facility], [address], [place], [place] [address]   Folate     Status: None   Collection Time: 07/13/19  7:53 AM  Result Value Ref Range   Folate 12.2 >5.9 ng/mL    Comment: Performed at [facility], [address], [place], [place] [address]  TSH     Status: Abnormal   Collection Time: 07/13/19  7:53 AM  Result Value Ref Range   TSH 0.236 (L) 0.350 - 4.500 uIU/mL    Comment: Performed by a 3rd Generation assay  with a functional sensitivity of <=0.01 uIU/mL. Performed at [facility], [address], [place], [place] [address]   Magnesium     Status: None   Collection Time: 07/13/19  7:53 AM  Result Value Ref Range   Magnesium 2.0 1.7 - 2.4 mg/dL    Comment: Performed at [facility], [address], [place], [place] [address]  Urinalysis, Complete w Microscopic     Status: Abnormal   Collection Time: 07/13/19 11:22 AM  Result Value Ref Range   Color, Urine YELLOW YELLOW   APPearance CLEAR CLEAR   Specific Gravity, Urine 1.015 1.005 - 1.030   pH 7.0 5.0 - 8.0   Glucose, UA NEGATIVE NEGATIVE mg/dL   Hgb urine dipstick SMALL (A) NEGATIVE   Bilirubin Urine NEGATIVE NEGATIVE   Ketones, ur NEGATIVE NEGATIVE mg/dL   Protein, ur 30 (A) NEGATIVE mg/dL   Nitrite NEGATIVE NEGATIVE   Leukocytes,Ua NEGATIVE NEGATIVE   RBC / HPF 0-5 0 - 5 RBC/hpf   WBC, UA 0-5 0 - 5 WBC/hpf   Bacteria, UA NONE SEEN NONE SEEN   Mucus PRESENT     Comment: Performed at [facility], [address] [place], [place] [address]  Urine rapid drug screen (hosp performed)     Status: None   Collection Time: 07/13/19 11:23 AM  Result Value Ref Range   Opiates NONE DETECTED NONE DETECTED   Cocaine NONE DETECTED NONE DETECTED   Benzodiazepines NONE DETECTED NONE DETECTED   Amphetamines NONE DETECTED NONE DETECTED   Tetrahydrocannabinol NONE DETECTED NONE DETECTED   Barbiturates NONE DETECTED NONE DETECTED    Comment: (NOTE) DRUG SCREEN FOR MEDICAL PURPOSES ONLY.  IF CONFIRMATION IS NEEDED FOR ANY PURPOSE, NOTIFY LAB WITHIN 5 DAYS. LOWEST DETECTABLE LIMITS FOR URINE DRUG SCREEN Drug Class                     Cutoff (ng/mL) Amphetamine and metabolites    1000 Barbiturate and metabolites    200 Benzodiazepine                 200 Tricyclics and metabolites     300 Opiates and metabolites        300 Cocaine and metabolites        300 THC                            50 Performed at [facility], [address],  [place], [place] [address]     Studies/Results:    BRAIN MRI MRA FINDINGS: MRI HEAD FINDINGS  The study is mildly motion degraded.  Brain: There is a moderately large acute distal right ACA infarct involving the parasagittal frontal and parietal lobes, cingulate gyrus, and body of the corpus callosum. There is no associated hemorrhage. T2 hyperintensities elsewhere in the cerebral white matter bilaterally are nonspecific but compatible with mild chronic small vessel ischemic disease. A 1 cm T2 hyperintensity inferiorly in the left basal ganglia/subinsular region was present on a 2013 head CT and may reflect a chronic lacunar infarct versus a dilated perivascular space or developmental cyst, with mild surrounding gliosis potentially favoring an old infarct.  Ventriculomegaly is favored to reflect moderately advanced central predominant cerebral atrophy over hydrocephalus. A few chronic microhemorrhages are noted in the cerebellum bilaterally likely related to chronic hypertension. There is also a single chronic microhemorrhage in the medial left frontoparietal region.  Vascular: Major intracranial vascular flow voids are preserved.  Skull and upper cervical spine: No suspicious marrow lesion.  Sinuses/Orbits: Paranasal sinuses and mastoid air cells are clear. Unremarkable orbits.  Other: None.  MRA HEAD FINDINGS  The study is motion degraded including moderately severe motion through the ACA and MCA branches.  The visualized distal vertebral arteries are patent to the basilar and codominant. Patent left PICA, right AICA, and bilateral SCA origins are visualized. The basilar artery is patent with mild stenosis versus artifact in its midportion. The PCAs are patent proximally with regions of signal loss involving the distal P1 and right greater than left P2 segments bilaterally suggesting severe stenoses although this may also be in part artifactual.  The  internal carotid arteries are widely patent from skull base to carotid termini. A1 and M1 segments are patent without evidence of flow limiting stenosis. No proximal branch occlusion is evident in the anterior circulation although branch vessel assessment is limited by motion. No aneurysm is identified.  IMPRESSION: 1. Moderately large acute distal right ACA infarct. 2. Mild chronic small vessel ischemic disease. 3. Moderately advanced cerebral atrophy. 4. Motion degraded head MRA without evidence of large vessel occlusion or flow limiting proximal stenosis in the anterior circulation. 5. Potentially severe proximal PCA stenoses bilaterally.      The brain MRI scan and MRA are both reviewed in  person. There is a large right anterior cerebral artery infarct seen on DWI with corresponding reduced signal on the ADC scan. This involves the most of the medial frontal low. There is marked global atrophy and mild to moderate periventricular leukoencephalopathy. There is able moderate to large size deep left frontal encephalomalacia subcortical to the insular cortex on the left side consistent with a remote infarct. MRA shows drop of signal of the right posterior cerebral artery and some irregularity of of the left posterior cerebral artery. The ACA looks patent bilaterally.   Hezzie Karim A. Gerilyn Pilgrim, M.D.  Diplomate, Biomedical engineer of Psychiatry and Neurology ( Neurology). 07/13/2019, 2:18 PM

## 2019-07-13 NOTE — Evaluation (Signed)
Physical Therapy Evaluation Patient Details Name: Pamela Vasquez MRN: [id number] DOB: Apr 05, 1951 Today's Date: 07/13/2019   History of Present Illness  Pamela Vasquez  is a 69 y.o. female, w fibromyalgia, gerd, hypertension,  presents with fall .  Pt states that she fell in her bedroom while trying to go to the bathroom.  Pt didn't have her phone and couldn't get back up.  Pt's son found her a couple hours later ?   Pt is a poor historian, has difficulty with speech. Pt can not move her left arm or left leg.  I spoke with her son and this apparently has been going on for about 1 year.    Clinical Impression  Patient presents with flaccid LUE and limited movement of LLE which is possibly baseline per chart.  Patient limited for using RUE during bed mobility requiring Max assist to sit up at bedside, demonstrates poor trunk control with frequent falling backwards due to weakness, limited to a couple of steps during stand pivot transfer with most weightbearing on RLE.  Patient tolerated sitting up in chair after therapy - RN notified.  Patient will benefit from continued physical therapy in hospital and recommended venue below to increase strength, balance, endurance for safe ADLs and gait.     Follow Up Recommendations SNF    Equipment Recommendations  None recommended by PT    Recommendations for Other Services       Precautions / Restrictions Precautions Precautions: Fall Restrictions Weight Bearing Restrictions: No      Mobility  Bed Mobility Overal bed mobility: Needs Assistance Bed Mobility: Supine to Sit     Supine to sit: Max assist     General bed mobility comments: unable to use LLE due to flaccid, poor carry over for using RUE due to weakness  Transfers Overall transfer level: Needs assistance Equipment used: 1 person hand held assist Transfers: Sit to/from Stand;Stand Pivot Transfers Sit to Stand: Max assist Stand pivot transfers: Max assist       General  transfer comment: very unsteady on feet, most weightbearing on RLE  Ambulation/Gait Ambulation/Gait assistance: Max assist Gait Distance (Feet): 2 Feet Assistive device: 1 person hand held assist Gait Pattern/deviations: Decreased step length - right;Decreased step length - left;Decreased stance time - left;Decreased stride length Gait velocity: slow   General Gait Details: limited to a couple of slow unsteady labored steps to transfer to Lexicographer    Modified Rankin (Stroke Patients Only)       Balance Overall balance assessment: Needs assistance Sitting-balance support: Feet supported;Single extremity supported Sitting balance-Leahy Scale: Poor Sitting balance - Comments: frequent falling backwards and to the left seated at EOB Postural control: Posterior lean Standing balance support: During functional activity;No upper extremity supported Standing balance-Leahy Scale: Poor Standing balance comment: with bilateral hand held assistance                             Pertinent Vitals/Pain Pain Assessment: Faces Faces Pain Scale: Hurts a little bit Pain Location: low bac Pain Descriptors / Indicators: Aching Pain Intervention(s): Limited activity within patient's tolerance;Monitored during session;Repositioned    Home Living Family/patient expects to be discharged to:: Private residence   Available Help at Discharge: Family;Available PRN/intermittently Type of Home: House Home Access: Stairs to enter Entrance Stairs-Rails: Right Entrance Stairs-Number of Steps: 3 Home Layout: Two level;Able to live  on main level with bedroom/bathroom Home Equipment: Radiation protection practitioner - 2 wheels;Shower seat - built in;Hand held shower head      Prior Function Level of Independence: Independent with assistive device(s)         Comments: non-ambulatory, Mod Indep transfers to electric scooter, able to take a  few steps during transfers     Hand Dominance        Extremity/Trunk Assessment   Upper Extremity Assessment Upper Extremity Assessment: Defer to OT evaluation    Lower Extremity Assessment Lower Extremity Assessment: Generalized weakness;RLE deficits/detail;LLE deficits/detail RLE Deficits / Details: grossly -4/5 RLE Sensation: WNL RLE Coordination: WNL LLE Deficits / Details: grossly -3/5, limited for ankle dorsilflexion mostly due to tightness, possible heelcord contracture LLE Sensation: WNL LLE Coordination: decreased gross motor       Communication   Communication: No difficulties  Cognition Arousal/Alertness: Awake/alert;Lethargic Behavior During Therapy: WFL for tasks assessed/performed;Flat affect Overall Cognitive Status: Within Functional Limits for tasks assessed                                 General Comments: occasionally  lethargic      General Comments      Exercises     Assessment/Plan    PT Assessment Patient needs continued PT services  PT Problem List Decreased strength;Decreased activity tolerance;Decreased balance;Decreased mobility       PT Treatment Interventions Balance training;Functional mobility training;Therapeutic activities;Therapeutic exercise;Wheelchair mobility training    PT Goals (Current goals can be found in the Care Plan section)  Acute Rehab PT Goals Patient Stated Goal: return home after rehab PT Goal Formulation: With patient Time For Goal Achievement: 07/27/19 Potential to Achieve Goals: Good    Frequency Min 3X/week   Barriers to discharge        Co-evaluation               AM-PAC PT "6 Clicks" Mobility  Outcome Measure Help needed turning from your back to your side while in a flat bed without using bedrails?: A Lot Help needed moving from lying on your back to sitting on the side of a flat bed without using bedrails?: A Lot Help needed moving to and from a bed to a chair (including a  wheelchair)?: A Lot Help needed standing up from a chair using your arms (e.g., wheelchair or bedside chair)?: A Lot Help needed to walk in hospital room?: Total Help needed climbing 3-5 steps with a railing? : Total 6 Click Score: 10    End of Session   Activity Tolerance: Patient tolerated treatment well;Patient limited by fatigue Patient left: in chair;with call bell/phone within reach;with chair alarm set Nurse Communication: Mobility status PT Visit Diagnosis: History of falling (Z91.81);Muscle weakness (generalized) (M62.81);Unsteadiness on feet (R26.81)    Time: 0865-7846 PT Time Calculation (min) (ACUTE ONLY): 34 min   Charges:   PT Evaluation $PT Eval Moderate Complexity: 1 Mod PT Treatments $Therapeutic Activity: 23-37 mins        10:10 AM, 07/13/19 Lonell Grandchild, MPT Physical Therapist with [facility] [phone] office [phone] mobile phone

## 2019-07-13 NOTE — Progress Notes (Signed)
OT Cancellation Note  Patient Details Name: Pamela Vasquez MRN: [id number] DOB: 02-Dec-1950   Cancelled Treatment:    Reason Eval/Treat Not Completed: Patient declined, no reason specified;Other (comment). Attempted to evaluate pt this am. Pt initially confused asking "don't you mean good evening" after OT said good morning. Reoriented to time and pt able to report she was at the hospital for a fall. Pt uncooperative, occasionally answering questions at other times looking away and not answering. Pt able to complete face washing using right hand, OT cuing to attend to left side of face. Pt refusing to answer questions about LUE, shaking head and turning away. Pt with full P/ROM on left, no active muscle engagement. Pt not attending to left side during session. Pt inconsistent in following directions, unable to attempt additional ADLs due to pts behavior and seemingly no interest in cooperating this am. Will attempt to see pt at a later time if scheduling allows.    Ezra Sites, OTR/L  [phone] 07/13/2019, 9:55 AM

## 2019-07-13 NOTE — Evaluation (Signed)
Clinical/Bedside Swallow Evaluation Patient Details  Name: Pamela Vasquez MRN: [id number] Date of Birth: July 21, 1950  Today's Date: 07/13/2019 Time: SLP Start Time (ACUTE ONLY): 9628 SLP Stop Time (ACUTE ONLY): 0928 SLP Time Calculation (min) (ACUTE ONLY): 23 min  Past Medical History:  Past Medical History:  Diagnosis Date  . Acid reflux   . Arthritis   . Blepharitis    Recurrent  . Chronic fatigue   . Degenerative disc disease   . Depression   . Fibromyalgia   . Hypertension   . Interstitial cystitis   . Irritable bowel syndrome (IBS)   . Rheumatic fever   . Vertigo    Past Surgical History:  Past Surgical History:  Procedure Laterality Date  . ABDOMINAL HYSTERECTOMY    . CARPAL TUNNEL RELEASE     bilateral  . CHOLECYSTECTOMY    . KNEE ARTHROPLASTY     twice bilateral  . OOPHORECTOMY    . WRIST ARTHROPLASTY     HPI:  69 year old female with a history of fibromyalgia, depression, hypertension presenting with a mechanical fall off her commode.  The patient is a poor and difficult historian.  Much of this history is obtained from review of medical record and speaking with the patient's son.  Apparently, the patient fell off the commode and because of pain and weakness she was not able to get up off the floor.  She is unable to tell me if she has had a period of generalized weakness or focal weakness prior to her fall.  Regardless, her family tried to reach her by phone and was unable to speak with her.  As result, her son came to check up on her and found her on the floor.  According to the patient's son, the patient is able to ambulate and drive and perform her activities of daily living.  Her son relates a very long history of depression and malingering type symptoms with regard to the patient.  In fact, the patient's son relates a long history of attention seeking behavior with regard to the patient.  He states that the patient is usually noncompliant with medical care.  He  states that up until 2 days ago, she had not had any difficulty driving and getting around.  Aside from some dental pain, the patient has not had any recent complaints.     Assessment / Plan / Recommendation Clinical Impression  Clinical swallowing evaluation completed with Pt's breakfast meal; Pt required cueing to rouse and remain alert for the first few minutes of evaluation however she became more alert as eval went on. Pt was disoriented to time and reported it was 5:00pm and did not understand why staff was offering coffee and breakfast at "this time of day"; SLP reoriented Pt to time. Pt reports that she gets "strangled'" often at home. Today she consumed thin liquids, puree textures and mech soft and regular textures without overt s/sx of aspriation. She did demonstrate prolonged mastication and mild residue with solid textures. Pt reports most frequently getting choked on cornbread; SLP provided education that Pt should moisten dry items such as cornbread or should avoid problematic food items all together. SLP further reviewed universal aspiration precautions and specifically encouraged Pt to always sit up at 90 degrees during PO, utilize slow rate and alternate bites and sips. Recommend continue with regular diet and thin liquids; meds ok whole with liquids. There are no further ST needs noted at this time, ST to sign off.  SLP Visit Diagnosis:  Dysphagia, unspecified (R13.10)    Aspiration Risk  Mild aspiration risk    Diet Recommendation Regular;Thin liquid   Liquid Administration via: Straw;Cup Medication Administration: Whole meds with liquid Supervision: Intermittent supervision to cue for compensatory strategies;Patient able to self feed(Secondary to Pt's mentation, she may require cueing) Compensations: Minimize environmental distractions;Slow rate;Small sips/bites;Follow solids with liquid Postural Changes: Seated upright at 90 degrees;Remain upright for at least 30 minutes after po  intake    Other  Recommendations Oral Care Recommendations: Oral care BID   Follow up Recommendations None        Swallow Study   General Date of Onset: 07/12/19 HPI: 69 year old female with a history of fibromyalgia, depression, hypertension presenting with a mechanical fall off her commode.  The patient is a poor and difficult historian.  Much of this history is obtained from review of medical record and speaking with the patient's son.  Apparently, the patient fell off the commode and because of pain and weakness she was not able to get up off the floor.  She is unable to tell me if she has had a period of generalized weakness or focal weakness prior to her fall.  Regardless, her family tried to reach her by phone and was unable to speak with her.  As result, her son came to check up on her and found her on the floor.  According to the patient's son, the patient is able to ambulate and drive and perform her activities of daily living.  Her son relates a very long history of depression and malingering type symptoms with regard to the patient.  In fact, the patient's son relates a long history of attention seeking behavior with regard to the patient.  He states that the patient is usually noncompliant with medical care.  He states that up until 2 days ago, she had not had any difficulty driving and getting around.  Aside from some dental pain, the patient has not had any recent complaints.   Type of Study: Bedside Swallow Evaluation Previous Swallow Assessment: none in chart Diet Prior to this Study: Regular;Thin liquids Temperature Spikes Noted: No History of Recent Intubation: No Behavior/Cognition: Cooperative;Requires cueing;Distractible;Lethargic/Drowsy Oral Cavity Assessment: Within Functional Limits Oral Care Completed by SLP: Recent completion by staff Oral Cavity - Dentition: Adequate natural dentition Vision: Functional for self-feeding Self-Feeding Abilities: Able to feed  self Patient Positioning: Upright in bed Baseline Vocal Quality: Normal Volitional Cough: Strong Volitional Swallow: Able to elicit    Oral/Motor/Sensory Function Overall Oral Motor/Sensory Function: Within functional limits   Ice Chips Ice chips: Within functional limits   Thin Liquid Thin Liquid: Within functional limits    Nectar Thick Nectar Thick Liquid: Not tested   Honey Thick Honey Thick Liquid: Not tested   Puree Puree: Within functional limits   Solid     Solid: Impaired Presentation: Self Fed Oral Phase Impairments: Reduced lingual movement/coordination Oral Phase Functional Implications: Prolonged oral transit;Oral residue     Maloree Uplinger H. Roddie Mc, CCC-SLP Speech Language Pathologist  Wende Bushy 07/13/2019,9:35 AM

## 2019-07-13 NOTE — Progress Notes (Signed)
MD placed order for bolus of NS and telemetry order. Will continue to monitor.

## 2019-07-14 DIAGNOSIS — I639 Cerebral infarction, unspecified: Secondary | ICD-10-CM

## 2019-07-14 LAB — BASIC METABOLIC PANEL
Anion gap: 10 (ref 5–15)
BUN: 14 mg/dL (ref 8–23)
CO2: 20 mmol/L — ABNORMAL LOW (ref 22–32)
Calcium: 8.9 mg/dL (ref 8.9–10.3)
Chloride: 110 mmol/L (ref 98–111)
Creatinine, Ser: 0.81 mg/dL (ref 0.44–1.00)
GFR calc Af Amer: >60 mL/min (ref >60)
GFR calc non Af Amer: >60 mL/min (ref >60)
Glucose, Bld: 103 mg/dL — ABNORMAL HIGH (ref 70–99)
Potassium: 3.6 mmol/L (ref 3.5–5.1)
Sodium: 140 mmol/L (ref 135–145)

## 2019-07-14 LAB — CBC
HCT: 42.5 % (ref 36.0–46.0)
Hemoglobin: 13.8 g/dL (ref 12.0–15.0)
MCH: 29.4 pg (ref 26.0–34.0)
MCHC: 32.5 g/dL (ref 30.0–36.0)
MCV: 90.4 fL (ref 80.0–100.0)
Platelets: 295 10*3/uL (ref 150–400)
RBC: 4.7 MIL/uL (ref 3.87–5.11)
RDW: 14.6 % (ref 11.5–15.5)
WBC: 12.1 10*3/uL — ABNORMAL HIGH (ref 4.0–10.5)
nRBC: 0 % (ref 0.0–0.2)

## 2019-07-14 LAB — URINE CULTURE: Culture: NO GROWTH

## 2019-07-14 LAB — MAGNESIUM: Magnesium: 2.1 mg/dL (ref 1.7–2.4)

## 2019-07-14 LAB — T4, FREE: Free T4: 0.84 ng/dL (ref 0.61–1.12)

## 2019-07-14 LAB — CK: Total CK: 517 U/L — ABNORMAL HIGH (ref 38–234)

## 2019-07-14 LAB — D-DIMER, QUANTITATIVE: D-Dimer, Quant: 1 ug/mL-FEU — ABNORMAL HIGH (ref 0.00–0.50)

## 2019-07-14 MED ORDER — METOPROLOL TARTRATE 25 MG PO TABS
12.5000 mg | ORAL_TABLET | Freq: Two times a day (BID) | ORAL | Status: DC
  Administered 2019-07-14 – 2019-07-15 (×3): 12.5 mg via ORAL
  Filled 2019-07-14 (×3): qty 1

## 2019-07-14 MED ORDER — POTASSIUM CHLORIDE IN NACL 20-0.9 MEQ/L-% IV SOLN: INTRAVENOUS | Status: AC

## 2019-07-14 MED ORDER — SODIUM CHLORIDE 0.9 % IV BOLUS
500.0000 mL | Freq: Once | INTRAVENOUS | Status: AC
  Administered 2019-07-14: 500 mL via INTRAVENOUS

## 2019-07-14 NOTE — Progress Notes (Signed)
PROGRESS NOTE  Pamela Vasquez VWU:[id number] DOB: 01-Apr-1951 DOA: 07/12/2019 PCP: Patient, No Pcp Per  Brief History:  69 year old female with a history of fibromyalgia, depression, hypertension presenting with a mechanical fall off her commode.  The patient is a poor and difficult historian.  Much of this history is obtained from review of medical record and speaking with the patient's son.  Apparently, the patient fell off the commode and because of pain and weakness she was not able to get up off the floor.  She is unable to tell me if she has had a period of generalized weakness or focal weakness prior to her fall.  Regardless, her family tried to reach her by phone and was unable to speak with her.  As result, her son came to check up on her and found her on the floor.  According to the patient's son, the patient is able to ambulate and drive and perform her activities of daily living.  Her son relates a very long history of depression and malingering type symptoms with regard to the patient.  In fact, the patient's son relates a long history of attention seeking behavior with regard to the patient.  He states that the patient is usually noncompliant with medical care.  He states that up until 2 days ago, she had not had any difficulty driving and getting around.  Aside from some dental pain, the patient has not had any recent complaints.  She denies any headache, chest pain, shortness breath, abdominal pain, nausea, vomiting, diarrhea. In the emergency department, the patient was afebrile hemodynamically stable with oxygen saturation 96% on room air.  Her blood pressure was 209/121.  BMP, LFTs, and CT of the brain were unremarkable.  WBC 17.2, hemoglobin 16.3, platelets 249,000.  X-rays of the lumbar, thoracic spine were negative.  X-ray of the pelvis was negative.  CPK was 1204.   Assessment/Plan: Acute R-ACA Ischemic Stroke -MRI brain -Serum B12--220 -Folic  acid--12.2 -TSH--0.236 -Free T4--0.91 -07/12/2019 CT brain negative -Urine drug screen--neg -Appreciate Neurology Consult -PT/OT evaluation-->SNF -Speech therapy eval -MRI brain--moderately large R-ACA acute infarct -MRA brain--no LVO; bilateral severe PCA stenosis -Carotid Duplex--no hemodynamically significant stenosis -Echo--EF 70-75%, G1DD, no PFO -LDL--233 -HbA1C--5.5 -Antiplatelet--ASA+Plavix  Leukocytosis -Suspect stress demargination -1/29 CXR personally reviewed--no infiltrates -UA--no pyuria  Rhabdomyolysis -CPK 1204 at the time of presentation -Continue IV fluids -Repeat [id number]  Hypokalemia -Replete -Check magnesium--2.1  Hypertensive urgency -allowed permissive HTN initially -start low dose metoprolol  Hyperglycemia -Hemoglobin A1c--5.5  Tachycardia -likely stress induced -thyroid functions as above -echo as above -d-dimer -low dose metoprolol     Disposition Plan:   SNF 2/1 when bed available Family Communication:   Son updated 1/30  Consultants:  none  Code Status:  FULL  DVT Prophylaxis:  [place] Lovenox   Procedures: As Listed in Progress Note Above  Antibiotics: None  Total time spent 35 minutes.  Greater than 50% spent face to face counseling and coordinating care.     Subjective: Patient denies fevers, chills, headache, chest pain, dyspnea, nausea, vomiting, diarrhea, abdominal pain, dysuria, hematuria, hematochezia, and melena.   Objective: Vitals:   07/13/19 2107 07/14/19 0500 07/14/19 0539 07/14/19 1401  BP: (!) 165/102  (!) 160/87 (!) 177/110  Pulse: (!) 110  (!) 102 (!) 109  Resp: Temp: 98.6 F (37 C)  97.8 F (36.6 C) 99.1 F (37.3 C)  TempSrc: Oral   Oral  SpO2: 95%  95% 95%  Weight:  73.7 kg    Height:        Intake/Output Summary (Last 24 hours) at 07/14/2019 1410 Last data filed at 07/14/2019 1358 Gross per 24 hour  Intake 629.4 ml  Output 1200 ml  Net -570.6 ml    Weight change: 6.568 kg Exam:   General:  Pt is alert, follows commands appropriately, not in acute distress  HEENT: No icterus, No thrush, No neck mass, [place]/AT  Cardiovascular: RRR, S1/S2, no rubs, no gallops  Respiratory: bibasilar crackles, no wheeze  Abdomen: Soft/+BS, non tender, non distended, no guarding  Extremities: No edema, No lymphangitis, No petechiae, No rashes, no synovitis   Data Reviewed: I have personally reviewed following labs and imaging studies Basic Metabolic Panel: Recent Labs  Lab 07/12/19 1623 07/13/19 0412 07/13/19 0753 07/14/19 0507  NA 137 137  --  140  K 3.3* 3.6  --  3.6  CL 100 104  --  110  CO2 23 21*  --  20*  GLUCOSE 150* 131*  --  103*  BUN 14 13  --  14  CREATININE 0.92 0.86  --  0.81  CALCIUM 9.6 9.2  --  8.9  MG  --   --  2.0 2.1   Liver Function Tests: Recent Labs  Lab 07/12/19 1623 07/13/19 0412  AST 33 32  ALT 25 23  ALKPHOS 101 87  BILITOT 0.8 0.8  PROT 8.8* 7.8  ALBUMIN 4.3 3.8   No results for input(s): LIPASE, AMYLASE in the last 168 hours. No results for input(s): AMMONIA in the last 168 hours. Coagulation Profile: No results for input(s): INR, PROTIME in the last 168 hours. CBC: Recent Labs  Lab 07/12/19 1623 07/13/19 0412 07/14/19 0507  WBC 17.2* 16.0* 12.1*  HGB 16.3* 15.7* 13.8  HCT 47.9* 45.4 42.5  MCV 87.2 87.3 90.4  PLT 339 249 295   Cardiac Enzymes: Recent Labs  Lab 07/12/19 1623 07/12/19 2006 07/13/19 0412 07/14/19 0507  CKTOTAL 1,078* 1,204* 1,093*  1,095* 517*  CKMB  --   --  9.0*  --    BNP: Invalid input(s): POCBNP CBG: No results for input(s): GLUCAP in the last 168 hours. HbA1C: Recent Labs    07/13/19 0412  HGBA1C 5.5   Urine analysis:    Component Value Date/Time   COLORURINE YELLOW 07/13/2019 1122   APPEARANCEUR CLEAR 07/13/2019 1122   LABSPEC 1.015 07/13/2019 1122   PHURINE 7.0 07/13/2019 1122   GLUCOSEU NEGATIVE 07/13/2019 1122   HGBUR SMALL (A) 07/13/2019  1122   BILIRUBINUR NEGATIVE 07/13/2019 1122   KETONESUR NEGATIVE 07/13/2019 1122   PROTEINUR 30 (A) 07/13/2019 1122   UROBILINOGEN 0.2 06/28/2013 0918   NITRITE NEGATIVE 07/13/2019 1122   LEUKOCYTESUR NEGATIVE 07/13/2019 1122   Sepsis Labs: (procalcitonin:4,lacticidven:4) ) Recent Results (from the past 240 hour(s))  Respiratory Panel by RT PCR (Flu A&B, Covid) - Nasopharyngeal Swab     Status: None   Collection Time: 07/12/19  9:27 PM   Specimen: Nasopharyngeal Swab  Result Value Ref Range Status   SARS Coronavirus 2 by RT PCR NEGATIVE NEGATIVE Final    Comment: (NOTE) SARS-CoV-2 target nucleic acids are NOT DETECTED. The SARS-CoV-2 RNA is generally detectable in upper respiratoy specimens during the acute phase of infection. The lowest concentration of SARS-CoV-2 viral copies this assay can detect is 131 copies/mL. A negative result does not preclude SARS-Cov-2 infection and should not be used as the sole basis for treatment  or other patient management decisions. A negative result may occur with  improper specimen collection/handling, submission of specimen other than nasopharyngeal swab, presence of viral mutation(s) within the areas targeted by this assay, and inadequate number of viral copies (<131 copies/mL). A negative result must be combined with clinical observations, patient history, and epidemiological information. The expected result is Negative. Fact Sheet for Patients:  https://www.moore.com/ Fact Sheet for Healthcare Providers:  https://www.young.biz/ This test is not yet ap proved or cleared by the [place] FDA and  has been authorized for detection and/or diagnosis of SARS-CoV-2 by FDA under an Emergency Use Authorization (EUA). This EUA will remain  in effect (meaning this test can be used) for the duration of the COVID-19 declaration under Section 564(b)(1) of the Act, 21 U.S.C. section 360bbb-3(b)(1),  unless the authorization is terminated or revoked sooner.    Influenza A by PCR NEGATIVE NEGATIVE Final   Influenza B by PCR NEGATIVE NEGATIVE Final    Comment: (NOTE) The Xpert Xpress SARS-CoV-2/FLU/RSV assay is intended as an aid in  the diagnosis of influenza from Nasopharyngeal swab specimens and  should not be used as a sole basis for treatment. Nasal washings and  aspirates are unacceptable for Xpert Xpress SARS-CoV-2/FLU/RSV  testing. Fact Sheet for Patients: https://www.moore.com/ Fact Sheet for Healthcare Providers: https://www.young.biz/ This test is not yet approved or cleared by the [place] FDA and  has been authorized for detection and/or diagnosis of SARS-CoV-2 by  FDA under an Emergency Use Authorization (EUA). This EUA will remain  in effect (meaning this test can be used) for the duration of the  Covid-19 declaration under Section 564(b)(1) of the Act, 21  U.S.C. section 360bbb-3(b)(1), unless the authorization is  terminated or revoked. Performed at [facility], [address], [place], [place] [address]   Culture, Urine     Status: None   Collection Time: 07/13/19 11:22 AM   Specimen: Urine, Clean Catch  Result Value Ref Range Status   Specimen Description   Final    URINE, CLEAN CATCH Performed at [facility], [address], [place], [place] [address]    Special Requests   Final    NONE Performed at [facility], [address], [place], [place] [address]    Culture   Final    NO GROWTH Performed at [facility], [address], [place], [place] [address]    Report Status 07/14/2019 FINAL  Final     Scheduled Meds: . aspirin EC  325 mg Oral Daily  . atorvastatin  40 mg Oral q1800  . clopidogrel  75 mg Oral Q breakfast  . cyanocobalamin  1,000 mcg Intramuscular Daily  . enoxaparin (LOVENOX) injection  40 mg Subcutaneous Q24H   Continuous Infusions: . 0.9 % NaCl with KCl 20 mEq / L 125 mL/hr at  07/14/19 9767    Procedures/Studies: DG Thoracic Spine 2 View  Result Date: 07/12/2019 CLINICAL DATA:  Fall with back pain EXAM: THORACIC SPINE 2 VIEWS COMPARISON:  Chest radiograph dated 07/23/2015 FINDINGS: There is no evidence of thoracic spine fracture. Alignment is normal. Mild degenerative changes are seen in the thoracic spine. IMPRESSION: No evidence of acute fracture or subluxation. Electronically Signed   By: Romona Curls M.D.   On: 07/12/2019 19:32   DG Lumbar Spine Complete  Result Date: 07/12/2019 CLINICAL DATA:  Fall with back pain EXAM: LUMBAR SPINE - COMPLETE 4+ VIEW COMPARISON:  None. FINDINGS: There is no evidence of lumbar spine fracture. Alignment is  normal. Mild degenerative disc and joint disease is seen in the lumbar spine. IMPRESSION: No evidence of acute fracture or subluxation. Electronically Signed   By: Romona Curls M.D.   On: 07/12/2019 19:30   DG Pelvis 1-2 Views  Result Date: 07/12/2019 CLINICAL DATA:  Fall with pain. EXAM: PELVIS - 1-2 VIEW COMPARISON:  None. FINDINGS: There is no evidence of pelvic fracture or diastasis. No pelvic bone lesions are seen. Mild degenerative changes are seen in the lumbar spine. IMPRESSION: No acute osseous injury. Electronically Signed   By: Romona Curls M.D.   On: 07/12/2019 19:32   CT HEAD WO CONTRAST  Result Date: 07/12/2019 CLINICAL DATA:  Ataxia left-sided weakness fall EXAM: CT HEAD WITHOUT CONTRAST TECHNIQUE: Contiguous axial images were obtained from the base of the skull through the vertex without intravenous contrast. COMPARISON:  CT brain 01/26/2018 FINDINGS: Brain: No acute territorial infarction, hemorrhage, or intracranial mass is visualized. Patchy hypodensity in the white matter consistent with chronic small vessel ischemic change. CSF cystic density at the left basal ganglia without change, possible chronic infarct versus choroidal fissure cyst, this is without change. Similar enlargement of the ventricular system.  Vascular: No hyperdense vessels.  Carotid vascular calcification Skull: Normal. Negative for fracture or focal lesion. Sinuses/Orbits: Patchy mucosal thickening in the ethmoid sinuses Other: None IMPRESSION: 1. No CT evidence for acute intracranial abnormality. 2. Mild chronic small vessel ischemic change of the white matter. Electronically Signed   By: Jasmine Pang M.D.   On: 07/12/2019 23:23   MR ANGIO HEAD WO CONTRAST  Result Date: 07/13/2019 CLINICAL DATA:  Ataxia, stroke suspected. Left-sided weakness. Recent fall. EXAM: MRI HEAD WITHOUT CONTRAST MRA HEAD WITHOUT CONTRAST TECHNIQUE: Multiplanar, multiecho pulse sequences of the brain and surrounding structures were obtained without intravenous contrast. Angiographic images of the head were obtained using MRA technique without contrast. COMPARISON:  Head CT 07/12/2019 FINDINGS: MRI HEAD FINDINGS The study is mildly motion degraded. Brain: There is a moderately large acute distal right ACA infarct involving the parasagittal frontal and parietal lobes, cingulate gyrus, and body of the corpus callosum. There is no associated hemorrhage. T2 hyperintensities elsewhere in the cerebral white matter bilaterally are nonspecific but compatible with mild chronic small vessel ischemic disease. A 1 cm T2 hyperintensity inferiorly in the left basal ganglia/subinsular region was present on a 2013 head CT and may reflect a chronic lacunar infarct versus a dilated perivascular space or developmental cyst, with mild surrounding gliosis potentially favoring an old infarct. Ventriculomegaly is favored to reflect moderately advanced central predominant cerebral atrophy over hydrocephalus. A few chronic microhemorrhages are noted in the cerebellum bilaterally likely related to chronic hypertension. There is also a single chronic microhemorrhage in the medial left frontoparietal region. Vascular: Major intracranial vascular flow voids are preserved. Skull and upper cervical  spine: No suspicious marrow lesion. Sinuses/Orbits: Paranasal sinuses and mastoid air cells are clear. Unremarkable orbits. Other: None. MRA HEAD FINDINGS The study is motion degraded including moderately severe motion through the ACA and MCA branches. The visualized distal vertebral arteries are patent to the basilar and codominant. Patent left PICA, right AICA, and bilateral SCA origins are visualized. The basilar artery is patent with mild stenosis versus artifact in its midportion. The PCAs are patent proximally with regions of signal loss involving the distal P1 and right greater than left P2 segments bilaterally suggesting severe stenoses although this may also be in part artifactual. The internal carotid arteries are widely patent from skull base to carotid termini.  A1 and M1 segments are patent without evidence of flow limiting stenosis. No proximal branch occlusion is evident in the anterior circulation although branch vessel assessment is limited by motion. No aneurysm is identified. IMPRESSION: 1. Moderately large acute distal right ACA infarct. 2. Mild chronic small vessel ischemic disease. 3. Moderately advanced cerebral atrophy. 4. Motion degraded head MRA without evidence of large vessel occlusion or flow limiting proximal stenosis in the anterior circulation. 5. Potentially severe proximal PCA stenoses bilaterally. Electronically Signed   By: Logan Bores M.D.   On: 07/13/2019 10:56   MR BRAIN WO CONTRAST  Result Date: 07/13/2019 CLINICAL DATA:  Ataxia, stroke suspected. Left-sided weakness. Recent fall. EXAM: MRI HEAD WITHOUT CONTRAST MRA HEAD WITHOUT CONTRAST TECHNIQUE: Multiplanar, multiecho pulse sequences of the brain and surrounding structures were obtained without intravenous contrast. Angiographic images of the head were obtained using MRA technique without contrast. COMPARISON:  Head CT 07/12/2019 FINDINGS: MRI HEAD FINDINGS The study is mildly motion degraded. Brain: There is a  moderately large acute distal right ACA infarct involving the parasagittal frontal and parietal lobes, cingulate gyrus, and body of the corpus callosum. There is no associated hemorrhage. T2 hyperintensities elsewhere in the cerebral white matter bilaterally are nonspecific but compatible with mild chronic small vessel ischemic disease. A 1 cm T2 hyperintensity inferiorly in the left basal ganglia/subinsular region was present on a 2013 head CT and may reflect a chronic lacunar infarct versus a dilated perivascular space or developmental cyst, with mild surrounding gliosis potentially favoring an old infarct. Ventriculomegaly is favored to reflect moderately advanced central predominant cerebral atrophy over hydrocephalus. A few chronic microhemorrhages are noted in the cerebellum bilaterally likely related to chronic hypertension. There is also a single chronic microhemorrhage in the medial left frontoparietal region. Vascular: Major intracranial vascular flow voids are preserved. Skull and upper cervical spine: No suspicious marrow lesion. Sinuses/Orbits: Paranasal sinuses and mastoid air cells are clear. Unremarkable orbits. Other: None. MRA HEAD FINDINGS The study is motion degraded including moderately severe motion through the ACA and MCA branches. The visualized distal vertebral arteries are patent to the basilar and codominant. Patent left PICA, right AICA, and bilateral SCA origins are visualized. The basilar artery is patent with mild stenosis versus artifact in its midportion. The PCAs are patent proximally with regions of signal loss involving the distal P1 and right greater than left P2 segments bilaterally suggesting severe stenoses although this may also be in part artifactual. The internal carotid arteries are widely patent from skull base to carotid termini. A1 and M1 segments are patent without evidence of flow limiting stenosis. No proximal branch occlusion is evident in the anterior circulation  although branch vessel assessment is limited by motion. No aneurysm is identified. IMPRESSION: 1. Moderately large acute distal right ACA infarct. 2. Mild chronic small vessel ischemic disease. 3. Moderately advanced cerebral atrophy. 4. Motion degraded head MRA without evidence of large vessel occlusion or flow limiting proximal stenosis in the anterior circulation. 5. Potentially severe proximal PCA stenoses bilaterally. Electronically Signed   By: Logan Bores M.D.   On: 07/13/2019 10:56   US Carotid Bilateral  Result Date: 07/13/2019 CLINICAL DATA:  69 year old female with a history of acute stroke EXAM: BILATERAL CAROTID DUPLEX ULTRASOUND TECHNIQUE: Pearline Cables scale imaging, color Doppler and duplex ultrasound were performed of bilateral carotid and vertebral arteries in the neck. COMPARISON:  None. FINDINGS: Criteria: Quantification of carotid stenosis is based on velocity parameters that correlate the residual internal carotid diameter with NASCET-based stenosis  levels, using the diameter of the distal internal carotid lumen as the denominator for stenosis measurement. The following velocity measurements were obtained: RIGHT ICA:  Systolic 81 cm/sec, Diastolic 12 cm/sec CCA:  161 cm/sec SYSTOLIC ICA/CCA RATIO:  0.5 ECA:  109 cm/sec LEFT ICA:  Systolic 112 cm/sec, Diastolic 18 cm/sec CCA:  134 cm/sec SYSTOLIC ICA/CCA RATIO:  0.8 ECA:  128 cm/sec Right Brachial SBP: Not acquired Left Brachial SBP: Not acquired RIGHT CAROTID ARTERY: No significant calcified disease of the right common carotid artery. Intermediate waveform maintained. Heterogeneous plaque without significant calcifications at the right carotid bifurcation. Low resistance waveform of the right ICA. No significant tortuosity. RIGHT VERTEBRAL ARTERY: Antegrade flow with low resistance waveform. LEFT CAROTID ARTERY: No significant calcified disease of the left common carotid artery. Intermediate waveform maintained. Heterogeneous plaque at the left  carotid bifurcation without significant calcifications. Low resistance waveform of the left ICA. LEFT VERTEBRAL ARTERY:  Antegrade flow with low resistance waveform. IMPRESSION: Color duplex indicates minimal heterogeneous plaque, with no hemodynamically significant stenosis by duplex criteria in the extracranial cerebrovascular circulation. Signed, Yvone Neu. Reyne Dumas, RPVI [facility] Electronically Signed   By: Gilmer Mor D.O.   On: 07/13/2019 15:09   DG CHEST PORT 1 VIEW  Result Date: 07/13/2019 CLINICAL DATA:  Shortness of breath.  Confusion. EXAM: PORTABLE CHEST 1 VIEW COMPARISON:  July 23, 2015 FINDINGS: Lungs are clear. Heart size and pulmonary vascularity are within normal limits. No adenopathy. No bone lesions. IMPRESSION: Lungs clear.  Cardiac silhouette normal.  No adenopathy. Electronically Signed   By: Bretta Bang III M.D.   On: 07/13/2019 08:22   ECHOCARDIOGRAM COMPLETE  Result Date: 07/13/2019   ECHOCARDIOGRAM REPORT   Patient Name:   LINDIA GARMS Date of Exam: 07/13/2019 Medical Rec #:  [id number]       Height:       64.0 in Accession #:    [id number]      Weight:       164.2 lb Date of Birth:  Aug 18, 1950      BSA:          1.80 m Patient Age:    68 years        BP:           164/92 mmHg Patient Gender: F               HR:           106 bpm. [facility] Procedure: 2D Echo, Cardiac Doppler and Color Doppler Indications:    Stroke 434.91 / I163.9  History:        Patient has prior history of Echocardiogram examinations, most                 recent 03/06/2013. Risk Factors:Hypertension and Dyslipidemia.                 Acute ischemic stroke,Left hemiparesis.  Sonographer:    Celesta Gentile RCS Referring Phys: [phone] Magdalynn Davilla  Sonographer Comments: NOTE: Patient has no I.V. access at this time. Patient pulled out prior to my arrival. IMPRESSIONS  1. Left ventricular ejection fraction, by visual estimation, is 70  to 75%. The left ventricle has hyperdynamic function. There is mildly increased left ventricular hypertrophy.  2. Left ventricular diastolic parameters are consistent with Grade I diastolic dysfunction (impaired relaxation).  3. The left ventricle has no regional wall motion abnormalities.  4. Global right ventricle has  hyperdynamic systolic function.The right ventricular size is normal. Right vetricular wall thickness was not assessed.  5. Left atrial size was normal.  6. Right atrial size was normal.  7. Moderate mitral annular calcification.  8. The mitral valve is grossly normal. No evidence of mitral valve regurgitation.  9. The tricuspid valve is not well visualized. 10. The tricuspid valve is not well visualized. Tricuspid valve regurgitation is not demonstrated. 11. The aortic valve was not well visualized. Aortic valve regurgitation is not visualized. No evidence of aortic valve sclerosis or stenosis. 12. The pulmonic valve was not well visualized. Pulmonic valve regurgitation is not visualized. FINDINGS  Left Ventricle: Left ventricular ejection fraction, by visual estimation, is 70 to 75%. The left ventricle has hyperdynamic function. The left ventricle has no regional wall motion abnormalities. The left ventricular internal cavity size was the left ventricle is normal in size. There is mildly increased left ventricular hypertrophy. Concentric left ventricular hypertrophy. Left ventricular diastolic parameters are consistent with Grade I diastolic dysfunction (impaired relaxation). Normal left atrial pressure. Right Ventricle: The right ventricular size is normal. Right vetricular wall thickness was not assessed. Global RV systolic function is has hyperdynamic systolic function. Left Atrium: Left atrial size was normal in size. Right Atrium: Right atrial size was normal in size Pericardium: There is no evidence of pericardial effusion. Mitral Valve: The mitral valve is grossly normal. Moderate mitral  annular calcification. No evidence of mitral valve regurgitation. Tricuspid Valve: The tricuspid valve is not well visualized. Tricuspid valve regurgitation is not demonstrated. Aortic Valve: The aortic valve was not well visualized. Aortic valve regurgitation is not visualized. The aortic valve is structurally normal, with no evidence of sclerosis or stenosis. Mild to moderate aortic valve annular calcification. Pulmonic Valve: The pulmonic valve was not well visualized. Pulmonic valve regurgitation is not visualized. Pulmonic regurgitation is not visualized. Aorta: The aortic root is normal in size and structure. Venous: The inferior vena cava was not well visualized. IAS/Shunts: No atrial level shunt detected by color flow Doppler.  LEFT VENTRICLE PLAX 2D LVIDd:         3.69 cm  Diastology LVIDs:         1.91 cm  LV e' lateral:   8.16 cm/s LV PW:         1.13 cm  LV E/e' lateral: 8.0 LV IVS:        1.13 cm  LV e' medial:    8.16 cm/s LVOT diam:     1.70 cm  LV E/e' medial:  8.0 LV SV:         46 ml LV SV Index:   25.03 LVOT Area:     2.27 cm  RIGHT VENTRICLE RV S prime:     17.60 cm/s TAPSE (M-mode): 1.5 cm LEFT ATRIUM           Index       RIGHT ATRIUM          Index LA diam:      2.50 cm 1.39 cm/m  RA Area:     8.30 cm LA Vol (A2C): 13.1 ml 7.28 ml/m  RA Volume:   13.50 ml 7.50 ml/m LA Vol (A4C): 18.4 ml 10.23 ml/m  AORTIC VALVE LVOT Vmax:   137.00 cm/s LVOT Vmean:  96.000 cm/s LVOT VTI:    0.229 m  AORTA Ao Root diam: 3.00 cm MITRAL VALVE MV Area (PHT): 3.17 cm  SHUNTS MV PHT:        69.31 msec            Systemic VTI:  0.23 m MV Decel Time: 239 msec              Systemic Diam: 1.70 cm MV E velocity: 65.60 cm/s  103 cm/s MV A velocity: 114.00 cm/s 70.3 cm/s MV E/A ratio:  0.58        1.5  Prentice Docker MD Electronically signed by Prentice Docker MD Signature Date/Time: 07/13/2019/4:28:04 PM    Final     Catarina Hartshorn, DO  [facility] Pager [phone]  If 7PM-7AM, please  contact night-coverage [email or web address] Password [email or web address] 07/14/2019, 2:10 PM   LOS: 1 day

## 2019-07-14 NOTE — TOC Initial Note (Signed)
Transition of Care [facility]) - Initial/Assessment Note    Patient Details  Name: PRAISE STENNETT MRN: [id number] Date of Birth: [birth year]  Transition of Care [facility]) CM/SW Contact:    Dom Haverland, Chrystine Oiler, RN Phone Number: 07/14/2019, 2:02 PM  Clinical Narrative:     Acute Right ACA stroke. From home, agreeable to SNF. Referred.               Expected Discharge Plan: Skilled Nursing Facility Barriers to Discharge: Continued Medical Work up   Patient Goals and CMS Choice Patient states their goals for this hospitalization and ongoing recovery are:: go to SNF and return home      Expected Discharge Plan and Services Expected Discharge Plan: Skilled Nursing Facility       Living arrangements for the past 2 months: Single Family Home                                      Prior Living Arrangements/Services Living arrangements for the past 2 months: Single Family Home Lives with:: Self                   Activities of Daily Living Home Assistive Devices/Equipment: None ADL Screening (condition at time of admission) Patient's cognitive ability adequate to safely complete daily activities?: Yes Is the patient deaf or have difficulty hearing?: No Does the patient have difficulty seeing, even when wearing glasses/contacts?: No Does the patient have difficulty concentrating, remembering, or making decisions?: No Patient able to express need for assistance with ADLs?: Yes Does the patient have difficulty dressing or bathing?: Yes Independently performs ADLs?: No Communication: Independent Dressing (OT): Dependent Is this a change from baseline?: Pre-admission baseline Grooming: Dependent Is this a change from baseline?: Pre-admission baseline Feeding: Dependent Is this a change from baseline?: Pre-admission baseline Bathing: Dependent Is this a change from baseline?: Pre-admission baseline Toileting: Dependent Is this a change from baseline?: Pre-admission  baseline In/Out Bed: Dependent Is this a change from baseline?: Pre-admission baseline Walks in Home: Dependent Is this a change from baseline?: Pre-admission baseline Does the patient have difficulty walking or climbing stairs?: Yes Weakness of Legs: Left Weakness of Arms/Hands: Left  Permission Sought/Granted                  Emotional Assessment              Admission diagnosis:  Rhabdomyolysis [M62.82] Traumatic rhabdomyolysis, initial encounter (HCC) [T79.6XXA] Acute ischemic stroke [facility]) [I63.9] Patient Active Problem List   Diagnosis Date Noted  . Left hemiparesis (HCC) 07/13/2019  . Acute ischemic stroke (HCC) 07/13/2019  . Rhabdomyolysis 07/12/2019  . Hypokalemia 07/12/2019  . Stroke (HCC) 07/12/2019  . Chest pain 03/06/2013  . Boil of buttock 02/11/2012  . Heart palpitations 12/08/2011  . Oral candidiasis 12/08/2011  . Vaginitis 12/08/2011  . Hyperlipidemia 12/08/2011  . Chronic cough 07/07/2011  . Chronic fatigue 02/08/2011  . Blepharitis 02/08/2011  . Overweight(278.02) 02/05/2011  . WEAKNESS 01/24/2007  . ANXIETY 08/22/2006  . DEPRESSION 08/22/2006  . MIGRAINE HEADACHE 08/22/2006  . PERIPHERAL NEUROPATHY 08/22/2006  . Hypertension 08/22/2006  . GERD 08/22/2006  . CONSTIPATION NOS 08/22/2006  . IBS 08/22/2006  . OVERACTIVE BLADDER 08/22/2006  . OSTEOARTHRITIS 08/22/2006  . LOW BACK PAIN 08/22/2006  . FIBROMYALGIA 08/22/2006  . COLONIC POLYPS, HX OF 08/22/2006   PCP:  Patient, No Pcp Per Pharmacy:   [facility] - [place],  [place] - [address] [place] [address] Phone: [phone] Fax: [phone]     Social Determinants of Health (SDOH) Interventions    Readmission Risk Interventions No flowsheet data found.

## 2019-07-15 ENCOUNTER — Inpatient Hospital Stay (HOSPITAL_COMMUNITY): Payer: Medicare Other

## 2019-07-15 DIAGNOSIS — M6282 Rhabdomyolysis: Secondary | ICD-10-CM

## 2019-07-15 LAB — MAGNESIUM: Magnesium: 2 mg/dL (ref 1.7–2.4)

## 2019-07-15 LAB — CBC
HCT: 42.9 % (ref 36.0–46.0)
Hemoglobin: 14.1 g/dL (ref 12.0–15.0)
MCH: 29.7 pg (ref 26.0–34.0)
MCHC: 32.9 g/dL (ref 30.0–36.0)
MCV: 90.3 fL (ref 80.0–100.0)
Platelets: 294 10*3/uL (ref 150–400)
RBC: 4.75 MIL/uL (ref 3.87–5.11)
RDW: 14.7 % (ref 11.5–15.5)
WBC: 11 10*3/uL — ABNORMAL HIGH (ref 4.0–10.5)
nRBC: 0 % (ref 0.0–0.2)

## 2019-07-15 LAB — BASIC METABOLIC PANEL
Anion gap: 10 (ref 5–15)
BUN: 16 mg/dL (ref 8–23)
CO2: 21 mmol/L — ABNORMAL LOW (ref 22–32)
Calcium: 8.9 mg/dL (ref 8.9–10.3)
Chloride: 109 mmol/L (ref 98–111)
Creatinine, Ser: 0.78 mg/dL (ref 0.44–1.00)
GFR calc Af Amer: >60 mL/min (ref >60)
GFR calc non Af Amer: >60 mL/min (ref >60)
Glucose, Bld: 92 mg/dL (ref 70–99)
Potassium: 3.7 mmol/L (ref 3.5–5.1)
Sodium: 140 mmol/L (ref 135–145)

## 2019-07-15 LAB — CK: Total CK: 208 U/L (ref 38–234)

## 2019-07-15 MED ORDER — METOPROLOL TARTRATE 5 MG/5ML IV SOLN
2.5000 mg | Freq: Once | INTRAVENOUS | Status: AC
  Administered 2019-07-15: 2.5 mg via INTRAVENOUS
  Filled 2019-07-15: qty 5

## 2019-07-15 MED ORDER — METOPROLOL TARTRATE 25 MG PO TABS
12.5000 mg | ORAL_TABLET | Freq: Once | ORAL | Status: AC
  Administered 2019-07-15: 12.5 mg via ORAL
  Filled 2019-07-15: qty 1

## 2019-07-15 MED ORDER — IOHEXOL 350 MG/ML SOLN
100.0000 mL | Freq: Once | INTRAVENOUS | Status: AC | PRN
  Administered 2019-07-15: 100 mL via INTRAVENOUS

## 2019-07-15 MED ORDER — METOPROLOL TARTRATE 50 MG PO TABS: 25.0000 mg | ORAL_TABLET | Freq: Two times a day (BID) | ORAL | Status: DC

## 2019-07-15 MED ORDER — METOPROLOL TARTRATE 50 MG PO TABS
50.0000 mg | ORAL_TABLET | Freq: Two times a day (BID) | ORAL | Status: DC
  Administered 2019-07-15 – 2019-07-17 (×4): 50 mg via ORAL
  Filled 2019-07-15 (×4): qty 1

## 2019-07-15 MED ORDER — AMOXICILLIN-POT CLAVULANATE 875-125 MG PO TABS
1.0000 | ORAL_TABLET | Freq: Two times a day (BID) | ORAL | Status: DC
  Administered 2019-07-15 – 2019-07-17 (×5): 1 via ORAL
  Filled 2019-07-15 (×5): qty 1

## 2019-07-15 NOTE — Progress Notes (Signed)
Received report @ 2100 of BP 216/118, I manually rechecked and BP 196/114. BP has been trending high r/d to ischemic stroke. Pt LOC reduced, responding to voice, following some commands, states, she "is just tired". Responses severely delayed. She denies any pain or discomfort. Other VSS. MD notified. Ordered to keep monitoring patient, perform Q4 hr neuro checks and informed to report if there are any changes. Will continue to monitor patient.

## 2019-07-15 NOTE — Progress Notes (Signed)
PROGRESS NOTE  Pamela Vasquez GQQ:[id number] DOB: Oct 13, 1950 DOA: 07/12/2019 PCP: Patient, No Pcp Per  Brief History: 69 year old female with a history of fibromyalgia, depression, hypertension presenting with a mechanical fall off her commode. The patient is a poor and difficult historian. Much of this history is obtained from review of medical record and speaking with the patient's son. Apparently, the patient fell off the commode and because of pain and weakness she was not able to get up off the floor. She is unable to tell me if she has had a period of generalized weakness or focal weakness prior to her fall. Regardless, her family tried to reach her by phone and was unable to speak with her. As result, her son came to check up on her and found her on the floor. According to the patient's son, the patient is able to ambulate and drive and perform her activities of daily living. Her son relates a very long history of depression and malingering type symptoms with regard to the patient. In fact, the patient's son relates a long history of attention seekingbehavior with regard to the patient. He states that the patient is usually noncompliant with medical care. He states that up until 2 days ago, she had not had any difficulty driving and getting around. Aside from some dental pain, the patient has not had any recent complaints. She denies any headache, chest pain, shortness breath, abdominal pain, nausea, vomiting, diarrhea. In the emergency department, the patient was afebrile hemodynamically stable with oxygen saturation 96% on room air. Her blood pressure was 209/121. BMP, LFTs, and CT of the brain were unremarkable. WBC 17.2, hemoglobin 16.3, platelets 249,000. X-rays of the lumbar, thoracic spine were negative. X-ray of the pelvis was negative. CPK was 1204.   Assessment/Plan: Acute R-ACA Ischemic Stroke -she has left side neglect and dysphasia (slow  responses)--currently at baseline -Serum B12--220 -Folic acid--12.2 -TSH--0.236 -Free T4--0.91 -07/12/2019 CT brain negative -Urine drug screen--neg -Appreciate Neurology Consult -PT/OT evaluation-->SNF -Speech therapy eval -MRI brain--moderately large R-ACA acute infarct -MRA brain--no LVO; bilateral severe PCA stenosis -Carotid Duplex--no hemodynamically significant stenosis -Echo--EF 70-75%, G1DD, no PFO -LDL--233 -HbA1C--5.5 -Antiplatelet--ASA+Plavix  Leukocytosis -Suspect stress demargination -1/29 CXR personally reviewed--no infiltrates -UA--no pyuria  Rhabdomyolysis -CPK 1204 at the time of presentation -Continue IV fluids -Repeat [id number]>>200  Hypokalemia -Replete -Check magnesium--2.1  Hypertensive urgency -allowed permissive HTN initially -started low dose metoprolol--slowly increase dose-->25 mg bid  Hyperglycemia -Hemoglobin A1c--5.5  Tachycardia -likely stress induced -thyroid functions as above -echo as above -d-dimer--1.00 -CTA chest -low dose metoprolol  Dental caries -amox/clav x 1 week     Disposition Plan: SNF 2/1 when bed available Family Communication:Son updated 1/31at bedside  Consultants:none  Code Status: FULL  DVT Prophylaxis: [place] Lovenox   Procedures: As Listed in Progress Note Above  Antibiotics: None  Total time spent 35 minutes.  Greater than 50% spent face to face counseling and coordinating care.      Subjective: Patient denies fevers, chills, headache, chest pain, dyspnea, nausea, vomiting, diarrhea, abdominal pain, dysuria, hematuria, hematochezia, and melena.   Objective: Vitals:   07/14/19 2046 07/14/19 2104 07/15/19 0539 07/15/19 0801  BP: (!) 216/118 (!) 196/114 (!) 192/103 (!) 190/106  Pulse: (!) 108 90 89 (!) 102  Resp: 16  16 17   Temp: 98.9 F (37.2 C)  98.4 F (36.9 C)   TempSrc: Oral  Oral   SpO2: 96%  95%   Weight:  Height:        Intake/Output  Summary (Last 24 hours) at 07/15/2019 1138 Last data filed at 07/15/2019 1121 Gross per 24 hour  Intake 3773.56 ml  Output 3000 ml  Net 773.56 ml   Weight change:  Exam:   General:  Pt is alert, follows commands appropriately, not in acute distress  HEENT: No icterus, No thrush, No neck mass, [place]/AT  Cardiovascular: RRR, S1/S2, no rubs, no gallops  Respiratory: bibasilar crackles. No wheeze  Abdomen: Soft/+BS, non tender, non distended, no guarding  Extremities: No edema, No lymphangitis, No petechiae, No rashes, no synovitis   Data Reviewed: I have personally reviewed following labs and imaging studies Basic Metabolic Panel: Recent Labs  Lab 07/12/19 1623 07/13/19 0412 07/13/19 0753 07/14/19 0507 07/15/19 0543  NA 137 137  --  140 140  K 3.3* 3.6  --  3.6 3.7  CL 100 104  --  110 109  CO2 23 21*  --  20* 21*  GLUCOSE 150* 131*  --  103* 92  BUN 14 13  --  14 16  CREATININE 0.92 0.86  --  0.81 0.78  CALCIUM 9.6 9.2  --  8.9 8.9  MG  --   --  2.0 2.1 2.0   Liver Function Tests: Recent Labs  Lab 07/12/19 1623 07/13/19 0412  AST 33 32  ALT 25 23  ALKPHOS 101 87  BILITOT 0.8 0.8  PROT 8.8* 7.8  ALBUMIN 4.3 3.8   No results for input(s): LIPASE, AMYLASE in the last 168 hours. No results for input(s): AMMONIA in the last 168 hours. Coagulation Profile: No results for input(s): INR, PROTIME in the last 168 hours. CBC: Recent Labs  Lab 07/12/19 1623 07/13/19 0412 07/14/19 0507 07/15/19 0543  WBC 17.2* 16.0* 12.1* 11.0*  HGB 16.3* 15.7* 13.8 14.1  HCT 47.9* 45.4 42.5 42.9  MCV 87.2 87.3 90.4 90.3  PLT 339 249 295 294   Cardiac Enzymes: Recent Labs  Lab 07/12/19 1623 07/12/19 2006 07/13/19 0412 07/14/19 0507 07/15/19 0543  CKTOTAL 1,078* 1,204* 1,093*  1,095* 517* 208  CKMB  --   --  9.0*  --   --    BNP: Invalid input(s): POCBNP CBG: No results for input(s): GLUCAP in the last 168 hours. HbA1C: Recent Labs    07/13/19 0412  HGBA1C 5.5     Urine analysis:    Component Value Date/Time   COLORURINE YELLOW 07/13/2019 1122   APPEARANCEUR CLEAR 07/13/2019 1122   LABSPEC 1.015 07/13/2019 1122   PHURINE 7.0 07/13/2019 1122   GLUCOSEU NEGATIVE 07/13/2019 1122   HGBUR SMALL (A) 07/13/2019 1122   BILIRUBINUR NEGATIVE 07/13/2019 1122   KETONESUR NEGATIVE 07/13/2019 1122   PROTEINUR 30 (A) 07/13/2019 1122   UROBILINOGEN 0.2 06/28/2013 0918   NITRITE NEGATIVE 07/13/2019 1122   LEUKOCYTESUR NEGATIVE 07/13/2019 1122   Sepsis Labs: (procalcitonin:4,lacticidven:4) ) Recent Results (from the past 240 hour(s))  Respiratory Panel by RT PCR (Flu A&B, Covid) - Nasopharyngeal Swab     Status: None   Collection Time: 07/12/19  9:27 PM   Specimen: Nasopharyngeal Swab  Result Value Ref Range Status   SARS Coronavirus 2 by RT PCR NEGATIVE NEGATIVE Final    Comment: (NOTE) SARS-CoV-2 target nucleic acids are NOT DETECTED. The SARS-CoV-2 RNA is generally detectable in upper respiratoy specimens during the acute phase of infection. The lowest concentration of SARS-CoV-2 viral copies this assay can detect is 131 copies/mL. A negative result does not preclude SARS-Cov-2 infection  and should not be used as the sole basis for treatment or other patient management decisions. A negative result may occur with  improper specimen collection/handling, submission of specimen other than nasopharyngeal swab, presence of viral mutation(s) within the areas targeted by this assay, and inadequate number of viral copies (<131 copies/mL). A negative result must be combined with clinical observations, patient history, and epidemiological information. The expected result is Negative. Fact Sheet for Patients:  https://www.moore.com/ Fact Sheet for Healthcare Providers:  https://www.young.biz/ This test is not yet ap proved or cleared by the [place] FDA and  has been authorized for detection and/or  diagnosis of SARS-CoV-2 by FDA under an Emergency Use Authorization (EUA). This EUA will remain  in effect (meaning this test can be used) for the duration of the COVID-19 declaration under Section 564(b)(1) of the Act, 21 U.S.C. section 360bbb-3(b)(1), unless the authorization is terminated or revoked sooner.    Influenza A by PCR NEGATIVE NEGATIVE Final   Influenza B by PCR NEGATIVE NEGATIVE Final    Comment: (NOTE) The Xpert Xpress SARS-CoV-2/FLU/RSV assay is intended as an aid in  the diagnosis of influenza from Nasopharyngeal swab specimens and  should not be used as a sole basis for treatment. Nasal washings and  aspirates are unacceptable for Xpert Xpress SARS-CoV-2/FLU/RSV  testing. Fact Sheet for Patients: https://www.moore.com/ Fact Sheet for Healthcare Providers: https://www.young.biz/ This test is not yet approved or cleared by the [place] FDA and  has been authorized for detection and/or diagnosis of SARS-CoV-2 by  FDA under an Emergency Use Authorization (EUA). This EUA will remain  in effect (meaning this test can be used) for the duration of the  Covid-19 declaration under Section 564(b)(1) of the Act, 21  U.S.C. section 360bbb-3(b)(1), unless the authorization is  terminated or revoked. Performed at [facility], [address], [place], [place] [address]   Culture, Urine     Status: None   Collection Time: 07/13/19 11:22 AM   Specimen: Urine, Clean Catch  Result Value Ref Range Status   Specimen Description   Final    URINE, CLEAN CATCH Performed at [facility], [address], [place], [place] [address]    Special Requests   Final    NONE Performed at [facility], [address] [place], [place] [address]    Culture   Final    NO GROWTH Performed at [facility], [address], [place], [place] [address]    Report Status 07/14/2019 FINAL  Final     Scheduled Meds: . aspirin EC  325 mg Oral Daily  .  atorvastatin  40 mg Oral q1800  . clopidogrel  75 mg Oral Q breakfast  . enoxaparin (LOVENOX) injection  40 mg Subcutaneous Q24H  . metoprolol tartrate  12.5 mg Oral BID   Continuous Infusions: . 0.9 % NaCl with KCl 20 mEq / L 75 mL/hr at 07/15/19 7530    Procedures/Studies: DG Thoracic Spine 2 View  Result Date: 07/12/2019 CLINICAL DATA:  Fall with back pain EXAM: THORACIC SPINE 2 VIEWS COMPARISON:  Chest radiograph dated 07/23/2015 FINDINGS: There is no evidence of thoracic spine fracture. Alignment is normal. Mild degenerative changes are seen in the thoracic spine. IMPRESSION: No evidence of acute fracture or subluxation. Electronically Signed   By: Romona Curls M.D.   On: 07/12/2019 19:32   DG Lumbar Spine Complete  Result Date: 07/12/2019 CLINICAL DATA:  Fall with back pain EXAM: LUMBAR SPINE - COMPLETE 4+ VIEW COMPARISON:  None. FINDINGS: There is no evidence of lumbar spine fracture. Alignment is normal. Mild degenerative disc and joint disease is seen in the lumbar spine. IMPRESSION: No evidence of acute fracture or subluxation. Electronically Signed   By: Zerita Boers M.D.   On: 07/12/2019 19:30   DG Pelvis 1-2 Views  Result Date: 07/12/2019 CLINICAL DATA:  Fall with pain. EXAM: PELVIS - 1-2 VIEW COMPARISON:  None. FINDINGS: There is no evidence of pelvic fracture or diastasis. No pelvic bone lesions are seen. Mild degenerative changes are seen in the lumbar spine. IMPRESSION: No acute osseous injury. Electronically Signed   By: Zerita Boers M.D.   On: 07/12/2019 19:32   CT HEAD WO CONTRAST  Result Date: 07/12/2019 CLINICAL DATA:  Ataxia left-sided weakness fall EXAM: CT HEAD WITHOUT CONTRAST TECHNIQUE: Contiguous axial images were obtained from the base of the skull through the vertex without intravenous contrast. COMPARISON:  CT brain 01/26/2018 FINDINGS: Brain: No acute territorial infarction, hemorrhage, or intracranial mass is visualized. Patchy hypodensity in the white  matter consistent with chronic small vessel ischemic change. CSF cystic density at the left basal ganglia without change, possible chronic infarct versus choroidal fissure cyst, this is without change. Similar enlargement of the ventricular system. Vascular: No hyperdense vessels.  Carotid vascular calcification Skull: Normal. Negative for fracture or focal lesion. Sinuses/Orbits: Patchy mucosal thickening in the ethmoid sinuses Other: None IMPRESSION: 1. No CT evidence for acute intracranial abnormality. 2. Mild chronic small vessel ischemic change of the white matter. Electronically Signed   By: Donavan Foil M.D.   On: 07/12/2019 23:23   MR ANGIO HEAD WO CONTRAST  Result Date: 07/13/2019 CLINICAL DATA:  Ataxia, stroke suspected. Left-sided weakness. Recent fall. EXAM: MRI HEAD WITHOUT CONTRAST MRA HEAD WITHOUT CONTRAST TECHNIQUE: Multiplanar, multiecho pulse sequences of the brain and surrounding structures were obtained without intravenous contrast. Angiographic images of the head were obtained using MRA technique without contrast. COMPARISON:  Head CT 07/12/2019 FINDINGS: MRI HEAD FINDINGS The study is mildly motion degraded. Brain: There is a moderately large acute distal right ACA infarct involving the parasagittal frontal and parietal lobes, cingulate gyrus, and body of the corpus callosum. There is no associated hemorrhage. T2 hyperintensities elsewhere in the cerebral white matter bilaterally are nonspecific but compatible with mild chronic small vessel ischemic disease. A 1 cm T2 hyperintensity inferiorly in the left basal ganglia/subinsular region was present on a 2013 head CT and may reflect a chronic lacunar infarct versus a dilated perivascular space or developmental cyst, with mild surrounding gliosis potentially favoring an old infarct. Ventriculomegaly is favored to reflect moderately advanced central predominant cerebral atrophy over hydrocephalus. A few chronic microhemorrhages are noted in  the cerebellum bilaterally likely related to chronic hypertension. There is also a single chronic microhemorrhage in the medial left frontoparietal region. Vascular: Major intracranial vascular flow voids are preserved. Skull and upper cervical spine: No suspicious marrow lesion. Sinuses/Orbits: Paranasal sinuses and mastoid air cells are clear. Unremarkable orbits. Other: None. MRA HEAD FINDINGS The study is motion degraded including moderately severe motion through the ACA and MCA branches. The visualized distal vertebral arteries are patent to the basilar and codominant. Patent left PICA, right AICA, and bilateral SCA origins are visualized. The basilar artery is patent with mild stenosis versus artifact in its midportion. The PCAs are patent proximally with regions of signal loss involving the distal P1 and right greater than left P2 segments bilaterally suggesting severe stenoses although this may also be in part artifactual. The  internal carotid arteries are widely patent from skull base to carotid termini. A1 and M1 segments are patent without evidence of flow limiting stenosis. No proximal branch occlusion is evident in the anterior circulation although branch vessel assessment is limited by motion. No aneurysm is identified. IMPRESSION: 1. Moderately large acute distal right ACA infarct. 2. Mild chronic small vessel ischemic disease. 3. Moderately advanced cerebral atrophy. 4. Motion degraded head MRA without evidence of large vessel occlusion or flow limiting proximal stenosis in the anterior circulation. 5. Potentially severe proximal PCA stenoses bilaterally. Electronically Signed   By: Sebastian Ache M.D.   On: 07/13/2019 10:56   MR BRAIN WO CONTRAST  Result Date: 07/13/2019 CLINICAL DATA:  Ataxia, stroke suspected. Left-sided weakness. Recent fall. EXAM: MRI HEAD WITHOUT CONTRAST MRA HEAD WITHOUT CONTRAST TECHNIQUE: Multiplanar, multiecho pulse sequences of the brain and surrounding structures were  obtained without intravenous contrast. Angiographic images of the head were obtained using MRA technique without contrast. COMPARISON:  Head CT 07/12/2019 FINDINGS: MRI HEAD FINDINGS The study is mildly motion degraded. Brain: There is a moderately large acute distal right ACA infarct involving the parasagittal frontal and parietal lobes, cingulate gyrus, and body of the corpus callosum. There is no associated hemorrhage. T2 hyperintensities elsewhere in the cerebral white matter bilaterally are nonspecific but compatible with mild chronic small vessel ischemic disease. A 1 cm T2 hyperintensity inferiorly in the left basal ganglia/subinsular region was present on a 2013 head CT and may reflect a chronic lacunar infarct versus a dilated perivascular space or developmental cyst, with mild surrounding gliosis potentially favoring an old infarct. Ventriculomegaly is favored to reflect moderately advanced central predominant cerebral atrophy over hydrocephalus. A few chronic microhemorrhages are noted in the cerebellum bilaterally likely related to chronic hypertension. There is also a single chronic microhemorrhage in the medial left frontoparietal region. Vascular: Major intracranial vascular flow voids are preserved. Skull and upper cervical spine: No suspicious marrow lesion. Sinuses/Orbits: Paranasal sinuses and mastoid air cells are clear. Unremarkable orbits. Other: None. MRA HEAD FINDINGS The study is motion degraded including moderately severe motion through the ACA and MCA branches. The visualized distal vertebral arteries are patent to the basilar and codominant. Patent left PICA, right AICA, and bilateral SCA origins are visualized. The basilar artery is patent with mild stenosis versus artifact in its midportion. The PCAs are patent proximally with regions of signal loss involving the distal P1 and right greater than left P2 segments bilaterally suggesting severe stenoses although this may also be in part  artifactual. The internal carotid arteries are widely patent from skull base to carotid termini. A1 and M1 segments are patent without evidence of flow limiting stenosis. No proximal branch occlusion is evident in the anterior circulation although branch vessel assessment is limited by motion. No aneurysm is identified. IMPRESSION: 1. Moderately large acute distal right ACA infarct. 2. Mild chronic small vessel ischemic disease. 3. Moderately advanced cerebral atrophy. 4. Motion degraded head MRA without evidence of large vessel occlusion or flow limiting proximal stenosis in the anterior circulation. 5. Potentially severe proximal PCA stenoses bilaterally. Electronically Signed   By: Sebastian Ache M.D.   On: 07/13/2019 10:56   US Carotid Bilateral  Result Date: 07/13/2019 CLINICAL DATA:  69 year old female with a history of acute stroke EXAM: BILATERAL CAROTID DUPLEX ULTRASOUND TECHNIQUE: Wallace Cullens scale imaging, color Doppler and duplex ultrasound were performed of bilateral carotid and vertebral arteries in the neck. COMPARISON:  None. FINDINGS: Criteria: Quantification of carotid stenosis is based on  velocity parameters that correlate the residual internal carotid diameter with NASCET-based stenosis levels, using the diameter of the distal internal carotid lumen as the denominator for stenosis measurement. The following velocity measurements were obtained: RIGHT ICA:  Systolic 81 cm/sec, Diastolic 12 cm/sec CCA:  161 cm/sec SYSTOLIC ICA/CCA RATIO:  0.5 ECA:  109 cm/sec LEFT ICA:  Systolic 112 cm/sec, Diastolic 18 cm/sec CCA:  134 cm/sec SYSTOLIC ICA/CCA RATIO:  0.8 ECA:  128 cm/sec Right Brachial SBP: Not acquired Left Brachial SBP: Not acquired RIGHT CAROTID ARTERY: No significant calcified disease of the right common carotid artery. Intermediate waveform maintained. Heterogeneous plaque without significant calcifications at the right carotid bifurcation. Low resistance waveform of the right ICA. No significant  tortuosity. RIGHT VERTEBRAL ARTERY: Antegrade flow with low resistance waveform. LEFT CAROTID ARTERY: No significant calcified disease of the left common carotid artery. Intermediate waveform maintained. Heterogeneous plaque at the left carotid bifurcation without significant calcifications. Low resistance waveform of the left ICA. LEFT VERTEBRAL ARTERY:  Antegrade flow with low resistance waveform. IMPRESSION: Color duplex indicates minimal heterogeneous plaque, with no hemodynamically significant stenosis by duplex criteria in the extracranial cerebrovascular circulation. Signed, Yvone Neu. Reyne Dumas, RPVI [facility] Electronically Signed   By: Gilmer Mor D.O.   On: 07/13/2019 15:09   DG CHEST PORT 1 VIEW  Result Date: 07/13/2019 CLINICAL DATA:  Shortness of breath.  Confusion. EXAM: PORTABLE CHEST 1 VIEW COMPARISON:  July 23, 2015 FINDINGS: Lungs are clear. Heart size and pulmonary vascularity are within normal limits. No adenopathy. No bone lesions. IMPRESSION: Lungs clear.  Cardiac silhouette normal.  No adenopathy. Electronically Signed   By: Bretta Bang III M.D.   On: 07/13/2019 08:22   ECHOCARDIOGRAM COMPLETE  Result Date: 07/13/2019   ECHOCARDIOGRAM REPORT   Patient Name:   Pamela Vasquez Date of Exam: 07/13/2019 Medical Rec #:  [id number]       Height:       64.0 in Accession #:    [id number]      Weight:       164.2 lb Date of Birth:  Apr 01, 1951      BSA:          1.80 m Patient Age:    68 years        BP:           164/92 mmHg Patient Gender: F               HR:           106 bpm. [facility] Procedure: 2D Echo, Cardiac Doppler and Color Doppler Indications:    Stroke 434.91 / I163.9  History:        Patient has prior history of Echocardiogram examinations, most                 recent 03/06/2013. Risk Factors:Hypertension and Dyslipidemia.                 Acute ischemic stroke,Left hemiparesis.  Sonographer:     Celesta Gentile RCS Referring Phys: [phone] Neysha Criado  Sonographer Comments: NOTE: Patient has no I.V. access at this time. Patient pulled out prior to my arrival. IMPRESSIONS  1. Left ventricular ejection fraction, by visual estimation, is 70 to 75%. The left ventricle has hyperdynamic function. There is mildly increased left ventricular hypertrophy.  2. Left ventricular diastolic parameters are consistent with Grade I diastolic dysfunction (impaired relaxation).  3. The left ventricle  has no regional wall motion abnormalities.  4. Global right ventricle has hyperdynamic systolic function.The right ventricular size is normal. Right vetricular wall thickness was not assessed.  5. Left atrial size was normal.  6. Right atrial size was normal.  7. Moderate mitral annular calcification.  8. The mitral valve is grossly normal. No evidence of mitral valve regurgitation.  9. The tricuspid valve is not well visualized. 10. The tricuspid valve is not well visualized. Tricuspid valve regurgitation is not demonstrated. 11. The aortic valve was not well visualized. Aortic valve regurgitation is not visualized. No evidence of aortic valve sclerosis or stenosis. 12. The pulmonic valve was not well visualized. Pulmonic valve regurgitation is not visualized. FINDINGS  Left Ventricle: Left ventricular ejection fraction, by visual estimation, is 70 to 75%. The left ventricle has hyperdynamic function. The left ventricle has no regional wall motion abnormalities. The left ventricular internal cavity size was the left ventricle is normal in size. There is mildly increased left ventricular hypertrophy. Concentric left ventricular hypertrophy. Left ventricular diastolic parameters are consistent with Grade I diastolic dysfunction (impaired relaxation). Normal left atrial pressure. Right Ventricle: The right ventricular size is normal. Right vetricular wall thickness was not assessed. Global RV systolic function is has hyperdynamic systolic  function. Left Atrium: Left atrial size was normal in size. Right Atrium: Right atrial size was normal in size Pericardium: There is no evidence of pericardial effusion. Mitral Valve: The mitral valve is grossly normal. Moderate mitral annular calcification. No evidence of mitral valve regurgitation. Tricuspid Valve: The tricuspid valve is not well visualized. Tricuspid valve regurgitation is not demonstrated. Aortic Valve: The aortic valve was not well visualized. Aortic valve regurgitation is not visualized. The aortic valve is structurally normal, with no evidence of sclerosis or stenosis. Mild to moderate aortic valve annular calcification. Pulmonic Valve: The pulmonic valve was not well visualized. Pulmonic valve regurgitation is not visualized. Pulmonic regurgitation is not visualized. Aorta: The aortic root is normal in size and structure. Venous: The inferior vena cava was not well visualized. IAS/Shunts: No atrial level shunt detected by color flow Doppler.  LEFT VENTRICLE PLAX 2D LVIDd:         3.69 cm  Diastology LVIDs:         1.91 cm  LV e' lateral:   8.16 cm/s LV PW:         1.13 cm  LV E/e' lateral: 8.0 LV IVS:        1.13 cm  LV e' medial:    8.16 cm/s LVOT diam:     1.70 cm  LV E/e' medial:  8.0 LV SV:         46 ml LV SV Index:   25.03 LVOT Area:     2.27 cm  RIGHT VENTRICLE RV S prime:     17.60 cm/s TAPSE (M-mode): 1.5 cm LEFT ATRIUM           Index       RIGHT ATRIUM          Index LA diam:      2.50 cm 1.39 cm/m  RA Area:     8.30 cm LA Vol (A2C): 13.1 ml 7.28 ml/m  RA Volume:   13.50 ml 7.50 ml/m LA Vol (A4C): 18.4 ml 10.23 ml/m  AORTIC VALVE LVOT Vmax:   137.00 cm/s LVOT Vmean:  96.000 cm/s LVOT VTI:    0.229 m  AORTA Ao Root diam: 3.00 cm MITRAL VALVE MV Area (PHT): 3.17 cm  SHUNTS MV PHT:        69.31 msec            Systemic VTI:  0.23 m MV Decel Time: 239 msec              Systemic Diam: 1.70 cm MV E velocity: 65.60 cm/s  103 cm/s MV A velocity: 114.00 cm/s 70.3 cm/s MV  E/A ratio:  0.58        1.5  Prentice Docker MD Electronically signed by Prentice Docker MD Signature Date/Time: 07/13/2019/4:28:04 PM    Final     Catarina Hartshorn, DO  [facility] Pager [phone]  If 7PM-7AM, please contact night-coverage [email or web address] Password [facility] 07/15/2019, 11:38 AM   LOS: 2 days

## 2019-07-16 ENCOUNTER — Inpatient Hospital Stay (HOSPITAL_COMMUNITY)
Admit: 2019-07-16 | Discharge: 2019-07-16 | Disposition: A | Discharge status: Home / Self Care | Payer: Medicare Other | Attending: Neurology | Admitting: Neurology

## 2019-07-16 LAB — SARS CORONAVIRUS 2 (TAT 6-24 HRS): SARS Coronavirus 2: NEGATIVE

## 2019-07-16 NOTE — Progress Notes (Signed)
Physical Therapy Treatment Patient Details Name: Pamela Vasquez MRN: [id number] DOB: [birth year] Today's Date: 07/16/2019    History of Present Illness Pamela Vasquez  is a 69 y.o. female, w fibromyalgia, gerd, hypertension,  presents with fall .  Pt states that she fell in her bedroom while trying to go to the bathroom.  Pt didn't have her phone and couldn't get back up.  Pt's son found her a couple hours later ?   Pt is a poor historian, has difficulty with speech. Pt can not move her left arm or left leg.  I spoke with her son and this apparently has been going on for about 1 year.    PT Comments    Patient very weak and requires Max assistance to sit up at bedside with limited ability to prop up on right elbow, demonstrates poor sitting balance with frequent falling in all directions, but mostly forward and to the left, required active assistance to flex right hip due to weakness whe completing exercises seated at bedside, unable lift LLE against gravity and to weak to attempt sit to stands or transfers.  Patient put back to bed with total assistance to reposition.  Patient will benefit from continued physical therapy in hospital and recommended venue below to increase strength, balance, endurance for safe ADLs and gait.   Follow Up Recommendations  SNF     Equipment Recommendations  None recommended by PT    Recommendations for Other Services       Precautions / Restrictions Precautions Precautions: Fall Restrictions Weight Bearing Restrictions: No    Mobility  Bed Mobility Overal bed mobility: Needs Assistance Bed Mobility: Supine to Sit;Sit to Supine     Supine to sit: Max assist Sit to supine: Max assist   General bed mobility comments: very weak and unable to use RUE to help pull self up  Transfers                    Ambulation/Gait                 Stairs             Wheelchair Mobility    Modified Rankin (Stroke Patients Only)        Balance Overall balance assessment: Needs assistance Sitting-balance support: Feet supported;Single extremity supported Sitting balance-Leahy Scale: Poor Sitting balance - Comments: frequent falling in all directions while seated at EOB Postural control: Posterior lean;Left lateral lean                                  Cognition Arousal/Alertness: Awake/alert Behavior During Therapy: WFL for tasks assessed/performed;Flat affect Overall Cognitive Status: Within Functional Limits for tasks assessed                                        Exercises General Exercises - Lower Extremity Long Arc Quad: Seated;AROM;Right;10 reps Hip Flexion/Marching: Seated;AAROM;Right;10 reps Heel Raises: Seated;Right;10 reps;AROM    General Comments        Pertinent Vitals/Pain Pain Assessment: No/denies pain    Home Living                      Prior Function            PT Goals (current goals can now be found in  the care plan section) Acute Rehab PT Goals Patient Stated Goal: return home after rehab PT Goal Formulation: With patient Time For Goal Achievement: 07/27/19 Potential to Achieve Goals: Good Progress towards PT goals: Progressing toward goals    Frequency    Min 3X/week      PT Plan Current plan remains appropriate    Co-evaluation              AM-PAC PT "6 Clicks" Mobility   Outcome Measure  Help needed turning from your back to your side while in a flat bed without using bedrails?: A Lot Help needed moving from lying on your back to sitting on the side of a flat bed without using bedrails?: A Lot Help needed moving to and from a bed to a chair (including a wheelchair)?: Total Help needed standing up from a chair using your arms (e.g., wheelchair or bedside chair)?: Total Help needed to walk in hospital room?: Total Help needed climbing 3-5 steps with a railing? : Total 6 Click Score: 8    End of Session   Activity  Tolerance: Patient tolerated treatment well;Patient limited by fatigue Patient left: in bed;with call bell/phone within reach;with bed alarm set Nurse Communication: Mobility status PT Visit Diagnosis: History of falling (Z91.81);Muscle weakness (generalized) (M62.81);Unsteadiness on feet (R26.81)     Time: 5809-9833 PT Time Calculation (min) (ACUTE ONLY): 25 min  Charges:  $Therapeutic Exercise: 8-22 mins $Therapeutic Activity: 8-22 mins                     2:57 PM, 07/16/19 Ocie Bob, MPT Physical Therapist with [facility] 336 [phone] office [phone] mobile phone

## 2019-07-16 NOTE — TOC Progression Note (Signed)
Transition of Care [facility]) - Progression Note    Patient Details  Name: [name] MRN: [id number] Date of Birth: [birth year]  Transition of Care [facility] CM/SW Contact  Elliot Gault, LCSW Phone Number: 07/16/2019, 2:08 PM  Clinical Narrative:     TOC following. Discussed bed offer status with pt and her son. Pt responds with head nodding and gestures but no verbal responses. Pt's son expresses that they will accept bed offer from [facility].   Updated Elease Hashimoto at [facility]. Started Firefighter. Covid test pending. Anticipating dc tomorrow.  TOC will follow.  Expected Discharge Plan: Skilled Nursing Facility Barriers to Discharge: Continued Medical Work up  Expected Discharge Plan and Services Expected Discharge Plan: Skilled Nursing Facility       Living arrangements for the past 2 months: Single Family Home                                       Social Determinants of Health (SDOH) Interventions    Readmission Risk Interventions No flowsheet data found.

## 2019-07-16 NOTE — Procedures (Signed)
  [facility] Ayva Veilleux A. Gerilyn Pilgrim, MD     [email or web address]           HISTORY: This is a 69 year old female who presents with altered mental status that is concerning for complex partial seizures.  MEDICATIONS:  Current Facility-Administered Medications:  .  acetaminophen (TYLENOL) tablet 650 mg, 650 mg, Oral, Q6H PRN, 650 mg at 07/14/19 1153 **OR** acetaminophen (TYLENOL) suppository 650 mg, 650 mg, Rectal, Q6H PRN, Pearson Grippe, MD .  amoxicillin-clavulanate (AUGMENTIN) 875-125 MG per tablet 1 tablet, 1 tablet, Oral, Q12H, Catarina Hartshorn, MD, 1 tablet at 07/16/19 0817 .  aspirin EC tablet 325 mg, 325 mg, Oral, Daily, Tat, David, MD, 325 mg at 07/16/19 0817 .  atorvastatin (LIPITOR) tablet 40 mg, 40 mg, Oral, q1800, Tat, David, MD, 40 mg at 07/15/19 1710 .  clopidogrel (PLAVIX) tablet 75 mg, 75 mg, Oral, Q breakfast, Aayat Hajjar, MD, 75 mg at 07/16/19 0817 .  enoxaparin (LOVENOX) injection 40 mg, 40 mg, Subcutaneous, Q24H, Pearson Grippe, MD, 40 mg at 07/15/19 2041 .  hydrALAZINE (APRESOLINE) injection 10 mg, 10 mg, Intravenous, Q6H PRN, Tat, David, MD .  metoprolol tartrate (LOPRESSOR) tablet 50 mg, 50 mg, Oral, BID, Tat, David, MD, 50 mg at 07/16/19 0817 .  pantoprazole (PROTONIX) EC tablet 40 mg, 40 mg, Oral, Daily PRN, Tat, David, MD     ANALYSIS: A 16 channel recording using standard 10 20 measurements is conducted for 23 minutes.  There is a well-formed posterior dominant rhythm of 8 hertz which attenuates with eye opening. There is beta activity observed in the frontal areas. Awake and drowsy architecture are noted. There are occasional bitemporal theta slowing that is synchronous although sometimes this slowing also occurs unilaterally. There are infrequent generalized delta slowing observed. Photic stimulation and hyperventilation are not conducted. There is no focal or lateralized slowing. There is no epileptiform activity is noted.   IMPRESSION: 1.  The recording shows  occasional generalized theta and delta slowing of unclear significance clinically. Otherwise, the recording is unrevealing.      Alnisa Hasley A. Gerilyn Pilgrim, M.D.  Diplomate, Biomedical engineer of Psychiatry and Neurology ( Neurology).

## 2019-07-16 NOTE — Progress Notes (Signed)
EEG completed, results pending. 

## 2019-07-16 NOTE — Progress Notes (Signed)
OT Cancellation Note  Patient Details Name: Pamela Vasquez MRN: [id number] DOB: Oct 06, 1950   Cancelled Treatment:    Reason Eval/Treat Not Completed: Fatigue/lethargy limiting ability to participate. Patient in bed upon therapy arrival and OT evaluation was attempted. Patient will not desire to participate in evaluation. Reports that she lives alone and uses her scooter for transportation. Does report that she walks short distances with her walker. She remained motionless with a peanut butter cheese cracker in her right hand while making no attempt to bring it to her mouth to self feed. Therapist made several comments regarding the cracker to see if she would engage in self feeding. Patient declined to transfer to recliner and sit up this AM. Plan is to discharge to SNF for skilled therapy. Recommend staff OT evaluate patient to determine therapy needs as patient lives alone and at this time would not be able to care for herself. OT will sign off due to planned discharge. All further needs made be made at next venue.     Limmie Patricia, OTR/L,CBIS  [phone]  07/16/2019, 8:59 AM

## 2019-07-16 NOTE — Progress Notes (Signed)
PROGRESS NOTE  Pamela Vasquez FTD:[id number] DOB: 15-Feb-1951 DOA: 07/12/2019 PCP: Patient, No Pcp Per  Brief History: 69 year old female with a history of fibromyalgia, depression, hypertension presenting with a mechanical fall off her commode. The patient is a poor and difficult historian. Much of this history is obtained from review of medical record and speaking with the patient's son. Apparently, the patient fell off the commode and because of pain and weakness she was not able to get up off the floor. She is unable to tell me if she has had a period of generalized weakness or focal weakness prior to her fall. Regardless, her family tried to reach her by phone and was unable to speak with her. As result, her son came to check up on her and found her on the floor. According to the patient's son, the patient is able to ambulate and drive and perform her activities of daily living. Her son relates a very long history of depression and malingering type symptoms with regard to the patient. In fact, the patient's son relates a long history of attention seekingbehavior with regard to the patient. He states that the patient is usually noncompliant with medical care. He states that up until 2 days ago, she had not had any difficulty driving and getting around. Aside from some dental pain, the patient has not had any recent complaints. She denies any headache, chest pain, shortness breath, abdominal pain, nausea, vomiting, diarrhea. In the emergency department, the patient was afebrile hemodynamically stable with oxygen saturation 96% on room air. Her blood pressure was 209/121. BMP, LFTs, and CT of the brain were unremarkable. WBC 17.2, hemoglobin 16.3, platelets 249,000. X-rays of the lumbar, thoracic spine were negative. X-ray of the pelvis was negative. CPK was 1204.  She was fluid resuscitated with improvement of CK.  MRI brain showed R-ICA ischemic stroke.  Neurology was  consulted and recommended DAPT.  PT recommended SNF with which the patient and son agreed.  Assessment/Plan: Acute R-ACA Ischemic Stroke -she has left side neglect and dysphasia (slow responses)--currently at baseline -Serum C62--376 -Folic EGBT--51.7 -OHY--0.737 -Free T4--0.91 -07/12/2019 CT brain negative -Urine drug screen--neg -Appreciate Neurology Consult -PT/OT evaluation-->SNF -Speech therapy eval-->regular with thin -MRI brain--moderately large R-ACA acute infarct -MRA brain--no LVO; bilateral severe PCA stenosis -Carotid Duplex--no hemodynamically significant stenosis -Echo--EF 70-75%, G1DD, no PFO -LDL--233 -HbA1C--5.5 -Antiplatelet--ASA+Plavix  Leukocytosis -Suspect stress demargination -1/29 CXR personally reviewed--no infiltrates -UA--no pyuria  Rhabdomyolysis -CPK 1204 at the time of presentation -Continue IV fluids -Repeat [id number]>>200  Hypokalemia -Replete -Check magnesium--2.1  Hypertensive urgency -allowed permissive HTN initially -started low dose metoprolol--slowly increase dose-->50 mg bid  Hyperglycemia -Hemoglobin A1c--5.5  Tachycardia -likely stress induced -thyroid functions as above -echo as above -d-dimer--1.00 -CTA chest--neg PE;  Multinodular goiter; no pulmonary masses or consolidation -titrate up metoprolol  Dental caries -amox/clav x 1 week     Disposition Plan:SNF 2/2 when bed available Family Communication:Son updated 1/31at bedside  Consultants:none  Code Status: FULL  DVT Prophylaxis: [place] Lovenox   Procedures: As Listed in Progress Note Above  Antibiotics: None      Subjective: Patient denies fevers, chills, headache, chest pain, dyspnea, nausea, vomiting, diarrhea, abdominal pain, dysuria, hematuria,    Objective: Vitals:   07/16/19 0405 07/16/19 0816 07/16/19 1336 07/16/19 1400  BP: (!) 175/101  (!) 179/105 (!) 175/108  Pulse: 86  89 95  Resp: 18  18 17   Temp:  98.5 F (36.9 C)  98 F (36.7 C) 98 F (36.7 C)  TempSrc:   Oral Oral  SpO2: 94% 96% 93% 95%  Weight:      Height:        Intake/Output Summary (Last 24 hours) at 07/16/2019 1613 Last data filed at 07/16/2019 1303 Gross per 24 hour  Intake 60 ml  Output 500 ml  Net -440 ml   Weight change:  Exam:   General:  Pt is alert, follows commands appropriately, not in acute distress  HEENT: No icterus, No thrush, No neck mass, [place]/AT  Cardiovascular: RRR, S1/S2, no rubs, no gallops  Respiratory: CTA bilaterally, no wheezing, no crackles, no rhonchi  Abdomen: Soft/+BS, non tender, non distended, no guarding  Extremities: No edema, No lymphangitis, No petechiae, No rashes, no synovitis   Data Reviewed: I have personally reviewed following labs and imaging studies Basic Metabolic Panel: Recent Labs  Lab 07/12/19 1623 07/13/19 0412 07/13/19 0753 07/14/19 0507 07/15/19 0543  NA 137 137  --  140 140  K 3.3* 3.6  --  3.6 3.7  CL 100 104  --  110 109  CO2 23 21*  --  20* 21*  GLUCOSE 150* 131*  --  103* 92  BUN 14 13  --  14 16  CREATININE 0.92 0.86  --  0.81 0.78  CALCIUM 9.6 9.2  --  8.9 8.9  MG  --   --  2.0 2.1 2.0   Liver Function Tests: Recent Labs  Lab 07/12/19 1623 07/13/19 0412  AST 33 32  ALT 25 23  ALKPHOS 101 87  BILITOT 0.8 0.8  PROT 8.8* 7.8  ALBUMIN 4.3 3.8   No results for input(s): LIPASE, AMYLASE in the last 168 hours. No results for input(s): AMMONIA in the last 168 hours. Coagulation Profile: No results for input(s): INR, PROTIME in the last 168 hours. CBC: Recent Labs  Lab 07/12/19 1623 07/13/19 0412 07/14/19 0507 07/15/19 0543  WBC 17.2* 16.0* 12.1* 11.0*  HGB 16.3* 15.7* 13.8 14.1  HCT 47.9* 45.4 42.5 42.9  MCV 87.2 87.3 90.4 90.3  PLT 339 249 295 294   Cardiac Enzymes: Recent Labs  Lab 07/12/19 1623 07/12/19 2006 07/13/19 0412 07/14/19 0507 07/15/19 0543  CKTOTAL 1,078* 1,204* 1,093*  1,095* 517* 208  CKMB  --   --  9.0*   --   --    BNP: Invalid input(s): POCBNP CBG: No results for input(s): GLUCAP in the last 168 hours. HbA1C: No results for input(s): HGBA1C in the last 72 hours. Urine analysis:    Component Value Date/Time   COLORURINE YELLOW 07/13/2019 1122   APPEARANCEUR CLEAR 07/13/2019 1122   LABSPEC 1.015 07/13/2019 1122   PHURINE 7.0 07/13/2019 1122   GLUCOSEU NEGATIVE 07/13/2019 1122   HGBUR SMALL (A) 07/13/2019 1122   BILIRUBINUR NEGATIVE 07/13/2019 1122   KETONESUR NEGATIVE 07/13/2019 1122   PROTEINUR 30 (A) 07/13/2019 1122   UROBILINOGEN 0.2 06/28/2013 0918   NITRITE NEGATIVE 07/13/2019 1122   LEUKOCYTESUR NEGATIVE 07/13/2019 1122   Sepsis Labs: @LABRCNTIP (procalcitonin:4,lacticidven:4) ) Recent Results (from the past 240 hour(s))  Respiratory Panel by RT PCR (Flu A&B, Covid) - Nasopharyngeal Swab     Status: None   Collection Time: 07/12/19  9:27 PM   Specimen: Nasopharyngeal Swab  Result Value Ref Range Status   SARS Coronavirus 2 by RT PCR NEGATIVE NEGATIVE Final    Comment: (NOTE) SARS-CoV-2 target nucleic acids are NOT DETECTED. The SARS-CoV-2 RNA is generally detectable in upper respiratoy specimens during  the acute phase of infection. The lowest concentration of SARS-CoV-2 viral copies this assay can detect is 131 copies/mL. A negative result does not preclude SARS-Cov-2 infection and should not be used as the sole basis for treatment or other patient management decisions. A negative result may occur with  improper specimen collection/handling, submission of specimen other than nasopharyngeal swab, presence of viral mutation(s) within the areas targeted by this assay, and inadequate number of viral copies (<131 copies/mL). A negative result must be combined with clinical observations, patient history, and epidemiological information. The expected result is Negative. Fact Sheet for Patients:  https://www.moore.com/ Fact Sheet for Healthcare  Providers:  https://www.young.biz/ This test is not yet ap proved or cleared by the [place] FDA and  has been authorized for detection and/or diagnosis of SARS-CoV-2 by FDA under an Emergency Use Authorization (EUA). This EUA will remain  in effect (meaning this test can be used) for the duration of the COVID-19 declaration under Section 564(b)(1) of the Act, 21 U.S.C. section 360bbb-3(b)(1), unless the authorization is terminated or revoked sooner.    Influenza A by PCR NEGATIVE NEGATIVE Final   Influenza B by PCR NEGATIVE NEGATIVE Final    Comment: (NOTE) The Xpert Xpress SARS-CoV-2/FLU/RSV assay is intended as an aid in  the diagnosis of influenza from Nasopharyngeal swab specimens and  should not be used as a sole basis for treatment. Nasal washings and  aspirates are unacceptable for Xpert Xpress SARS-CoV-2/FLU/RSV  testing. Fact Sheet for Patients: https://www.moore.com/ Fact Sheet for Healthcare Providers: https://www.young.biz/ This test is not yet approved or cleared by the [place] FDA and  has been authorized for detection and/or diagnosis of SARS-CoV-2 by  FDA under an Emergency Use Authorization (EUA). This EUA will remain  in effect (meaning this test can be used) for the duration of the  Covid-19 declaration under Section 564(b)(1) of the Act, 21  U.S.C. section 360bbb-3(b)(1), unless the authorization is  terminated or revoked. Performed at [facility], [address] [place], [place] [address]   Culture, Urine     Status: None   Collection Time: 07/13/19 11:22 AM   Specimen: Urine, Clean Catch  Result Value Ref Range Status   Specimen Description   Final    URINE, CLEAN CATCH Performed at [facility], [address], [place], [place] [address]    Special Requests   Final    NONE Performed at [facility], [address] [place], [place] [address]    Culture   Final    NO GROWTH Performed  at [facility], [address], [place], [place] [address]    Report Status 07/14/2019 FINAL  Final     Scheduled Meds: . amoxicillin-clavulanate  1 tablet Oral Q12H  . aspirin EC  325 mg Oral Daily  . atorvastatin  40 mg Oral q1800  . clopidogrel  75 mg Oral Q breakfast  . enoxaparin (LOVENOX) injection  40 mg Subcutaneous Q24H  . metoprolol tartrate  50 mg Oral BID   Continuous Infusions:  Procedures/Studies: DG Thoracic Spine 2 View  Result Date: 07/12/2019 CLINICAL DATA:  Fall with back pain EXAM: THORACIC SPINE 2 VIEWS COMPARISON:  Chest radiograph dated 07/23/2015 FINDINGS: There is no evidence of thoracic spine fracture. Alignment is normal. Mild degenerative changes are seen in the thoracic spine. IMPRESSION: No evidence of acute fracture or subluxation. Electronically Signed   By: Romona Curls M.D.   On: 07/12/2019 19:32   DG Lumbar Spine Complete  Result  Date: 07/12/2019 CLINICAL DATA:  Fall with back pain EXAM: LUMBAR SPINE - COMPLETE 4+ VIEW COMPARISON:  None. FINDINGS: There is no evidence of lumbar spine fracture. Alignment is normal. Mild degenerative disc and joint disease is seen in the lumbar spine. IMPRESSION: No evidence of acute fracture or subluxation. Electronically Signed   By: Romona Curls M.D.   On: 07/12/2019 19:30   DG Pelvis 1-2 Views  Result Date: 07/12/2019 CLINICAL DATA:  Fall with pain. EXAM: PELVIS - 1-2 VIEW COMPARISON:  None. FINDINGS: There is no evidence of pelvic fracture or diastasis. No pelvic bone lesions are seen. Mild degenerative changes are seen in the lumbar spine. IMPRESSION: No acute osseous injury. Electronically Signed   By: Romona Curls M.D.   On: 07/12/2019 19:32   CT HEAD WO CONTRAST  Result Date: 07/12/2019 CLINICAL DATA:  Ataxia left-sided weakness fall EXAM: CT HEAD WITHOUT CONTRAST TECHNIQUE: Contiguous axial images were obtained from the base of the skull through the vertex without intravenous contrast. COMPARISON:  CT  brain 01/26/2018 FINDINGS: Brain: No acute territorial infarction, hemorrhage, or intracranial mass is visualized. Patchy hypodensity in the white matter consistent with chronic small vessel ischemic change. CSF cystic density at the left basal ganglia without change, possible chronic infarct versus choroidal fissure cyst, this is without change. Similar enlargement of the ventricular system. Vascular: No hyperdense vessels.  Carotid vascular calcification Skull: Normal. Negative for fracture or focal lesion. Sinuses/Orbits: Patchy mucosal thickening in the ethmoid sinuses Other: None IMPRESSION: 1. No CT evidence for acute intracranial abnormality. 2. Mild chronic small vessel ischemic change of the white matter. Electronically Signed   By: Jasmine Pang M.D.   On: 07/12/2019 23:23   CT ANGIO CHEST PE W OR WO CONTRAST  Result Date: 07/15/2019 CLINICAL DATA:  Chest pain and shortness of breath for several days. Elevated D-dimer. EXAM: CT ANGIOGRAPHY CHEST WITH CONTRAST TECHNIQUE: Multidetector CT imaging of the chest was performed using the standard protocol during bolus administration of intravenous contrast. Multiplanar CT image reconstructions and MIPs were obtained to evaluate the vascular anatomy. CONTRAST:  OMNIPAQUE IOHEXOL 350 MG/ML SOLN COMPARISON:  None. FINDINGS: Cardiovascular: Some motion artifact noted. Satisfactory opacification of pulmonary arteries noted, and no pulmonary emboli identified. No evidence of thoracic aortic dissection or aneurysm. Aortic and coronary artery atherosclerosis incidentally noted. Mediastinum/Nodes: Multinodular goiter is seen, with largest nodule in the left lobe measuring 2.2 cm. No pathologically enlarged lymph nodes identified. Lungs/Pleura: Motion artifact noted. Mild atelectasis or scarring is seen involving the inferior lingula and right lower lobe. No suspicious pulmonary nodules or masses identified. No evidence of pulmonary consolidation or pleural  effusion. Upper abdomen: No acute findings. Musculoskeletal: No suspicious bone lesions identified. Review of the MIP images confirms the above findings. IMPRESSION: 1. No evidence of pulmonary embolism. 2. Mild atelectasis or scarring in lingula and right lower lobe. 3. Multinodular goiter, with largest nodule in left lobe measuring 2.2 cm. Recommend thyroid ultrasound for further evaluation. (Ref: J Am Coll Radiol. 2015 Feb;12(2): 143-50). Aortic Atherosclerosis (ICD10-I70.0). Coronary artery atherosclerosis. Electronically Signed   By: Danae Orleans M.D.   On: 07/15/2019 14:04   MR ANGIO HEAD WO CONTRAST  Result Date: 07/13/2019 CLINICAL DATA:  Ataxia, stroke suspected. Left-sided weakness. Recent fall. EXAM: MRI HEAD WITHOUT CONTRAST MRA HEAD WITHOUT CONTRAST TECHNIQUE: Multiplanar, multiecho pulse sequences of the brain and surrounding structures were obtained without intravenous contrast. Angiographic images of the head were obtained using MRA technique without contrast. COMPARISON:  Head  CT 07/12/2019 FINDINGS: MRI HEAD FINDINGS The study is mildly motion degraded. Brain: There is a moderately large acute distal right ACA infarct involving the parasagittal frontal and parietal lobes, cingulate gyrus, and body of the corpus callosum. There is no associated hemorrhage. T2 hyperintensities elsewhere in the cerebral white matter bilaterally are nonspecific but compatible with mild chronic small vessel ischemic disease. A 1 cm T2 hyperintensity inferiorly in the left basal ganglia/subinsular region was present on a 2013 head CT and may reflect a chronic lacunar infarct versus a dilated perivascular space or developmental cyst, with mild surrounding gliosis potentially favoring an old infarct. Ventriculomegaly is favored to reflect moderately advanced central predominant cerebral atrophy over hydrocephalus. A few chronic microhemorrhages are noted in the cerebellum bilaterally likely related to chronic  hypertension. There is also a single chronic microhemorrhage in the medial left frontoparietal region. Vascular: Major intracranial vascular flow voids are preserved. Skull and upper cervical spine: No suspicious marrow lesion. Sinuses/Orbits: Paranasal sinuses and mastoid air cells are clear. Unremarkable orbits. Other: None. MRA HEAD FINDINGS The study is motion degraded including moderately severe motion through the ACA and MCA branches. The visualized distal vertebral arteries are patent to the basilar and codominant. Patent left PICA, right AICA, and bilateral SCA origins are visualized. The basilar artery is patent with mild stenosis versus artifact in its midportion. The PCAs are patent proximally with regions of signal loss involving the distal P1 and right greater than left P2 segments bilaterally suggesting severe stenoses although this may also be in part artifactual. The internal carotid arteries are widely patent from skull base to carotid termini. A1 and M1 segments are patent without evidence of flow limiting stenosis. No proximal branch occlusion is evident in the anterior circulation although branch vessel assessment is limited by motion. No aneurysm is identified. IMPRESSION: 1. Moderately large acute distal right ACA infarct. 2. Mild chronic small vessel ischemic disease. 3. Moderately advanced cerebral atrophy. 4. Motion degraded head MRA without evidence of large vessel occlusion or flow limiting proximal stenosis in the anterior circulation. 5. Potentially severe proximal PCA stenoses bilaterally. Electronically Signed   By: Sebastian Ache M.D.   On: 07/13/2019 10:56   MR BRAIN WO CONTRAST  Result Date: 07/13/2019 CLINICAL DATA:  Ataxia, stroke suspected. Left-sided weakness. Recent fall. EXAM: MRI HEAD WITHOUT CONTRAST MRA HEAD WITHOUT CONTRAST TECHNIQUE: Multiplanar, multiecho pulse sequences of the brain and surrounding structures were obtained without intravenous contrast. Angiographic  images of the head were obtained using MRA technique without contrast. COMPARISON:  Head CT 07/12/2019 FINDINGS: MRI HEAD FINDINGS The study is mildly motion degraded. Brain: There is a moderately large acute distal right ACA infarct involving the parasagittal frontal and parietal lobes, cingulate gyrus, and body of the corpus callosum. There is no associated hemorrhage. T2 hyperintensities elsewhere in the cerebral white matter bilaterally are nonspecific but compatible with mild chronic small vessel ischemic disease. A 1 cm T2 hyperintensity inferiorly in the left basal ganglia/subinsular region was present on a 2013 head CT and may reflect a chronic lacunar infarct versus a dilated perivascular space or developmental cyst, with mild surrounding gliosis potentially favoring an old infarct. Ventriculomegaly is favored to reflect moderately advanced central predominant cerebral atrophy over hydrocephalus. A few chronic microhemorrhages are noted in the cerebellum bilaterally likely related to chronic hypertension. There is also a single chronic microhemorrhage in the medial left frontoparietal region. Vascular: Major intracranial vascular flow voids are preserved. Skull and upper cervical spine: No suspicious marrow lesion. Sinuses/Orbits:  Paranasal sinuses and mastoid air cells are clear. Unremarkable orbits. Other: None. MRA HEAD FINDINGS The study is motion degraded including moderately severe motion through the ACA and MCA branches. The visualized distal vertebral arteries are patent to the basilar and codominant. Patent left PICA, right AICA, and bilateral SCA origins are visualized. The basilar artery is patent with mild stenosis versus artifact in its midportion. The PCAs are patent proximally with regions of signal loss involving the distal P1 and right greater than left P2 segments bilaterally suggesting severe stenoses although this may also be in part artifactual. The internal carotid arteries are widely  patent from skull base to carotid termini. A1 and M1 segments are patent without evidence of flow limiting stenosis. No proximal branch occlusion is evident in the anterior circulation although branch vessel assessment is limited by motion. No aneurysm is identified. IMPRESSION: 1. Moderately large acute distal right ACA infarct. 2. Mild chronic small vessel ischemic disease. 3. Moderately advanced cerebral atrophy. 4. Motion degraded head MRA without evidence of large vessel occlusion or flow limiting proximal stenosis in the anterior circulation. 5. Potentially severe proximal PCA stenoses bilaterally. Electronically Signed   By: Sebastian Ache M.D.   On: 07/13/2019 10:56   US Carotid Bilateral  Result Date: 07/13/2019 CLINICAL DATA:  69 year old female with a history of acute stroke EXAM: BILATERAL CAROTID DUPLEX ULTRASOUND TECHNIQUE: Wallace Cullens scale imaging, color Doppler and duplex ultrasound were performed of bilateral carotid and vertebral arteries in the neck. COMPARISON:  None. FINDINGS: Criteria: Quantification of carotid stenosis is based on velocity parameters that correlate the residual internal carotid diameter with NASCET-based stenosis levels, using the diameter of the distal internal carotid lumen as the denominator for stenosis measurement. The following velocity measurements were obtained: RIGHT ICA:  Systolic 81 cm/sec, Diastolic 12 cm/sec CCA:  161 cm/sec SYSTOLIC ICA/CCA RATIO:  0.5 ECA:  109 cm/sec LEFT ICA:  Systolic 112 cm/sec, Diastolic 18 cm/sec CCA:  134 cm/sec SYSTOLIC ICA/CCA RATIO:  0.8 ECA:  128 cm/sec Right Brachial SBP: Not acquired Left Brachial SBP: Not acquired RIGHT CAROTID ARTERY: No significant calcified disease of the right common carotid artery. Intermediate waveform maintained. Heterogeneous plaque without significant calcifications at the right carotid bifurcation. Low resistance waveform of the right ICA. No significant tortuosity. RIGHT VERTEBRAL ARTERY: Antegrade flow with  low resistance waveform. LEFT CAROTID ARTERY: No significant calcified disease of the left common carotid artery. Intermediate waveform maintained. Heterogeneous plaque at the left carotid bifurcation without significant calcifications. Low resistance waveform of the left ICA. LEFT VERTEBRAL ARTERY:  Antegrade flow with low resistance waveform. IMPRESSION: Color duplex indicates minimal heterogeneous plaque, with no hemodynamically significant stenosis by duplex criteria in the extracranial cerebrovascular circulation. Signed, Yvone Neu. Reyne Dumas, RPVI [facility] Electronically Signed   By: Gilmer Mor D.O.   On: 07/13/2019 15:09   DG CHEST PORT 1 VIEW  Result Date: 07/13/2019 CLINICAL DATA:  Shortness of breath.  Confusion. EXAM: PORTABLE CHEST 1 VIEW COMPARISON:  July 23, 2015 FINDINGS: Lungs are clear. Heart size and pulmonary vascularity are within normal limits. No adenopathy. No bone lesions. IMPRESSION: Lungs clear.  Cardiac silhouette normal.  No adenopathy. Electronically Signed   By: Bretta Bang III M.D.   On: 07/13/2019 08:22   ECHOCARDIOGRAM COMPLETE  Result Date: 07/13/2019   ECHOCARDIOGRAM REPORT   Patient Name:   DIANELY KREHBIEL Date of Exam: 07/13/2019 Medical Rec #:  [id number]       Height:  64.0 in Accession #:    [id number]      Weight:       164.2 lb Date of Birth:  1950-06-20      BSA:          1.80 m Patient Age:    68 years        BP:           164/92 mmHg Patient Gender: F               HR:           106 bpm. [facility] Procedure: 2D Echo, Cardiac Doppler and Color Doppler Indications:    Stroke 434.91 / I163.9  History:        Patient has prior history of Echocardiogram examinations, most                 recent 03/06/2013. Risk Factors:Hypertension and Dyslipidemia.                 Acute ischemic stroke,Left hemiparesis.  Sonographer:    Celesta Gentile RCS Referring Phys: [phone] Bette Brienza   Sonographer Comments: NOTE: Patient has no I.V. access at this time. Patient pulled out prior to my arrival. IMPRESSIONS  1. Left ventricular ejection fraction, by visual estimation, is 70 to 75%. The left ventricle has hyperdynamic function. There is mildly increased left ventricular hypertrophy.  2. Left ventricular diastolic parameters are consistent with Grade I diastolic dysfunction (impaired relaxation).  3. The left ventricle has no regional wall motion abnormalities.  4. Global right ventricle has hyperdynamic systolic function.The right ventricular size is normal. Right vetricular wall thickness was not assessed.  5. Left atrial size was normal.  6. Right atrial size was normal.  7. Moderate mitral annular calcification.  8. The mitral valve is grossly normal. No evidence of mitral valve regurgitation.  9. The tricuspid valve is not well visualized. 10. The tricuspid valve is not well visualized. Tricuspid valve regurgitation is not demonstrated. 11. The aortic valve was not well visualized. Aortic valve regurgitation is not visualized. No evidence of aortic valve sclerosis or stenosis. 12. The pulmonic valve was not well visualized. Pulmonic valve regurgitation is not visualized. FINDINGS  Left Ventricle: Left ventricular ejection fraction, by visual estimation, is 70 to 75%. The left ventricle has hyperdynamic function. The left ventricle has no regional wall motion abnormalities. The left ventricular internal cavity size was the left ventricle is normal in size. There is mildly increased left ventricular hypertrophy. Concentric left ventricular hypertrophy. Left ventricular diastolic parameters are consistent with Grade I diastolic dysfunction (impaired relaxation). Normal left atrial pressure. Right Ventricle: The right ventricular size is normal. Right vetricular wall thickness was not assessed. Global RV systolic function is has hyperdynamic systolic function. Left Atrium: Left atrial size was normal  in size. Right Atrium: Right atrial size was normal in size Pericardium: There is no evidence of pericardial effusion. Mitral Valve: The mitral valve is grossly normal. Moderate mitral annular calcification. No evidence of mitral valve regurgitation. Tricuspid Valve: The tricuspid valve is not well visualized. Tricuspid valve regurgitation is not demonstrated. Aortic Valve: The aortic valve was not well visualized. Aortic valve regurgitation is not visualized. The aortic valve is structurally normal, with no evidence of sclerosis or stenosis. Mild to moderate aortic valve annular calcification. Pulmonic Valve: The pulmonic valve was not well visualized. Pulmonic valve regurgitation is not visualized. Pulmonic regurgitation is not visualized. Aorta: The aortic root is normal in size  and structure. Venous: The inferior vena cava was not well visualized. IAS/Shunts: No atrial level shunt detected by color flow Doppler.  LEFT VENTRICLE PLAX 2D LVIDd:         3.69 cm  Diastology LVIDs:         1.91 cm  LV e' lateral:   8.16 cm/s LV PW:         1.13 cm  LV E/e' lateral: 8.0 LV IVS:        1.13 cm  LV e' medial:    8.16 cm/s LVOT diam:     1.70 cm  LV E/e' medial:  8.0 LV SV:         46 ml LV SV Index:   25.03 LVOT Area:     2.27 cm  RIGHT VENTRICLE RV S prime:     17.60 cm/s TAPSE (M-mode): 1.5 cm LEFT ATRIUM           Index       RIGHT ATRIUM          Index LA diam:      2.50 cm 1.39 cm/m  RA Area:     8.30 cm LA Vol (A2C): 13.1 ml 7.28 ml/m  RA Volume:   13.50 ml 7.50 ml/m LA Vol (A4C): 18.4 ml 10.23 ml/m  AORTIC VALVE LVOT Vmax:   137.00 cm/s LVOT Vmean:  96.000 cm/s LVOT VTI:    0.229 m  AORTA Ao Root diam: 3.00 cm MITRAL VALVE MV Area (PHT): 3.17 cm              SHUNTS MV PHT:        69.31 msec            Systemic VTI:  0.23 m MV Decel Time: 239 msec              Systemic Diam: 1.70 cm MV E velocity: 65.60 cm/s  103 cm/s MV A velocity: 114.00 cm/s 70.3 cm/s MV E/A ratio:  0.58        1.5  Prentice Docker MD  Electronically signed by Prentice Docker MD Signature Date/Time: 07/13/2019/4:28:04 PM    Final     Catarina Hartshorn, DO  [facility] Pager [phone]  If 7PM-7AM, please contact night-coverage [email or web address] Password [email or web address] 07/16/2019, 4:13 PM   LOS: 3 days

## 2019-07-16 NOTE — Care Management Important Message (Signed)
Important Message  Patient Details  Name: Pamela Vasquez MRN: [id number] Date of Birth: 24-Mar-1951   Medicare Important Message Given:  Haydee Salter to son -[address], [place], [place] [address])     Corey Harold 07/16/2019, 3:05 PM

## 2019-07-17 LAB — BASIC METABOLIC PANEL
Anion gap: 10 (ref 5–15)
BUN: 28 mg/dL — ABNORMAL HIGH (ref 8–23)
CO2: 25 mmol/L (ref 22–32)
Calcium: 9 mg/dL (ref 8.9–10.3)
Chloride: 105 mmol/L (ref 98–111)
Creatinine, Ser: 0.86 mg/dL (ref 0.44–1.00)
GFR calc Af Amer: >60 mL/min (ref >60)
GFR calc non Af Amer: >60 mL/min (ref >60)
Glucose, Bld: 99 mg/dL (ref 70–99)
Potassium: 3.4 mmol/L — ABNORMAL LOW (ref 3.5–5.1)
Sodium: 140 mmol/L (ref 135–145)

## 2019-07-17 LAB — MAGNESIUM: Magnesium: 2.2 mg/dL (ref 1.7–2.4)

## 2019-07-17 MED ORDER — SODIUM CHLORIDE 0.9 % IV BOLUS: 500.0000 mL | Freq: Once | INTRAVENOUS | Status: DC

## 2019-07-17 MED ORDER — METOPROLOL TARTRATE 50 MG PO TABS: 50.0000 mg | ORAL_TABLET | Freq: Two times a day (BID) | ORAL | Status: AC

## 2019-07-17 MED ORDER — VITAMIN B-12 100 MCG PO TABS: 500.0000 ug | ORAL_TABLET | Freq: Every day | ORAL | Status: DC

## 2019-07-17 MED ORDER — AMLODIPINE BESYLATE 5 MG PO TABS: 5.0000 mg | ORAL_TABLET | Freq: Every day | ORAL | Status: AC

## 2019-07-17 MED ORDER — CLOPIDOGREL BISULFATE 75 MG PO TABS: 75.0000 mg | ORAL_TABLET | Freq: Every day | ORAL | Status: AC

## 2019-07-17 MED ORDER — PANTOPRAZOLE SODIUM 40 MG PO TBEC: 40.0000 mg | DELAYED_RELEASE_TABLET | Freq: Every day | ORAL | Status: AC | PRN

## 2019-07-17 MED ORDER — POTASSIUM CHLORIDE CRYS ER 20 MEQ PO TBCR
20.0000 meq | EXTENDED_RELEASE_TABLET | Freq: Once | ORAL | Status: AC
  Administered 2019-07-17: 20 meq via ORAL
  Filled 2019-07-17: qty 1

## 2019-07-17 MED ORDER — AMOXICILLIN-POT CLAVULANATE 875-125 MG PO TABS: 1.0000 | ORAL_TABLET | Freq: Two times a day (BID) | ORAL | Status: AC

## 2019-07-17 MED ORDER — CYANOCOBALAMIN 500 MCG PO TABS: 500.0000 ug | ORAL_TABLET | Freq: Every day | ORAL | Status: AC

## 2019-07-17 MED ORDER — ASPIRIN 81 MG PO TBEC: 81.0000 mg | DELAYED_RELEASE_TABLET | Freq: Every day | ORAL | 12 refills | Status: AC

## 2019-07-17 MED ORDER — SODIUM CHLORIDE 0.9 % IV BOLUS: 1000.0000 mL | Freq: Once | INTRAVENOUS | Status: DC

## 2019-07-17 MED ORDER — AMLODIPINE BESYLATE 5 MG PO TABS: 5.0000 mg | ORAL_TABLET | Freq: Every day | ORAL | Status: DC

## 2019-07-17 MED ORDER — ATORVASTATIN CALCIUM 40 MG PO TABS: 40.0000 mg | ORAL_TABLET | Freq: Every day | ORAL | Status: AC

## 2019-07-17 NOTE — TOC Transition Note (Signed)
Transition of Care [facility]) - CM/SW Discharge Note   Patient Details  Name: Pamela Vasquez MRN: [id number] Date of Birth: 1950/06/25  [facility]) CM/SW Contact:  Elliot Gault, LCSW Phone Number: 07/17/2019, 11:22 AM   Clinical Narrative:     Pt stable for dc to [facility] today. Authorization received from insurance. Updated Elease Hashimoto at [facility] and they would like pt to arrive around 1400. DC clinical will be sent electronically.  Updated pt's RN. EMS arranged for 1330. Voicemail message left for pt's son, Jill Alexanders, to update.  Report can be called to [phone].  There are no other TOC needs for dc.  Final next level of care: Skilled Nursing Facility Barriers to Discharge: Barriers Resolved   Patient Goals and CMS Choice Patient states their goals for this hospitalization and ongoing recovery are:: go to SNF and return home      Discharge Placement PASRR number recieved: 07/16/19            Patient chooses bed at: [facility]) Patient to be transferred to facility by: EMS Name of family member notified: Jill Alexanders Patient and family notified of of transfer: 07/17/19  Discharge Plan and Services                                     Social Determinants of Health (SDOH) Interventions     Readmission Risk Interventions No flowsheet data found.

## 2019-07-17 NOTE — Discharge Summary (Addendum)
Physician Discharge Summary  Pamela Vasquez ION:[id number] DOB: 21-Jul-1950 DOA: 07/12/2019  PCP: Patient, No Pcp Per  Admit date: 07/12/2019 Discharge date: 07/17/2019  Admitted From: Home Disposition:  SNF  Recommendations for Outpatient Follow-up:  1. Follow up with PCP in 1-2 weeks 2. Please obtain BMP/CBC in one week     Discharge Condition: Stable CODE STATUS: FULL Diet recommendation: Heart Healthy   Brief/Interim Summary: 69 year old female with a history of fibromyalgia, depression, hypertension presenting with a mechanical fall off her commode. The patient is a poor and difficult historian. Much of this history is obtained from review of medical record and speaking with the patient's son. Apparently, the patient fell off the commode and because of pain and weakness she was not able to get up off the floor. She is unable to tell me if she has had a period of generalized weakness or focal weakness prior to her fall. Regardless, her family tried to reach her by phone and was unable to speak with her. As result, her son came to check up on her and found her on the floor. According to the patient's son, the patient is able to ambulate and drive and perform her activities of daily living. Her son relates a very long history of depression and malingering type symptoms with regard to the patient. In fact, the patient's son relates a long history of attention seekingbehavior with regard to the patient. He states that the patient is usually noncompliant with medical care. He states that up until 2 days ago, she had not had any difficulty driving and getting around. Aside from some dental pain, the patient has not had any recent complaints. She denies any headache, chest pain, shortness breath, abdominal pain, nausea, vomiting, diarrhea. In the emergency department, the patient was afebrile hemodynamically stable with oxygen saturation 96% on room air. Her blood pressure was  209/121. BMP, LFTs, and CT of the brain were unremarkable. WBC 17.2, hemoglobin 16.3, platelets 249,000. X-rays of the lumbar, thoracic spine were negative. X-ray of the pelvis was negative. CPK was 1204.  She was fluid resuscitated with improvement of CK.  MRI brain showed R-ICA ischemic stroke.  Neurology was consulted and recommended DAPT.  PT recommended SNF with which the patient and son agreed.  Discharge Diagnoses:  Acute R-ACA Ischemic Stroke -she has left side neglect and dysphasia (slow responses)--currently at baseline -Serum B12--220 -Folic acid--12.2 -TSH--0.236 -Free T4--0.91 -07/12/2019 CT brain negative -Urine drug screen--neg -Appreciate Neurology Consult -PT/OT evaluation-->SNF -Speech therapy eval-->regular with thin -MRI brain--moderately large R-ACA acute infarct -MRA brain--no LVO; bilateral severe PCA stenosis -Carotid Duplex--no hemodynamically significant stenosis -Echo--EF 70-75%, G1DD, no PFO -LDL--233 -HbA1C--5.5 -Antiplatelet--ASA+Plavix -EEG--occasional generalized theta and delta slowing of unclear significance clinically. Otherwise, the recording is unrevealing  Leukocytosis -Suspect stress demargination -1/29 CXR personally reviewed--no infiltrates -UA--no pyuria -remains afebrile and hemodynamically stable  Rhabdomyolysis -CPK 1204 at the time of presentation -Continued IV fluids -Repeat [id number]  Hypokalemia -Repleted -Check magnesium--2.1  Hypertensive urgency -allowed permissive HTN initially -startedlow dose metoprolol--slowly increase dose-->50 mg bid -add amlodipine 5 mg daily after d/c  Mixed Hyperlipidemia -continue lipitor 40 mg daily  Hyperglycemia -Hemoglobin A1c--5.5  Tachycardia -likely stress induced -thyroid functions as above -echo as above -d-dimer--1.00 -CTA chest--neg PE;  Multinodular goiter; no pulmonary masses or consolidation -titrate up metoprolol 50 mg bid  Dental  caries -amox/clav x 1 week--5 more days after d/c   Discharge Instructions   Allergies as of 07/17/2019      Reactions  Aspirin Other (See Comments)   Stomach upset   Codeine Nausea And Vomiting   Demerol Nausea And Vomiting   Nsaids Nausea Only      Medication List    STOP taking these medications   omeprazole 20 MG tablet Commonly known as: PRILOSEC OTC     TAKE these medications   acetaminophen 500 MG tablet Commonly known as: TYLENOL Take 500 mg by mouth every 6 (six) hours as needed for mild pain or headache.   amLODipine 5 MG tablet Commonly known as: NORVASC Take 1 tablet (5 mg total) by mouth daily. Start taking on: July 18, 2019   amoxicillin-clavulanate 875-125 MG tablet Commonly known as: AUGMENTIN Take 1 tablet by mouth every 12 (twelve) hours. X 5 days   aspirin 81 MG EC tablet Commonly known as: Aspirin 81 Take 1 tablet (81 mg total) by mouth daily. Swallow whole.   atorvastatin 40 MG tablet Commonly known as: LIPITOR Take 1 tablet (40 mg total) by mouth daily at 6 PM.   clopidogrel 75 MG tablet Commonly known as: PLAVIX Take 1 tablet (75 mg total) by mouth daily with breakfast. Start taking on: July 18, 2019   metoprolol tartrate 50 MG tablet Commonly known as: LOPRESSOR Take 1 tablet (50 mg total) by mouth 2 (two) times daily.   pantoprazole 40 MG tablet Commonly known as: PROTONIX Take 1 tablet (40 mg total) by mouth daily as needed.   vitamin B-12 500 MCG tablet Commonly known as: CYANOCOBALAMIN Take 1 tablet (500 mcg total) by mouth daily. Start taking on: July 18, 2019      Contact information for after-discharge care    Destination    [facility] Preferred SNF .   Service: Skilled Nursing Contact information: [address] [place] [address] [phone]             Allergies  Allergen Reactions  . Aspirin Other (See Comments)    Stomach upset  .  Codeine Nausea And Vomiting  . Demerol Nausea And Vomiting  . Nsaids Nausea Only    Consultations:  neurology   Procedures/Studies: DG Thoracic Spine 2 View  Result Date: 07/12/2019 CLINICAL DATA:  Fall with back pain EXAM: THORACIC SPINE 2 VIEWS COMPARISON:  Chest radiograph dated 07/23/2015 FINDINGS: There is no evidence of thoracic spine fracture. Alignment is normal. Mild degenerative changes are seen in the thoracic spine. IMPRESSION: No evidence of acute fracture or subluxation. Electronically Signed   By: Romona Curls M.D.   On: 07/12/2019 19:32   DG Lumbar Spine Complete  Result Date: 07/12/2019 CLINICAL DATA:  Fall with back pain EXAM: LUMBAR SPINE - COMPLETE 4+ VIEW COMPARISON:  None. FINDINGS: There is no evidence of lumbar spine fracture. Alignment is normal. Mild degenerative disc and joint disease is seen in the lumbar spine. IMPRESSION: No evidence of acute fracture or subluxation. Electronically Signed   By: Romona Curls M.D.   On: 07/12/2019 19:30   DG Pelvis 1-2 Views  Result Date: 07/12/2019 CLINICAL DATA:  Fall with pain. EXAM: PELVIS - 1-2 VIEW COMPARISON:  None. FINDINGS: There is no evidence of pelvic fracture or diastasis. No pelvic bone lesions are seen. Mild degenerative changes are seen in the lumbar spine. IMPRESSION: No acute osseous injury. Electronically Signed   By: Romona Curls M.D.   On: 07/12/2019 19:32   CT HEAD WO CONTRAST  Result Date: 07/12/2019 CLINICAL DATA:  Ataxia left-sided weakness fall EXAM: CT HEAD  WITHOUT CONTRAST TECHNIQUE: Contiguous axial images were obtained from the base of the skull through the vertex without intravenous contrast. COMPARISON:  CT brain 01/26/2018 FINDINGS: Brain: No acute territorial infarction, hemorrhage, or intracranial mass is visualized. Patchy hypodensity in the white matter consistent with chronic small vessel ischemic change. CSF cystic density at the left basal ganglia without change, possible chronic infarct  versus choroidal fissure cyst, this is without change. Similar enlargement of the ventricular system. Vascular: No hyperdense vessels.  Carotid vascular calcification Skull: Normal. Negative for fracture or focal lesion. Sinuses/Orbits: Patchy mucosal thickening in the ethmoid sinuses Other: None IMPRESSION: 1. No CT evidence for acute intracranial abnormality. 2. Mild chronic small vessel ischemic change of the white matter. Electronically Signed   By: Jasmine Pang M.D.   On: 07/12/2019 23:23   CT ANGIO CHEST PE W OR WO CONTRAST  Result Date: 07/15/2019 CLINICAL DATA:  Chest pain and shortness of breath for several days. Elevated D-dimer. EXAM: CT ANGIOGRAPHY CHEST WITH CONTRAST TECHNIQUE: Multidetector CT imaging of the chest was performed using the standard protocol during bolus administration of intravenous contrast. Multiplanar CT image reconstructions and MIPs were obtained to evaluate the vascular anatomy. CONTRAST:  OMNIPAQUE IOHEXOL 350 MG/ML SOLN COMPARISON:  None. FINDINGS: Cardiovascular: Some motion artifact noted. Satisfactory opacification of pulmonary arteries noted, and no pulmonary emboli identified. No evidence of thoracic aortic dissection or aneurysm. Aortic and coronary artery atherosclerosis incidentally noted. Mediastinum/Nodes: Multinodular goiter is seen, with largest nodule in the left lobe measuring 2.2 cm. No pathologically enlarged lymph nodes identified. Lungs/Pleura: Motion artifact noted. Mild atelectasis or scarring is seen involving the inferior lingula and right lower lobe. No suspicious pulmonary nodules or masses identified. No evidence of pulmonary consolidation or pleural effusion. Upper abdomen: No acute findings. Musculoskeletal: No suspicious bone lesions identified. Review of the MIP images confirms the above findings. IMPRESSION: 1. No evidence of pulmonary embolism. 2. Mild atelectasis or scarring in lingula and right lower lobe. 3. Multinodular goiter, with  largest nodule in left lobe measuring 2.2 cm. Recommend thyroid ultrasound for further evaluation. (Ref: J Am Coll Radiol. 2015 Feb;12(2): 143-50). Aortic Atherosclerosis (ICD10-I70.0). Coronary artery atherosclerosis. Electronically Signed   By: Danae Orleans M.D.   On: 07/15/2019 14:04   MR ANGIO HEAD WO CONTRAST  Result Date: 07/13/2019 CLINICAL DATA:  Ataxia, stroke suspected. Left-sided weakness. Recent fall. EXAM: MRI HEAD WITHOUT CONTRAST MRA HEAD WITHOUT CONTRAST TECHNIQUE: Multiplanar, multiecho pulse sequences of the brain and surrounding structures were obtained without intravenous contrast. Angiographic images of the head were obtained using MRA technique without contrast. COMPARISON:  Head CT 07/12/2019 FINDINGS: MRI HEAD FINDINGS The study is mildly motion degraded. Brain: There is a moderately large acute distal right ACA infarct involving the parasagittal frontal and parietal lobes, cingulate gyrus, and body of the corpus callosum. There is no associated hemorrhage. T2 hyperintensities elsewhere in the cerebral white matter bilaterally are nonspecific but compatible with mild chronic small vessel ischemic disease. A 1 cm T2 hyperintensity inferiorly in the left basal ganglia/subinsular region was present on a 2013 head CT and may reflect a chronic lacunar infarct versus a dilated perivascular space or developmental cyst, with mild surrounding gliosis potentially favoring an old infarct. Ventriculomegaly is favored to reflect moderately advanced central predominant cerebral atrophy over hydrocephalus. A few chronic microhemorrhages are noted in the cerebellum bilaterally likely related to chronic hypertension. There is also a single chronic microhemorrhage in the medial left frontoparietal region. Vascular: Major intracranial vascular flow voids  are preserved. Skull and upper cervical spine: No suspicious marrow lesion. Sinuses/Orbits: Paranasal sinuses and mastoid air cells are clear. Unremarkable  orbits. Other: None. MRA HEAD FINDINGS The study is motion degraded including moderately severe motion through the ACA and MCA branches. The visualized distal vertebral arteries are patent to the basilar and codominant. Patent left PICA, right AICA, and bilateral SCA origins are visualized. The basilar artery is patent with mild stenosis versus artifact in its midportion. The PCAs are patent proximally with regions of signal loss involving the distal P1 and right greater than left P2 segments bilaterally suggesting severe stenoses although this may also be in part artifactual. The internal carotid arteries are widely patent from skull base to carotid termini. A1 and M1 segments are patent without evidence of flow limiting stenosis. No proximal branch occlusion is evident in the anterior circulation although branch vessel assessment is limited by motion. No aneurysm is identified. IMPRESSION: 1. Moderately large acute distal right ACA infarct. 2. Mild chronic small vessel ischemic disease. 3. Moderately advanced cerebral atrophy. 4. Motion degraded head MRA without evidence of large vessel occlusion or flow limiting proximal stenosis in the anterior circulation. 5. Potentially severe proximal PCA stenoses bilaterally. Electronically Signed   By: Sebastian Ache M.D.   On: 07/13/2019 10:56   MR BRAIN WO CONTRAST  Result Date: 07/13/2019 CLINICAL DATA:  Ataxia, stroke suspected. Left-sided weakness. Recent fall. EXAM: MRI HEAD WITHOUT CONTRAST MRA HEAD WITHOUT CONTRAST TECHNIQUE: Multiplanar, multiecho pulse sequences of the brain and surrounding structures were obtained without intravenous contrast. Angiographic images of the head were obtained using MRA technique without contrast. COMPARISON:  Head CT 07/12/2019 FINDINGS: MRI HEAD FINDINGS The study is mildly motion degraded. Brain: There is a moderately large acute distal right ACA infarct involving the parasagittal frontal and parietal lobes, cingulate gyrus, and  body of the corpus callosum. There is no associated hemorrhage. T2 hyperintensities elsewhere in the cerebral white matter bilaterally are nonspecific but compatible with mild chronic small vessel ischemic disease. A 1 cm T2 hyperintensity inferiorly in the left basal ganglia/subinsular region was present on a 2013 head CT and may reflect a chronic lacunar infarct versus a dilated perivascular space or developmental cyst, with mild surrounding gliosis potentially favoring an old infarct. Ventriculomegaly is favored to reflect moderately advanced central predominant cerebral atrophy over hydrocephalus. A few chronic microhemorrhages are noted in the cerebellum bilaterally likely related to chronic hypertension. There is also a single chronic microhemorrhage in the medial left frontoparietal region. Vascular: Major intracranial vascular flow voids are preserved. Skull and upper cervical spine: No suspicious marrow lesion. Sinuses/Orbits: Paranasal sinuses and mastoid air cells are clear. Unremarkable orbits. Other: None. MRA HEAD FINDINGS The study is motion degraded including moderately severe motion through the ACA and MCA branches. The visualized distal vertebral arteries are patent to the basilar and codominant. Patent left PICA, right AICA, and bilateral SCA origins are visualized. The basilar artery is patent with mild stenosis versus artifact in its midportion. The PCAs are patent proximally with regions of signal loss involving the distal P1 and right greater than left P2 segments bilaterally suggesting severe stenoses although this may also be in part artifactual. The internal carotid arteries are widely patent from skull base to carotid termini. A1 and M1 segments are patent without evidence of flow limiting stenosis. No proximal branch occlusion is evident in the anterior circulation although branch vessel assessment is limited by motion. No aneurysm is identified. IMPRESSION: 1. Moderately large acute  distal  right ACA infarct. 2. Mild chronic small vessel ischemic disease. 3. Moderately advanced cerebral atrophy. 4. Motion degraded head MRA without evidence of large vessel occlusion or flow limiting proximal stenosis in the anterior circulation. 5. Potentially severe proximal PCA stenoses bilaterally. Electronically Signed   By: Sebastian Ache M.D.   On: 07/13/2019 10:56   US Carotid Bilateral  Result Date: 07/13/2019 CLINICAL DATA:  69 year old female with a history of acute stroke EXAM: BILATERAL CAROTID DUPLEX ULTRASOUND TECHNIQUE: Wallace Cullens scale imaging, color Doppler and duplex ultrasound were performed of bilateral carotid and vertebral arteries in the neck. COMPARISON:  None. FINDINGS: Criteria: Quantification of carotid stenosis is based on velocity parameters that correlate the residual internal carotid diameter with NASCET-based stenosis levels, using the diameter of the distal internal carotid lumen as the denominator for stenosis measurement. The following velocity measurements were obtained: RIGHT ICA:  Systolic 81 cm/sec, Diastolic 12 cm/sec CCA:  161 cm/sec SYSTOLIC ICA/CCA RATIO:  0.5 ECA:  109 cm/sec LEFT ICA:  Systolic 112 cm/sec, Diastolic 18 cm/sec CCA:  134 cm/sec SYSTOLIC ICA/CCA RATIO:  0.8 ECA:  128 cm/sec Right Brachial SBP: Not acquired Left Brachial SBP: Not acquired RIGHT CAROTID ARTERY: No significant calcified disease of the right common carotid artery. Intermediate waveform maintained. Heterogeneous plaque without significant calcifications at the right carotid bifurcation. Low resistance waveform of the right ICA. No significant tortuosity. RIGHT VERTEBRAL ARTERY: Antegrade flow with low resistance waveform. LEFT CAROTID ARTERY: No significant calcified disease of the left common carotid artery. Intermediate waveform maintained. Heterogeneous plaque at the left carotid bifurcation without significant calcifications. Low resistance waveform of the left ICA. LEFT VERTEBRAL ARTERY:   Antegrade flow with low resistance waveform. IMPRESSION: Color duplex indicates minimal heterogeneous plaque, with no hemodynamically significant stenosis by duplex criteria in the extracranial cerebrovascular circulation. Signed, Yvone Neu. Reyne Dumas, RPVI [facility] Electronically Signed   By: Gilmer Mor D.O.   On: 07/13/2019 15:09   DG CHEST PORT 1 VIEW  Result Date: 07/13/2019 CLINICAL DATA:  Shortness of breath.  Confusion. EXAM: PORTABLE CHEST 1 VIEW COMPARISON:  July 23, 2015 FINDINGS: Lungs are clear. Heart size and pulmonary vascularity are within normal limits. No adenopathy. No bone lesions. IMPRESSION: Lungs clear.  Cardiac silhouette normal.  No adenopathy. Electronically Signed   By: Bretta Bang III M.D.   On: 07/13/2019 08:22   EEG adult  Result Date: 07/16/2019 Beryle Beams, MD     07/16/2019  6:03 PM [facility] Kofi A. Gerilyn Pilgrim, MD     [email or web address]       HISTORY: This is a 69 year old female who presents with altered mental status that is concerning for complex partial seizures. MEDICATIONS: Current Facility-Administered Medications: .  acetaminophen (TYLENOL) tablet 650 mg, 650 mg, Oral, Q6H PRN, 650 mg at 07/14/19 1153 **OR** acetaminophen (TYLENOL) suppository 650 mg, 650 mg, Rectal, Q6H PRN, Pearson Grippe, MD .  amoxicillin-clavulanate (AUGMENTIN) 875-125 MG per tablet 1 tablet, 1 tablet, Oral, Q12H, Catarina Hartshorn, MD, 1 tablet at 07/16/19 0817 .  aspirin EC tablet 325 mg, 325 mg, Oral, Daily, Pinki Rottman, MD, 325 mg at 07/16/19 0817 .  atorvastatin (LIPITOR) tablet 40 mg, 40 mg, Oral, q1800, Jeydan Barner, MD, 40 mg at 07/15/19 1710 .  clopidogrel (PLAVIX) tablet 75 mg, 75 mg, Oral, Q breakfast, [place], Kofi, MD, 75 mg at 07/16/19 0817 .  enoxaparin (LOVENOX) injection 40 mg, 40 mg, Subcutaneous, Q24H, Pearson Grippe, MD, 40 mg at 07/15/19 2041 .  hydrALAZINE (APRESOLINE) injection 10 mg, 10 mg, Intravenous,  Q6H PRN, Rosan Calbert, MD .  metoprolol tartrate (LOPRESSOR) tablet 50 mg, 50 mg, Oral, BID, Coriann Brouhard, MD, 50 mg at 07/16/19 0817 .  pantoprazole (PROTONIX) EC tablet 40 mg, 40 mg, Oral, Daily PRN, Darrin Apodaca, MD ANALYSIS: A 16 channel recording using standard 10 20 measurements is conducted for 23 minutes.  There is a well-formed posterior dominant rhythm of 8 hertz which attenuates with eye opening. There is beta activity observed in the frontal areas. Awake and drowsy architecture are noted. There are occasional bitemporal theta slowing that is synchronous although sometimes this slowing also occurs unilaterally. There are infrequent generalized delta slowing observed. Photic stimulation and hyperventilation are not conducted. There is no focal or lateralized slowing. There is no epileptiform activity is noted. IMPRESSION: 1.  The recording shows occasional generalized theta and delta slowing of unclear significance clinically. Otherwise, the recording is unrevealing. Kofi A. Gerilyn Pilgrim, M.D. Diplomate, Biomedical engineer of Psychiatry and Neurology ( Neurology).   ECHOCARDIOGRAM COMPLETE  Result Date: 07/13/2019   ECHOCARDIOGRAM REPORT   Patient Name:   BRAILYN KILLION Date of Exam: 07/13/2019 Medical Rec #:  [id number]       Height:       64.0 in Accession #:    [id number]      Weight:       164.2 lb Date of Birth:  1950/11/08      BSA:          1.80 m Patient Age:    68 years        BP:           164/92 mmHg Patient Gender: F               HR:           106 bpm. [facility] Procedure: 2D Echo, Cardiac Doppler and Color Doppler Indications:    Stroke 434.91 / I163.9  History:        Patient has prior history of Echocardiogram examinations, most                 recent 03/06/2013. Risk Factors:Hypertension and Dyslipidemia.                 Acute ischemic stroke,Left hemiparesis.  Sonographer:    Celesta Gentile RCS Referring Phys: [phone] Overton Boggus  Sonographer Comments: NOTE: Patient has no I.V. access at  this time. Patient pulled out prior to my arrival. IMPRESSIONS  1. Left ventricular ejection fraction, by visual estimation, is 70 to 75%. The left ventricle has hyperdynamic function. There is mildly increased left ventricular hypertrophy.  2. Left ventricular diastolic parameters are consistent with Grade I diastolic dysfunction (impaired relaxation).  3. The left ventricle has no regional wall motion abnormalities.  4. Global right ventricle has hyperdynamic systolic function.The right ventricular size is normal. Right vetricular wall thickness was not assessed.  5. Left atrial size was normal.  6. Right atrial size was normal.  7. Moderate mitral annular calcification.  8. The mitral valve is grossly normal. No evidence of mitral valve regurgitation.  9. The tricuspid valve is not well visualized. 10. The tricuspid valve is not well visualized. Tricuspid valve regurgitation is not demonstrated. 11. The aortic valve was not well visualized. Aortic valve regurgitation is not visualized. No evidence of aortic valve sclerosis or stenosis. 12. The pulmonic valve was not well visualized. Pulmonic valve regurgitation is not visualized. FINDINGS  Left Ventricle: Left ventricular ejection fraction, by visual estimation, is 70 to 75%. The left ventricle has hyperdynamic function. The left ventricle has no regional wall motion abnormalities. The left ventricular internal cavity size was the left ventricle is normal in size. There is mildly increased left ventricular hypertrophy. Concentric left ventricular hypertrophy. Left ventricular diastolic parameters are consistent with Grade I diastolic dysfunction (impaired relaxation). Normal left atrial pressure. Right Ventricle: The right ventricular size is normal. Right vetricular wall thickness was not assessed. Global RV systolic function is has hyperdynamic systolic function. Left Atrium: Left atrial size was normal in size. Right Atrium: Right atrial size was normal in  size Pericardium: There is no evidence of pericardial effusion. Mitral Valve: The mitral valve is grossly normal. Moderate mitral annular calcification. No evidence of mitral valve regurgitation. Tricuspid Valve: The tricuspid valve is not well visualized. Tricuspid valve regurgitation is not demonstrated. Aortic Valve: The aortic valve was not well visualized. Aortic valve regurgitation is not visualized. The aortic valve is structurally normal, with no evidence of sclerosis or stenosis. Mild to moderate aortic valve annular calcification. Pulmonic Valve: The pulmonic valve was not well visualized. Pulmonic valve regurgitation is not visualized. Pulmonic regurgitation is not visualized. Aorta: The aortic root is normal in size and structure. Venous: The inferior vena cava was not well visualized. IAS/Shunts: No atrial level shunt detected by color flow Doppler.  LEFT VENTRICLE PLAX 2D LVIDd:         3.69 cm  Diastology LVIDs:         1.91 cm  LV e' lateral:   8.16 cm/s LV PW:         1.13 cm  LV E/e' lateral: 8.0 LV IVS:        1.13 cm  LV e' medial:    8.16 cm/s LVOT diam:     1.70 cm  LV E/e' medial:  8.0 LV SV:         46 ml LV SV Index:   25.03 LVOT Area:     2.27 cm  RIGHT VENTRICLE RV S prime:     17.60 cm/s TAPSE (M-mode): 1.5 cm LEFT ATRIUM           Index       RIGHT ATRIUM          Index LA diam:      2.50 cm 1.39 cm/m  RA Area:     8.30 cm LA Vol (A2C): 13.1 ml 7.28 ml/m  RA Volume:   13.50 ml 7.50 ml/m LA Vol (A4C): 18.4 ml 10.23 ml/m  AORTIC VALVE LVOT Vmax:   137.00 cm/s LVOT Vmean:  96.000 cm/s LVOT VTI:    0.229 m  AORTA Ao Root diam: 3.00 cm MITRAL VALVE MV Area (PHT): 3.17 cm              SHUNTS MV PHT:        69.31 msec            Systemic VTI:  0.23 m MV Decel Time: 239 msec              Systemic Diam: 1.70 cm MV E velocity: 65.60 cm/s  103 cm/s MV A velocity: 114.00 cm/s 70.3 cm/s MV E/A ratio:  0.58        1.5  Prentice Docker MD Electronically signed by Prentice Docker MD Signature  Date/Time: 07/13/2019/4:28:04 PM    Final         Discharge Exam: Vitals:  07/16/19 2155 07/17/19 0512  BP: (!) 166/113 (!) 144/92  Pulse: (!) 107 80  Resp: 17 17  Temp: 98.7 F (37.1 C) 98.3 F (36.8 C)  SpO2: 93% 92%   Vitals:   07/16/19 1336 07/16/19 1400 07/16/19 2155 07/17/19 0512  BP: (!) 179/105 (!) 175/108 (!) 166/113 (!) 144/92  Pulse: 89 95 (!) 107 80  Resp: Temp: 98 F (36.7 C) 98 F (36.7 C) 98.7 F (37.1 C) 98.3 F (36.8 C)  TempSrc: Oral Oral Oral Oral  SpO2: 93% 95% 93% 92%  Weight:    70.9 kg  Height:        General: Pt is alert, awake, not in acute distress Cardiovascular: RRR, S1/S2 +, no rubs, no gallops Respiratory: bibasilar crackles. No wheeze Abdominal: Soft, NT, ND, bowel sounds + Extremities: no edema, no cyanosis   The results of significant diagnostics from this hospitalization (including imaging, microbiology, ancillary and laboratory) are listed below for reference.    Significant Diagnostic Studies: DG Thoracic Spine 2 View  Result Date: 07/12/2019 CLINICAL DATA:  Fall with back pain EXAM: THORACIC SPINE 2 VIEWS COMPARISON:  Chest radiograph dated 07/23/2015 FINDINGS: There is no evidence of thoracic spine fracture. Alignment is normal. Mild degenerative changes are seen in the thoracic spine. IMPRESSION: No evidence of acute fracture or subluxation. Electronically Signed   By: Romona Curls M.D.   On: 07/12/2019 19:32   DG Lumbar Spine Complete  Result Date: 07/12/2019 CLINICAL DATA:  Fall with back pain EXAM: LUMBAR SPINE - COMPLETE 4+ VIEW COMPARISON:  None. FINDINGS: There is no evidence of lumbar spine fracture. Alignment is normal. Mild degenerative disc and joint disease is seen in the lumbar spine. IMPRESSION: No evidence of acute fracture or subluxation. Electronically Signed   By: Romona Curls M.D.   On: 07/12/2019 19:30   DG Pelvis 1-2 Views  Result Date: 07/12/2019 CLINICAL DATA:  Fall with pain. EXAM:  PELVIS - 1-2 VIEW COMPARISON:  None. FINDINGS: There is no evidence of pelvic fracture or diastasis. No pelvic bone lesions are seen. Mild degenerative changes are seen in the lumbar spine. IMPRESSION: No acute osseous injury. Electronically Signed   By: Romona Curls M.D.   On: 07/12/2019 19:32   CT HEAD WO CONTRAST  Result Date: 07/12/2019 CLINICAL DATA:  Ataxia left-sided weakness fall EXAM: CT HEAD WITHOUT CONTRAST TECHNIQUE: Contiguous axial images were obtained from the base of the skull through the vertex without intravenous contrast. COMPARISON:  CT brain 01/26/2018 FINDINGS: Brain: No acute territorial infarction, hemorrhage, or intracranial mass is visualized. Patchy hypodensity in the white matter consistent with chronic small vessel ischemic change. CSF cystic density at the left basal ganglia without change, possible chronic infarct versus choroidal fissure cyst, this is without change. Similar enlargement of the ventricular system. Vascular: No hyperdense vessels.  Carotid vascular calcification Skull: Normal. Negative for fracture or focal lesion. Sinuses/Orbits: Patchy mucosal thickening in the ethmoid sinuses Other: None IMPRESSION: 1. No CT evidence for acute intracranial abnormality. 2. Mild chronic small vessel ischemic change of the white matter. Electronically Signed   By: Jasmine Pang M.D.   On: 07/12/2019 23:23   CT ANGIO CHEST PE W OR WO CONTRAST  Result Date: 07/15/2019 CLINICAL DATA:  Chest pain and shortness of breath for several days. Elevated D-dimer. EXAM: CT ANGIOGRAPHY CHEST WITH CONTRAST TECHNIQUE: Multidetector CT imaging of the chest was performed using the standard protocol during bolus administration of intravenous contrast. Multiplanar CT  image reconstructions and MIPs were obtained to evaluate the vascular anatomy. CONTRAST:  OMNIPAQUE IOHEXOL 350 MG/ML SOLN COMPARISON:  None. FINDINGS: Cardiovascular: Some motion artifact noted. Satisfactory opacification of  pulmonary arteries noted, and no pulmonary emboli identified. No evidence of thoracic aortic dissection or aneurysm. Aortic and coronary artery atherosclerosis incidentally noted. Mediastinum/Nodes: Multinodular goiter is seen, with largest nodule in the left lobe measuring 2.2 cm. No pathologically enlarged lymph nodes identified. Lungs/Pleura: Motion artifact noted. Mild atelectasis or scarring is seen involving the inferior lingula and right lower lobe. No suspicious pulmonary nodules or masses identified. No evidence of pulmonary consolidation or pleural effusion. Upper abdomen: No acute findings. Musculoskeletal: No suspicious bone lesions identified. Review of the MIP images confirms the above findings. IMPRESSION: 1. No evidence of pulmonary embolism. 2. Mild atelectasis or scarring in lingula and right lower lobe. 3. Multinodular goiter, with largest nodule in left lobe measuring 2.2 cm. Recommend thyroid ultrasound for further evaluation. (Ref: J Am Coll Radiol. 2015 Feb;12(2): 143-50). Aortic Atherosclerosis (ICD10-I70.0). Coronary artery atherosclerosis. Electronically Signed   By: Danae Orleans M.D.   On: 07/15/2019 14:04   MR ANGIO HEAD WO CONTRAST  Result Date: 07/13/2019 CLINICAL DATA:  Ataxia, stroke suspected. Left-sided weakness. Recent fall. EXAM: MRI HEAD WITHOUT CONTRAST MRA HEAD WITHOUT CONTRAST TECHNIQUE: Multiplanar, multiecho pulse sequences of the brain and surrounding structures were obtained without intravenous contrast. Angiographic images of the head were obtained using MRA technique without contrast. COMPARISON:  Head CT 07/12/2019 FINDINGS: MRI HEAD FINDINGS The study is mildly motion degraded. Brain: There is a moderately large acute distal right ACA infarct involving the parasagittal frontal and parietal lobes, cingulate gyrus, and body of the corpus callosum. There is no associated hemorrhage. T2 hyperintensities elsewhere in the cerebral white matter bilaterally are nonspecific  but compatible with mild chronic small vessel ischemic disease. A 1 cm T2 hyperintensity inferiorly in the left basal ganglia/subinsular region was present on a 2013 head CT and may reflect a chronic lacunar infarct versus a dilated perivascular space or developmental cyst, with mild surrounding gliosis potentially favoring an old infarct. Ventriculomegaly is favored to reflect moderately advanced central predominant cerebral atrophy over hydrocephalus. A few chronic microhemorrhages are noted in the cerebellum bilaterally likely related to chronic hypertension. There is also a single chronic microhemorrhage in the medial left frontoparietal region. Vascular: Major intracranial vascular flow voids are preserved. Skull and upper cervical spine: No suspicious marrow lesion. Sinuses/Orbits: Paranasal sinuses and mastoid air cells are clear. Unremarkable orbits. Other: None. MRA HEAD FINDINGS The study is motion degraded including moderately severe motion through the ACA and MCA branches. The visualized distal vertebral arteries are patent to the basilar and codominant. Patent left PICA, right AICA, and bilateral SCA origins are visualized. The basilar artery is patent with mild stenosis versus artifact in its midportion. The PCAs are patent proximally with regions of signal loss involving the distal P1 and right greater than left P2 segments bilaterally suggesting severe stenoses although this may also be in part artifactual. The internal carotid arteries are widely patent from skull base to carotid termini. A1 and M1 segments are patent without evidence of flow limiting stenosis. No proximal branch occlusion is evident in the anterior circulation although branch vessel assessment is limited by motion. No aneurysm is identified. IMPRESSION: 1. Moderately large acute distal right ACA infarct. 2. Mild chronic small vessel ischemic disease. 3. Moderately advanced cerebral atrophy. 4. Motion degraded head MRA without  evidence of large vessel occlusion or  flow limiting proximal stenosis in the anterior circulation. 5. Potentially severe proximal PCA stenoses bilaterally. Electronically Signed   By: Sebastian Ache M.D.   On: 07/13/2019 10:56   MR BRAIN WO CONTRAST  Result Date: 07/13/2019 CLINICAL DATA:  Ataxia, stroke suspected. Left-sided weakness. Recent fall. EXAM: MRI HEAD WITHOUT CONTRAST MRA HEAD WITHOUT CONTRAST TECHNIQUE: Multiplanar, multiecho pulse sequences of the brain and surrounding structures were obtained without intravenous contrast. Angiographic images of the head were obtained using MRA technique without contrast. COMPARISON:  Head CT 07/12/2019 FINDINGS: MRI HEAD FINDINGS The study is mildly motion degraded. Brain: There is a moderately large acute distal right ACA infarct involving the parasagittal frontal and parietal lobes, cingulate gyrus, and body of the corpus callosum. There is no associated hemorrhage. T2 hyperintensities elsewhere in the cerebral white matter bilaterally are nonspecific but compatible with mild chronic small vessel ischemic disease. A 1 cm T2 hyperintensity inferiorly in the left basal ganglia/subinsular region was present on a 2013 head CT and may reflect a chronic lacunar infarct versus a dilated perivascular space or developmental cyst, with mild surrounding gliosis potentially favoring an old infarct. Ventriculomegaly is favored to reflect moderately advanced central predominant cerebral atrophy over hydrocephalus. A few chronic microhemorrhages are noted in the cerebellum bilaterally likely related to chronic hypertension. There is also a single chronic microhemorrhage in the medial left frontoparietal region. Vascular: Major intracranial vascular flow voids are preserved. Skull and upper cervical spine: No suspicious marrow lesion. Sinuses/Orbits: Paranasal sinuses and mastoid air cells are clear. Unremarkable orbits. Other: None. MRA HEAD FINDINGS The study is motion degraded  including moderately severe motion through the ACA and MCA branches. The visualized distal vertebral arteries are patent to the basilar and codominant. Patent left PICA, right AICA, and bilateral SCA origins are visualized. The basilar artery is patent with mild stenosis versus artifact in its midportion. The PCAs are patent proximally with regions of signal loss involving the distal P1 and right greater than left P2 segments bilaterally suggesting severe stenoses although this may also be in part artifactual. The internal carotid arteries are widely patent from skull base to carotid termini. A1 and M1 segments are patent without evidence of flow limiting stenosis. No proximal branch occlusion is evident in the anterior circulation although branch vessel assessment is limited by motion. No aneurysm is identified. IMPRESSION: 1. Moderately large acute distal right ACA infarct. 2. Mild chronic small vessel ischemic disease. 3. Moderately advanced cerebral atrophy. 4. Motion degraded head MRA without evidence of large vessel occlusion or flow limiting proximal stenosis in the anterior circulation. 5. Potentially severe proximal PCA stenoses bilaterally. Electronically Signed   By: Sebastian Ache M.D.   On: 07/13/2019 10:56   US Carotid Bilateral  Result Date: 07/13/2019 CLINICAL DATA:  69 year old female with a history of acute stroke EXAM: BILATERAL CAROTID DUPLEX ULTRASOUND TECHNIQUE: Wallace Cullens scale imaging, color Doppler and duplex ultrasound were performed of bilateral carotid and vertebral arteries in the neck. COMPARISON:  None. FINDINGS: Criteria: Quantification of carotid stenosis is based on velocity parameters that correlate the residual internal carotid diameter with NASCET-based stenosis levels, using the diameter of the distal internal carotid lumen as the denominator for stenosis measurement. The following velocity measurements were obtained: RIGHT ICA:  Systolic 81 cm/sec, Diastolic 12 cm/sec CCA:  161  cm/sec SYSTOLIC ICA/CCA RATIO:  0.5 ECA:  109 cm/sec LEFT ICA:  Systolic 112 cm/sec, Diastolic 18 cm/sec CCA:  134 cm/sec SYSTOLIC ICA/CCA RATIO:  0.8 ECA:  128 cm/sec Right  Brachial SBP: Not acquired Left Brachial SBP: Not acquired RIGHT CAROTID ARTERY: No significant calcified disease of the right common carotid artery. Intermediate waveform maintained. Heterogeneous plaque without significant calcifications at the right carotid bifurcation. Low resistance waveform of the right ICA. No significant tortuosity. RIGHT VERTEBRAL ARTERY: Antegrade flow with low resistance waveform. LEFT CAROTID ARTERY: No significant calcified disease of the left common carotid artery. Intermediate waveform maintained. Heterogeneous plaque at the left carotid bifurcation without significant calcifications. Low resistance waveform of the left ICA. LEFT VERTEBRAL ARTERY:  Antegrade flow with low resistance waveform. IMPRESSION: Color duplex indicates minimal heterogeneous plaque, with no hemodynamically significant stenosis by duplex criteria in the extracranial cerebrovascular circulation. Signed, Yvone Neu. Reyne Dumas, RPVI [facility] Electronically Signed   By: Gilmer Mor D.O.   On: 07/13/2019 15:09   DG CHEST PORT 1 VIEW  Result Date: 07/13/2019 CLINICAL DATA:  Shortness of breath.  Confusion. EXAM: PORTABLE CHEST 1 VIEW COMPARISON:  July 23, 2015 FINDINGS: Lungs are clear. Heart size and pulmonary vascularity are within normal limits. No adenopathy. No bone lesions. IMPRESSION: Lungs clear.  Cardiac silhouette normal.  No adenopathy. Electronically Signed   By: Bretta Bang III M.D.   On: 07/13/2019 08:22   EEG adult  Result Date: 07/16/2019 Beryle Beams, MD     07/16/2019  6:03 PM [facility] Kofi A. Gerilyn Pilgrim, MD     [email or web address]       HISTORY: This is a 69 year old female who presents with altered mental status that is concerning for  complex partial seizures. MEDICATIONS: Current Facility-Administered Medications: .  acetaminophen (TYLENOL) tablet 650 mg, 650 mg, Oral, Q6H PRN, 650 mg at 07/14/19 1153 **OR** acetaminophen (TYLENOL) suppository 650 mg, 650 mg, Rectal, Q6H PRN, Pearson Grippe, MD .  amoxicillin-clavulanate (AUGMENTIN) 875-125 MG per tablet 1 tablet, 1 tablet, Oral, Q12H, Catarina Hartshorn, MD, 1 tablet at 07/16/19 0817 .  aspirin EC tablet 325 mg, 325 mg, Oral, Daily, Taliyah Watrous, MD, 325 mg at 07/16/19 0817 .  atorvastatin (LIPITOR) tablet 40 mg, 40 mg, Oral, q1800, Willam Munford, MD, 40 mg at 07/15/19 1710 .  clopidogrel (PLAVIX) tablet 75 mg, 75 mg, Oral, Q breakfast, [place], Kofi, MD, 75 mg at 07/16/19 0817 .  enoxaparin (LOVENOX) injection 40 mg, 40 mg, Subcutaneous, Q24H, Pearson Grippe, MD, 40 mg at 07/15/19 2041 .  hydrALAZINE (APRESOLINE) injection 10 mg, 10 mg, Intravenous, Q6H PRN, Laureen Frederic, MD .  metoprolol tartrate (LOPRESSOR) tablet 50 mg, 50 mg, Oral, BID, Ileta Ofarrell, MD, 50 mg at 07/16/19 0817 .  pantoprazole (PROTONIX) EC tablet 40 mg, 40 mg, Oral, Daily PRN, Dondi Aime, MD ANALYSIS: A 16 channel recording using standard 10 20 measurements is conducted for 23 minutes.  There is a well-formed posterior dominant rhythm of 8 hertz which attenuates with eye opening. There is beta activity observed in the frontal areas. Awake and drowsy architecture are noted. There are occasional bitemporal theta slowing that is synchronous although sometimes this slowing also occurs unilaterally. There are infrequent generalized delta slowing observed. Photic stimulation and hyperventilation are not conducted. There is no focal or lateralized slowing. There is no epileptiform activity is noted. IMPRESSION: 1.  The recording shows occasional generalized theta and delta slowing of unclear significance clinically. Otherwise, the recording is unrevealing. Kofi A. Gerilyn Pilgrim, M.D. Diplomate, Biomedical engineer of Psychiatry and Neurology ( Neurology).    ECHOCARDIOGRAM COMPLETE  Result Date: 07/13/2019   ECHOCARDIOGRAM REPORT   Patient Name:  Pamela Vasquez Date of Exam: 07/13/2019 Medical Rec #:  [id number]       Height:       64.0 in Accession #:    [id number]      Weight:       164.2 lb Date of Birth:  [birth year]      BSA:          1.80 m Patient Age:    68 years        BP:           164/92 mmHg Patient Gender: F               HR:           106 bpm. [facility] Procedure: 2D Echo, Cardiac Doppler and Color Doppler Indications:    Stroke 434.91 / I163.9  History:        Patient has prior history of Echocardiogram examinations, most                 recent 03/06/2013. Risk Factors:Hypertension and Dyslipidemia.                 Acute ischemic stroke,Left hemiparesis.  Sonographer:    Celesta Gentile RCS Referring Phys: [id number] Godwin Tedesco  Sonographer Comments: NOTE: Patient has no I.V. access at this time. Patient pulled out prior to my arrival. IMPRESSIONS  1. Left ventricular ejection fraction, by visual estimation, is 70 to 75%. The left ventricle has hyperdynamic function. There is mildly increased left ventricular hypertrophy.  2. Left ventricular diastolic parameters are consistent with Grade I diastolic dysfunction (impaired relaxation).  3. The left ventricle has no regional wall motion abnormalities.  4. Global right ventricle has hyperdynamic systolic function.The right ventricular size is normal. Right vetricular wall thickness was not assessed.  5. Left atrial size was normal.  6. Right atrial size was normal.  7. Moderate mitral annular calcification.  8. The mitral valve is grossly normal. No evidence of mitral valve regurgitation.  9. The tricuspid valve is not well visualized. 10. The tricuspid valve is not well visualized. Tricuspid valve regurgitation is not demonstrated. 11. The aortic valve was not well visualized. Aortic valve regurgitation is not visualized. No evidence of aortic valve sclerosis or stenosis. 12. The pulmonic valve  was not well visualized. Pulmonic valve regurgitation is not visualized. FINDINGS  Left Ventricle: Left ventricular ejection fraction, by visual estimation, is 70 to 75%. The left ventricle has hyperdynamic function. The left ventricle has no regional wall motion abnormalities. The left ventricular internal cavity size was the left ventricle is normal in size. There is mildly increased left ventricular hypertrophy. Concentric left ventricular hypertrophy. Left ventricular diastolic parameters are consistent with Grade I diastolic dysfunction (impaired relaxation). Normal left atrial pressure. Right Ventricle: The right ventricular size is normal. Right vetricular wall thickness was not assessed. Global RV systolic function is has hyperdynamic systolic function. Left Atrium: Left atrial size was normal in size. Right Atrium: Right atrial size was normal in size Pericardium: There is no evidence of pericardial effusion. Mitral Valve: The mitral valve is grossly normal. Moderate mitral annular calcification. No evidence of mitral valve regurgitation. Tricuspid Valve: The tricuspid valve is not well visualized. Tricuspid valve regurgitation is not demonstrated. Aortic Valve: The aortic valve was not well visualized. Aortic valve regurgitation is not visualized. The aortic valve is structurally normal, with no evidence of sclerosis or stenosis. Mild to moderate aortic valve annular calcification. Pulmonic Valve: The pulmonic  valve was not well visualized. Pulmonic valve regurgitation is not visualized. Pulmonic regurgitation is not visualized. Aorta: The aortic root is normal in size and structure. Venous: The inferior vena cava was not well visualized. IAS/Shunts: No atrial level shunt detected by color flow Doppler.  LEFT VENTRICLE PLAX 2D LVIDd:         3.69 cm  Diastology LVIDs:         1.91 cm  LV e' lateral:   8.16 cm/s LV PW:         1.13 cm  LV E/e' lateral: 8.0 LV IVS:        1.13 cm  LV e' medial:    8.16 cm/s  LVOT diam:     1.70 cm  LV E/e' medial:  8.0 LV SV:         46 ml LV SV Index:   25.03 LVOT Area:     2.27 cm  RIGHT VENTRICLE RV S prime:     17.60 cm/s TAPSE (M-mode): 1.5 cm LEFT ATRIUM           Index       RIGHT ATRIUM          Index LA diam:      2.50 cm 1.39 cm/m  RA Area:     8.30 cm LA Vol (A2C): 13.1 ml 7.28 ml/m  RA Volume:   13.50 ml 7.50 ml/m LA Vol (A4C): 18.4 ml 10.23 ml/m  AORTIC VALVE LVOT Vmax:   137.00 cm/s LVOT Vmean:  96.000 cm/s LVOT VTI:    0.229 m  AORTA Ao Root diam: 3.00 cm MITRAL VALVE MV Area (PHT): 3.17 cm              SHUNTS MV PHT:        69.31 msec            Systemic VTI:  0.23 m MV Decel Time: 239 msec              Systemic Diam: 1.70 cm MV E velocity: 65.60 cm/s  103 cm/s MV A velocity: 114.00 cm/s 70.3 cm/s MV E/A ratio:  0.58        1.5  Prentice Docker MD Electronically signed by Prentice Docker MD Signature Date/Time: 07/13/2019/4:28:04 PM    Final      Microbiology: Recent Results (from the past 240 hour(s))  Respiratory Panel by RT PCR (Flu A&B, Covid) - Nasopharyngeal Swab     Status: None   Collection Time: 07/12/19  9:27 PM   Specimen: Nasopharyngeal Swab  Result Value Ref Range Status   SARS Coronavirus 2 by RT PCR NEGATIVE NEGATIVE Final    Comment: (NOTE) SARS-CoV-2 target nucleic acids are NOT DETECTED. The SARS-CoV-2 RNA is generally detectable in upper respiratoy specimens during the acute phase of infection. The lowest concentration of SARS-CoV-2 viral copies this assay can detect is 131 copies/mL. A negative result does not preclude SARS-Cov-2 infection and should not be used as the sole basis for treatment or other patient management decisions. A negative result may occur with  improper specimen collection/handling, submission of specimen other than nasopharyngeal swab, presence of viral mutation(s) within the areas targeted by this assay, and inadequate number of viral copies (<131 copies/mL). A negative result must be combined  with clinical observations, patient history, and epidemiological information. The expected result is Negative. Fact Sheet for Patients:  https://www.moore.com/ Fact Sheet for Healthcare Providers:  https://www.young.biz/ This test is not yet ap proved or cleared by  the [employer] and  has been authorized for detection and/or diagnosis of SARS-CoV-2 by FDA under an Emergency Use Authorization (EUA). This EUA will remain  in effect (meaning this test can be used) for the duration of the COVID-19 declaration under Section 564(b)(1) of the Act, 21 U.S.C. section 360bbb-3(b)(1), unless the authorization is terminated or revoked sooner.    Influenza A by PCR NEGATIVE NEGATIVE Final   Influenza B by PCR NEGATIVE NEGATIVE Final    Comment: (NOTE) The Xpert Xpress SARS-CoV-2/FLU/RSV assay is intended as an aid in  the diagnosis of influenza from Nasopharyngeal swab specimens and  should not be used as a sole basis for treatment. Nasal washings and  aspirates are unacceptable for Xpert Xpress SARS-CoV-2/FLU/RSV  testing. Fact Sheet for Patients: https://www.moore.com/ Fact Sheet for Healthcare Providers: https://www.young.biz/ This test is not yet approved or cleared by the [place] FDA and  has been authorized for detection and/or diagnosis of SARS-CoV-2 by  FDA under an Emergency Use Authorization (EUA). This EUA will remain  in effect (meaning this test can be used) for the duration of the  Covid-19 declaration under Section 564(b)(1) of the Act, 21  U.S.C. section 360bbb-3(b)(1), unless the authorization is  terminated or revoked. Performed at [facility], [address], [place], [place] [address]   Culture, Urine     Status: None   Collection Time: 07/13/19 11:22 AM   Specimen: Urine, Clean Catch  Result Value Ref Range Status   Specimen Description   Final    URINE, CLEAN CATCH Performed at  [facility], [address], [place], [place] [address]    Special Requests   Final    NONE Performed at [facility], [address], [place], [place] [address]    Culture   Final    NO GROWTH Performed at [facility], [address], [place], [place] [address]    Report Status 07/14/2019 FINAL  Final  SARS CORONAVIRUS 2 (Kayla Deshaies 6-24 HRS) Nasopharyngeal Nasopharyngeal Swab     Status: None   Collection Time: 07/16/19 10:14 AM   Specimen: Nasopharyngeal Swab  Result Value Ref Range Status   SARS Coronavirus 2 NEGATIVE NEGATIVE Final    Comment: (NOTE) SARS-CoV-2 target nucleic acids are NOT DETECTED. The SARS-CoV-2 RNA is generally detectable in upper and lower respiratory specimens during the acute phase of infection. Negative results do not preclude SARS-CoV-2 infection, do not rule out co-infections with other pathogens, and should not be used as the sole basis for treatment or other patient management decisions. Negative results must be combined with clinical observations, patient history, and epidemiological information. The expected result is Negative. Fact Sheet for Patients: HairSlick.no Fact Sheet for Healthcare Providers: quierodirigir.com This test is not yet approved or cleared by the [place] FDA and  has been authorized for detection and/or diagnosis of SARS-CoV-2 by FDA under an Emergency Use Authorization (EUA). This EUA will remain  in effect (meaning this test can be used) for the duration of the COVID-19 declaration under Section 56 4(b)(1) of the Act, 21 U.S.C. section 360bbb-3(b)(1), unless the authorization is terminated or revoked sooner. Performed at [facility], [address] [place], [place] [address]      Labs: Basic Metabolic Panel: Recent Labs  Lab 07/12/19 1623 07/12/19 1623 07/13/19 9242 07/13/19 6834 07/13/19 0753 07/14/19 0507 07/14/19 0507 07/15/19 0543  07/17/19 0552  NA 137  --  137  --   --  140  --  140 140  K 3.3*   < > 3.6   < >  --  3.6   < > 3.7 3.4*  CL 100  --  104  --   --  110  --  109 105  CO2 23  --  21*  --   --  20*  --  21* 25  GLUCOSE 150*  --  131*  --   --  103*  --  92 99  BUN 14  --  13  --   --  14  --  16 28*  CREATININE 0.92  --  0.86  --   --  0.81  --  0.78 0.86  CALCIUM 9.6  --  9.2  --   --  8.9  --  8.9 9.0  MG  --   --   --   --  2.0 2.1  --  2.0 2.2   < > = values in this interval not displayed.   Liver Function Tests: Recent Labs  Lab 07/12/19 1623 07/13/19 0412  AST 33 32  ALT 25 23  ALKPHOS 101 87  BILITOT 0.8 0.8  PROT 8.8* 7.8  ALBUMIN 4.3 3.8   No results for input(s): LIPASE, AMYLASE in the last 168 hours. No results for input(s): AMMONIA in the last 168 hours. CBC: Recent Labs  Lab 07/12/19 1623 07/13/19 0412 07/14/19 0507 07/15/19 0543  WBC 17.2* 16.0* 12.1* 11.0*  HGB 16.3* 15.7* 13.8 14.1  HCT 47.9* 45.4 42.5 42.9  MCV 87.2 87.3 90.4 90.3  PLT 339 249 295 294   Cardiac Enzymes: Recent Labs  Lab 07/12/19 1623 07/12/19 2006 07/13/19 0412 07/14/19 0507 07/15/19 0543  CKTOTAL 1,078* 1,204* 1,093*  1,095* 517* 208  CKMB  --   --  9.0*  --   --    BNP: Invalid input(s): POCBNP CBG: No results for input(s): GLUCAP in the last 168 hours.  Time coordinating discharge:  36 minutes  Signed:  Orson Eva, DO [facility] Pager: [phone] 07/17/2019, 12:55 PM

## 2019-07-18 LAB — RPR: RPR Ser Ql: NONREACTIVE

## 2021-12-31 IMAGING — CT CT ANGIO CHEST
2 of 6 series · 19 of 46 positions shown · IV contrast (Omnipaque or Isovue)
Comparison: None.

CLINICAL DATA: Chest pain and shortness of breath for several days.
Elevated D-dimer.

EXAM:
CT ANGIOGRAPHY CHEST WITH CONTRAST
TECHNIQUE: Multidetector CT imaging of the chest was performed using the
standard protocol during bolus administration of intravenous
contrast. Multiplanar CT image reconstructions and MIPs were
obtained to evaluate the vascular anatomy.
CONTRAST:  100mL OMNIPAQUE IOHEXOL 350 MG/ML SOLN

[Series 5: pe axial thins · axial · 0.67mm/px · z∈[+1340,+1594]mm · 16 of 280 slices shown]
[im 13/280  lung]
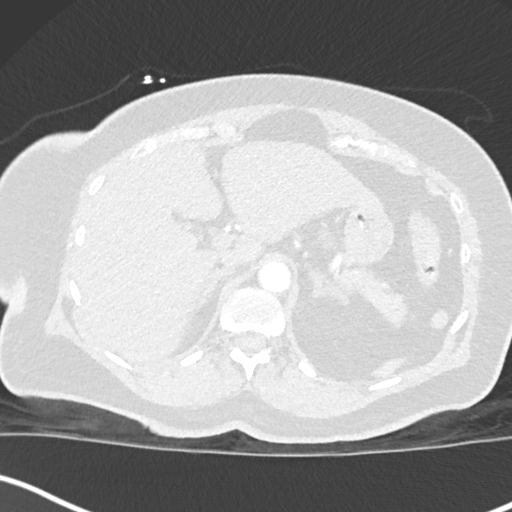
[im 37/280  soft-tissue]
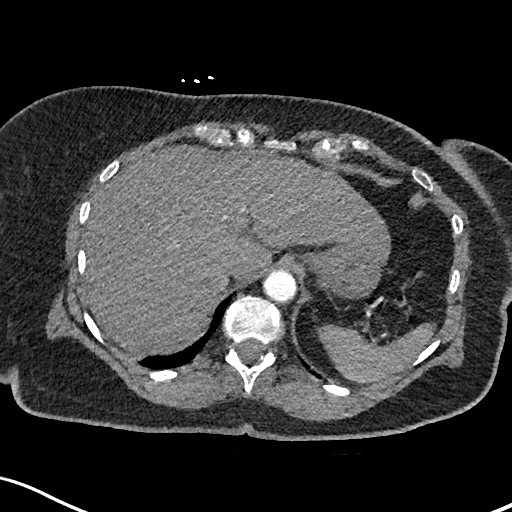
[im 49/280  lung]
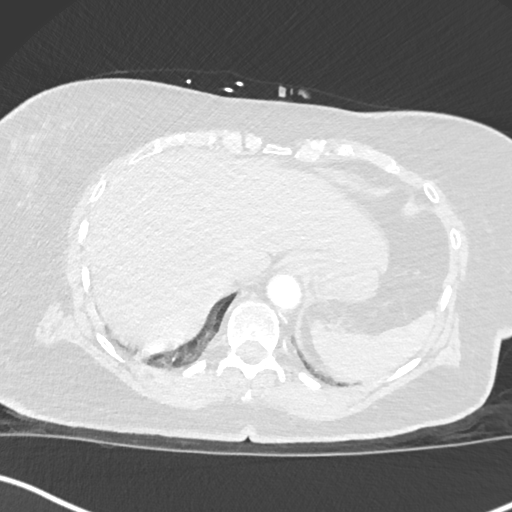
[im 61/280  soft-tissue]
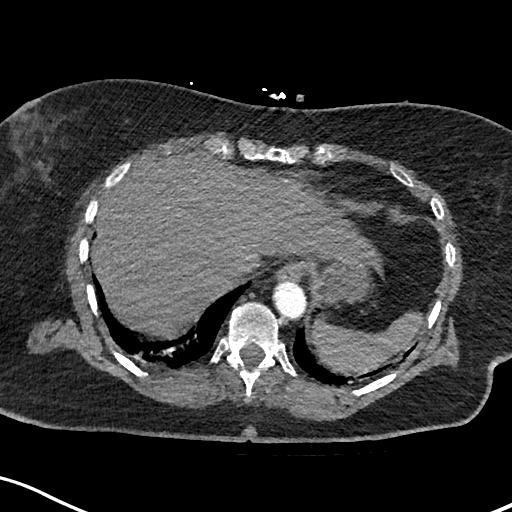
[im 85/280  lung]
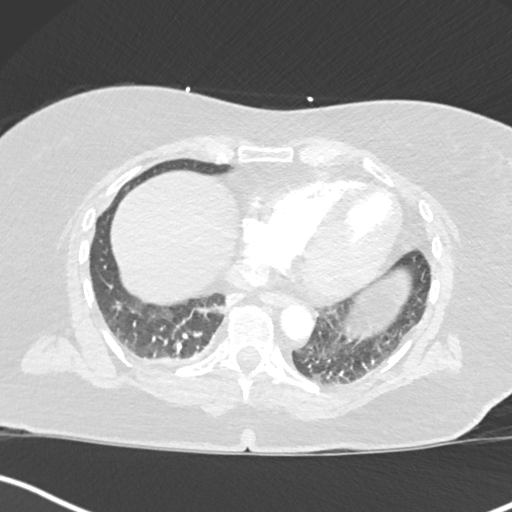
[im 98/280  soft-tissue]
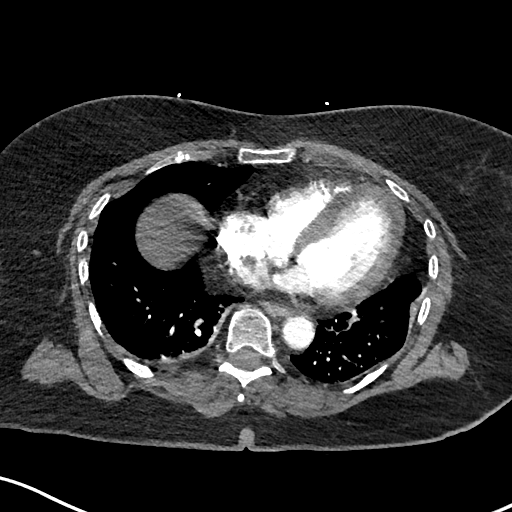
[im 110/280  lung]
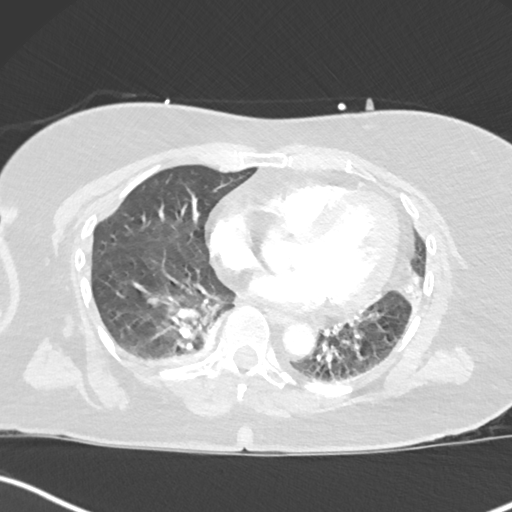
[im 134/280  soft-tissue]
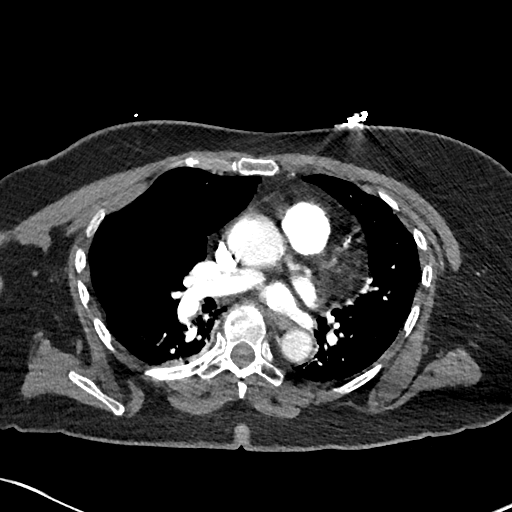
[im 146/280  lung]
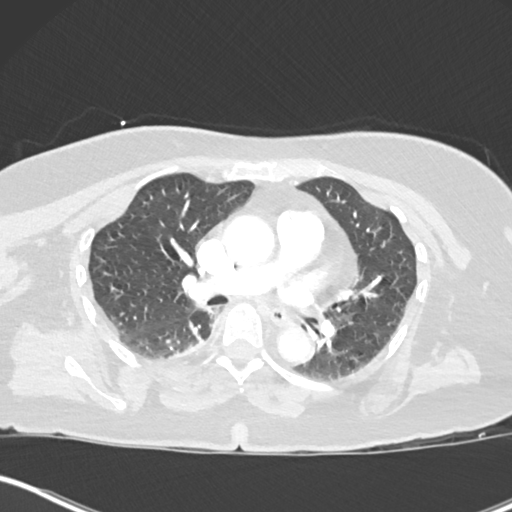
[im 170/280  soft-tissue]
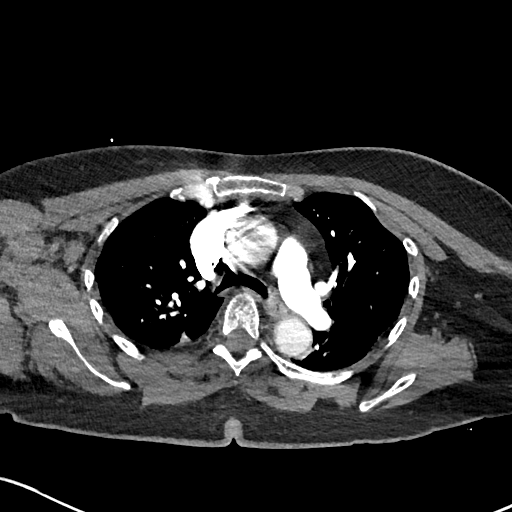
[im 182/280  lung]
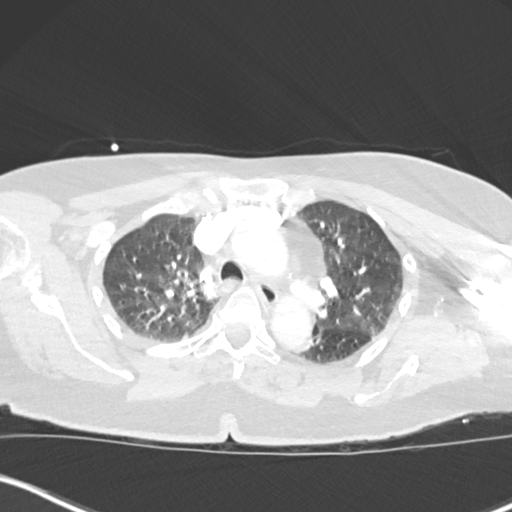
[im 195/280  soft-tissue]
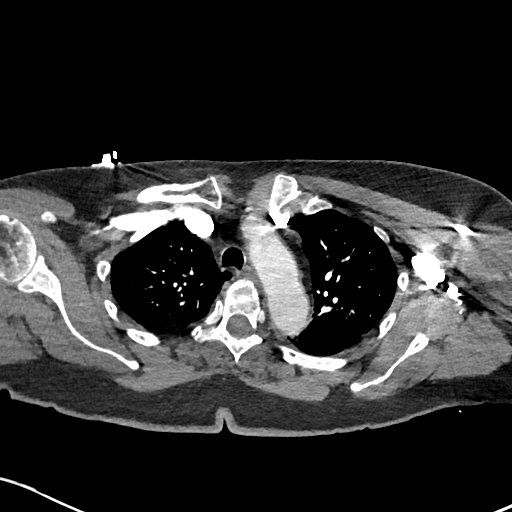
[im 219/280  lung]
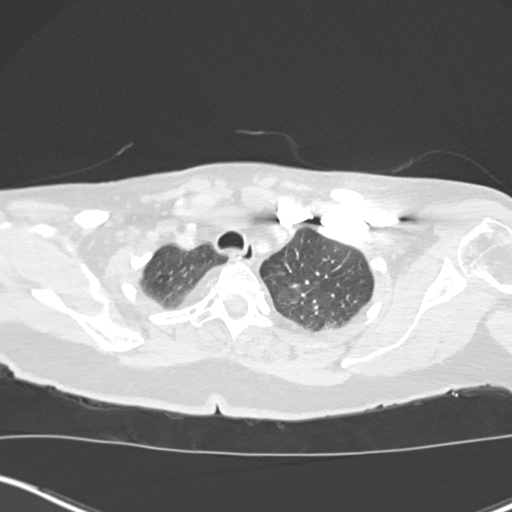
[im 231/280  soft-tissue]
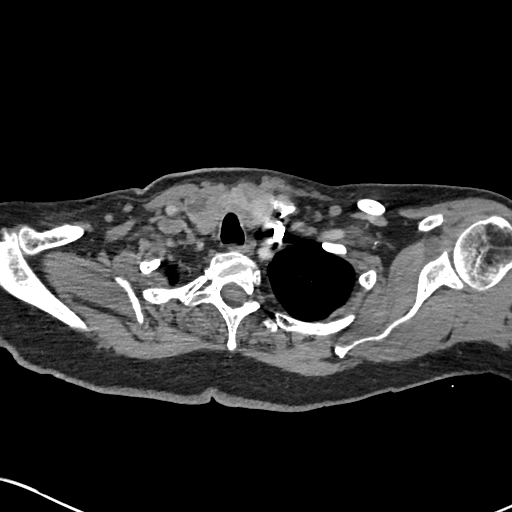
[im 243/280  lung]
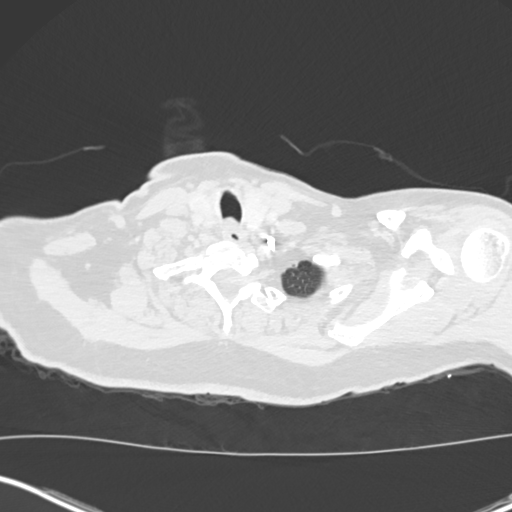
[im 267/280  soft-tissue]
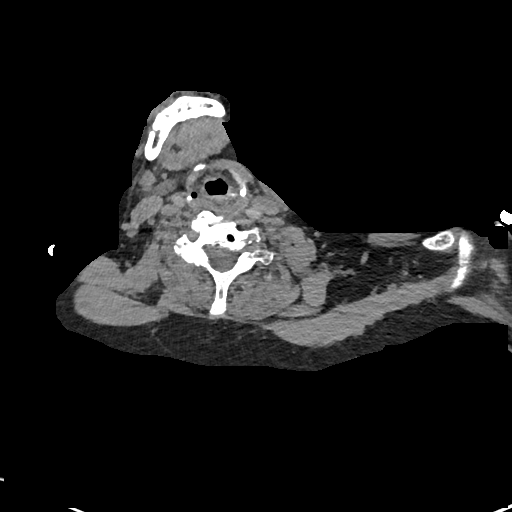

[Series 7: cor soft · coronal · 0.54mm/px · 3 of 139 slices shown]
[im 35/139  soft-tissue]
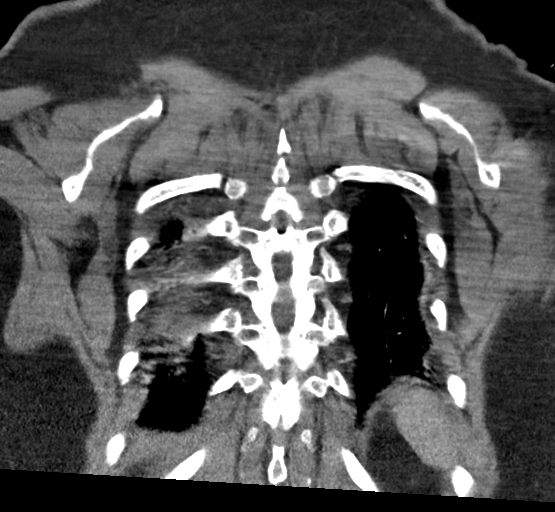
[im 70/139  soft-tissue]
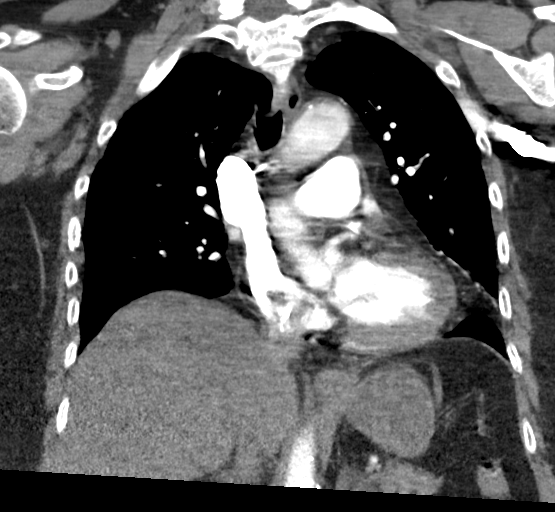
[im 104/139  soft-tissue]
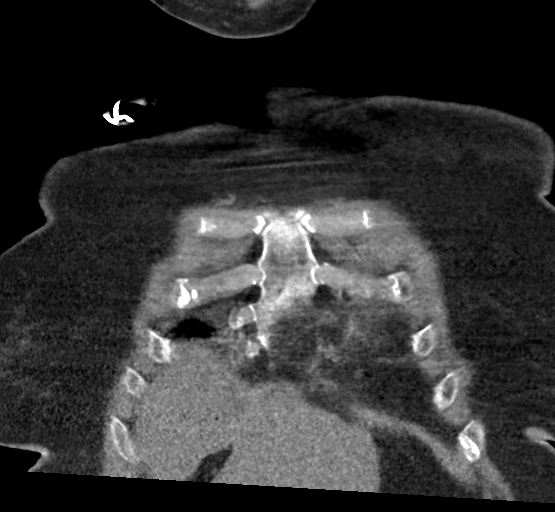

[19 of 46 positions shown; findings below may reference images not displayed]

FINDINGS: Cardiovascular: Some motion artifact noted. Satisfactory
opacification of pulmonary arteries noted, and no pulmonary emboli
identified. No evidence of thoracic aortic dissection or aneurysm.
Aortic and coronary artery atherosclerosis incidentally noted.

Mediastinum/Nodes: Multinodular goiter is seen, with largest nodule
in the left lobe measuring 2.2 cm. No pathologically enlarged lymph
nodes identified.

Lungs/Pleura: Motion artifact noted. Mild atelectasis or scarring is
seen involving the inferior lingula and right lower lobe. No
suspicious pulmonary nodules or masses identified. No evidence of
pulmonary consolidation or pleural effusion.

Upper abdomen: No acute findings.

Musculoskeletal: No suspicious bone lesions identified.

Review of the MIP images confirms the above findings.
IMPRESSION: 1. No evidence of pulmonary embolism.
2. Mild atelectasis or scarring in lingula and right lower lobe.
3. Multinodular goiter, with largest nodule in left lobe measuring
2.2 cm. Recommend thyroid ultrasound for further evaluation. (Ref: [HOSPITAL]. [DATE]): 143-50).

Aortic Atherosclerosis ([id number]). Coronary artery
atherosclerosis.

## 2022-06-23 DIAGNOSIS — R293 Abnormal posture: Secondary | ICD-10-CM | POA: Diagnosis not present

## 2022-06-23 DIAGNOSIS — I63521 Cerebral infarction due to unspecified occlusion or stenosis of right anterior cerebral artery: Secondary | ICD-10-CM | POA: Diagnosis not present

## 2022-06-23 DIAGNOSIS — M6281 Muscle weakness (generalized): Secondary | ICD-10-CM | POA: Diagnosis not present

## 2022-06-23 DIAGNOSIS — I639 Cerebral infarction, unspecified: Secondary | ICD-10-CM | POA: Diagnosis not present

## 2022-06-23 DIAGNOSIS — R2689 Other abnormalities of gait and mobility: Secondary | ICD-10-CM | POA: Diagnosis not present

## 2022-06-24 DIAGNOSIS — R293 Abnormal posture: Secondary | ICD-10-CM | POA: Diagnosis not present

## 2022-06-24 DIAGNOSIS — I639 Cerebral infarction, unspecified: Secondary | ICD-10-CM | POA: Diagnosis not present

## 2022-06-24 DIAGNOSIS — I63521 Cerebral infarction due to unspecified occlusion or stenosis of right anterior cerebral artery: Secondary | ICD-10-CM | POA: Diagnosis not present

## 2022-06-24 DIAGNOSIS — R2689 Other abnormalities of gait and mobility: Secondary | ICD-10-CM | POA: Diagnosis not present

## 2022-06-24 DIAGNOSIS — M6281 Muscle weakness (generalized): Secondary | ICD-10-CM | POA: Diagnosis not present

## 2022-06-25 DIAGNOSIS — I63521 Cerebral infarction due to unspecified occlusion or stenosis of right anterior cerebral artery: Secondary | ICD-10-CM | POA: Diagnosis not present

## 2022-06-25 DIAGNOSIS — I639 Cerebral infarction, unspecified: Secondary | ICD-10-CM | POA: Diagnosis not present

## 2022-06-25 DIAGNOSIS — R293 Abnormal posture: Secondary | ICD-10-CM | POA: Diagnosis not present

## 2022-06-25 DIAGNOSIS — M6281 Muscle weakness (generalized): Secondary | ICD-10-CM | POA: Diagnosis not present

## 2022-06-25 DIAGNOSIS — R2689 Other abnormalities of gait and mobility: Secondary | ICD-10-CM | POA: Diagnosis not present

## 2022-06-28 DIAGNOSIS — R293 Abnormal posture: Secondary | ICD-10-CM | POA: Diagnosis not present

## 2022-06-28 DIAGNOSIS — I639 Cerebral infarction, unspecified: Secondary | ICD-10-CM | POA: Diagnosis not present

## 2022-06-28 DIAGNOSIS — M6281 Muscle weakness (generalized): Secondary | ICD-10-CM | POA: Diagnosis not present

## 2022-06-28 DIAGNOSIS — I63521 Cerebral infarction due to unspecified occlusion or stenosis of right anterior cerebral artery: Secondary | ICD-10-CM | POA: Diagnosis not present

## 2022-06-28 DIAGNOSIS — R2689 Other abnormalities of gait and mobility: Secondary | ICD-10-CM | POA: Diagnosis not present

## 2022-06-29 DIAGNOSIS — M6281 Muscle weakness (generalized): Secondary | ICD-10-CM | POA: Diagnosis not present

## 2022-06-29 DIAGNOSIS — R2689 Other abnormalities of gait and mobility: Secondary | ICD-10-CM | POA: Diagnosis not present

## 2022-06-29 DIAGNOSIS — I63521 Cerebral infarction due to unspecified occlusion or stenosis of right anterior cerebral artery: Secondary | ICD-10-CM | POA: Diagnosis not present

## 2022-06-29 DIAGNOSIS — I639 Cerebral infarction, unspecified: Secondary | ICD-10-CM | POA: Diagnosis not present

## 2022-06-29 DIAGNOSIS — R293 Abnormal posture: Secondary | ICD-10-CM | POA: Diagnosis not present

## 2022-06-30 DIAGNOSIS — M6281 Muscle weakness (generalized): Secondary | ICD-10-CM | POA: Diagnosis not present

## 2022-06-30 DIAGNOSIS — R293 Abnormal posture: Secondary | ICD-10-CM | POA: Diagnosis not present

## 2022-06-30 DIAGNOSIS — I639 Cerebral infarction, unspecified: Secondary | ICD-10-CM | POA: Diagnosis not present

## 2022-06-30 DIAGNOSIS — I63521 Cerebral infarction due to unspecified occlusion or stenosis of right anterior cerebral artery: Secondary | ICD-10-CM | POA: Diagnosis not present

## 2022-06-30 DIAGNOSIS — R2689 Other abnormalities of gait and mobility: Secondary | ICD-10-CM | POA: Diagnosis not present

## 2022-07-01 DIAGNOSIS — R293 Abnormal posture: Secondary | ICD-10-CM | POA: Diagnosis not present

## 2022-07-01 DIAGNOSIS — I63521 Cerebral infarction due to unspecified occlusion or stenosis of right anterior cerebral artery: Secondary | ICD-10-CM | POA: Diagnosis not present

## 2022-07-01 DIAGNOSIS — I639 Cerebral infarction, unspecified: Secondary | ICD-10-CM | POA: Diagnosis not present

## 2022-07-01 DIAGNOSIS — M6281 Muscle weakness (generalized): Secondary | ICD-10-CM | POA: Diagnosis not present

## 2022-07-01 DIAGNOSIS — R2689 Other abnormalities of gait and mobility: Secondary | ICD-10-CM | POA: Diagnosis not present

## 2022-07-02 DIAGNOSIS — I639 Cerebral infarction, unspecified: Secondary | ICD-10-CM | POA: Diagnosis not present

## 2022-07-02 DIAGNOSIS — I63521 Cerebral infarction due to unspecified occlusion or stenosis of right anterior cerebral artery: Secondary | ICD-10-CM | POA: Diagnosis not present

## 2022-07-02 DIAGNOSIS — R2689 Other abnormalities of gait and mobility: Secondary | ICD-10-CM | POA: Diagnosis not present

## 2022-07-02 DIAGNOSIS — M6281 Muscle weakness (generalized): Secondary | ICD-10-CM | POA: Diagnosis not present

## 2022-07-02 DIAGNOSIS — R293 Abnormal posture: Secondary | ICD-10-CM | POA: Diagnosis not present

## 2022-07-05 DIAGNOSIS — I63521 Cerebral infarction due to unspecified occlusion or stenosis of right anterior cerebral artery: Secondary | ICD-10-CM | POA: Diagnosis not present

## 2022-07-05 DIAGNOSIS — I639 Cerebral infarction, unspecified: Secondary | ICD-10-CM | POA: Diagnosis not present

## 2022-07-05 DIAGNOSIS — M6281 Muscle weakness (generalized): Secondary | ICD-10-CM | POA: Diagnosis not present

## 2022-07-05 DIAGNOSIS — R2689 Other abnormalities of gait and mobility: Secondary | ICD-10-CM | POA: Diagnosis not present

## 2022-07-05 DIAGNOSIS — R293 Abnormal posture: Secondary | ICD-10-CM | POA: Diagnosis not present

## 2022-07-06 DIAGNOSIS — I63521 Cerebral infarction due to unspecified occlusion or stenosis of right anterior cerebral artery: Secondary | ICD-10-CM | POA: Diagnosis not present

## 2022-07-06 DIAGNOSIS — R2689 Other abnormalities of gait and mobility: Secondary | ICD-10-CM | POA: Diagnosis not present

## 2022-07-06 DIAGNOSIS — M6281 Muscle weakness (generalized): Secondary | ICD-10-CM | POA: Diagnosis not present

## 2022-07-06 DIAGNOSIS — I639 Cerebral infarction, unspecified: Secondary | ICD-10-CM | POA: Diagnosis not present

## 2022-07-06 DIAGNOSIS — R293 Abnormal posture: Secondary | ICD-10-CM | POA: Diagnosis not present

## 2022-07-07 DIAGNOSIS — M6281 Muscle weakness (generalized): Secondary | ICD-10-CM | POA: Diagnosis not present

## 2022-07-07 DIAGNOSIS — I639 Cerebral infarction, unspecified: Secondary | ICD-10-CM | POA: Diagnosis not present

## 2022-07-07 DIAGNOSIS — R2689 Other abnormalities of gait and mobility: Secondary | ICD-10-CM | POA: Diagnosis not present

## 2022-07-07 DIAGNOSIS — I63521 Cerebral infarction due to unspecified occlusion or stenosis of right anterior cerebral artery: Secondary | ICD-10-CM | POA: Diagnosis not present

## 2022-07-07 DIAGNOSIS — R293 Abnormal posture: Secondary | ICD-10-CM | POA: Diagnosis not present

## 2022-07-08 DIAGNOSIS — E782 Mixed hyperlipidemia: Secondary | ICD-10-CM | POA: Diagnosis not present

## 2022-07-08 DIAGNOSIS — I639 Cerebral infarction, unspecified: Secondary | ICD-10-CM | POA: Diagnosis not present

## 2022-07-08 DIAGNOSIS — R293 Abnormal posture: Secondary | ICD-10-CM | POA: Diagnosis not present

## 2022-07-08 DIAGNOSIS — M6281 Muscle weakness (generalized): Secondary | ICD-10-CM | POA: Diagnosis not present

## 2022-07-08 DIAGNOSIS — Z8673 Personal history of transient ischemic attack (TIA), and cerebral infarction without residual deficits: Secondary | ICD-10-CM | POA: Diagnosis not present

## 2022-07-08 DIAGNOSIS — I10 Essential (primary) hypertension: Secondary | ICD-10-CM | POA: Diagnosis not present

## 2022-07-08 DIAGNOSIS — R2689 Other abnormalities of gait and mobility: Secondary | ICD-10-CM | POA: Diagnosis not present

## 2022-07-08 DIAGNOSIS — I63521 Cerebral infarction due to unspecified occlusion or stenosis of right anterior cerebral artery: Secondary | ICD-10-CM | POA: Diagnosis not present

## 2022-07-09 DIAGNOSIS — M6281 Muscle weakness (generalized): Secondary | ICD-10-CM | POA: Diagnosis not present

## 2022-07-09 DIAGNOSIS — I63521 Cerebral infarction due to unspecified occlusion or stenosis of right anterior cerebral artery: Secondary | ICD-10-CM | POA: Diagnosis not present

## 2022-07-09 DIAGNOSIS — R293 Abnormal posture: Secondary | ICD-10-CM | POA: Diagnosis not present

## 2022-07-09 DIAGNOSIS — I639 Cerebral infarction, unspecified: Secondary | ICD-10-CM | POA: Diagnosis not present

## 2022-07-09 DIAGNOSIS — R2689 Other abnormalities of gait and mobility: Secondary | ICD-10-CM | POA: Diagnosis not present

## 2022-07-10 DIAGNOSIS — I639 Cerebral infarction, unspecified: Secondary | ICD-10-CM | POA: Diagnosis not present

## 2022-07-10 DIAGNOSIS — I63521 Cerebral infarction due to unspecified occlusion or stenosis of right anterior cerebral artery: Secondary | ICD-10-CM | POA: Diagnosis not present

## 2022-07-10 DIAGNOSIS — R293 Abnormal posture: Secondary | ICD-10-CM | POA: Diagnosis not present

## 2022-07-10 DIAGNOSIS — R2689 Other abnormalities of gait and mobility: Secondary | ICD-10-CM | POA: Diagnosis not present

## 2022-07-10 DIAGNOSIS — M6281 Muscle weakness (generalized): Secondary | ICD-10-CM | POA: Diagnosis not present

## 2022-07-12 DIAGNOSIS — R293 Abnormal posture: Secondary | ICD-10-CM | POA: Diagnosis not present

## 2022-07-12 DIAGNOSIS — I639 Cerebral infarction, unspecified: Secondary | ICD-10-CM | POA: Diagnosis not present

## 2022-07-12 DIAGNOSIS — I63521 Cerebral infarction due to unspecified occlusion or stenosis of right anterior cerebral artery: Secondary | ICD-10-CM | POA: Diagnosis not present

## 2022-07-12 DIAGNOSIS — R2689 Other abnormalities of gait and mobility: Secondary | ICD-10-CM | POA: Diagnosis not present

## 2022-07-12 DIAGNOSIS — M6281 Muscle weakness (generalized): Secondary | ICD-10-CM | POA: Diagnosis not present

## 2022-07-13 DIAGNOSIS — I63521 Cerebral infarction due to unspecified occlusion or stenosis of right anterior cerebral artery: Secondary | ICD-10-CM | POA: Diagnosis not present

## 2022-07-13 DIAGNOSIS — R293 Abnormal posture: Secondary | ICD-10-CM | POA: Diagnosis not present

## 2022-07-13 DIAGNOSIS — M6281 Muscle weakness (generalized): Secondary | ICD-10-CM | POA: Diagnosis not present

## 2022-07-13 DIAGNOSIS — R2689 Other abnormalities of gait and mobility: Secondary | ICD-10-CM | POA: Diagnosis not present

## 2022-07-13 DIAGNOSIS — I639 Cerebral infarction, unspecified: Secondary | ICD-10-CM | POA: Diagnosis not present

## 2022-07-14 DIAGNOSIS — I63521 Cerebral infarction due to unspecified occlusion or stenosis of right anterior cerebral artery: Secondary | ICD-10-CM | POA: Diagnosis not present

## 2022-07-14 DIAGNOSIS — I639 Cerebral infarction, unspecified: Secondary | ICD-10-CM | POA: Diagnosis not present

## 2022-07-14 DIAGNOSIS — M6281 Muscle weakness (generalized): Secondary | ICD-10-CM | POA: Diagnosis not present

## 2022-07-14 DIAGNOSIS — R293 Abnormal posture: Secondary | ICD-10-CM | POA: Diagnosis not present

## 2022-07-14 DIAGNOSIS — R2689 Other abnormalities of gait and mobility: Secondary | ICD-10-CM | POA: Diagnosis not present

## 2022-07-15 DIAGNOSIS — I639 Cerebral infarction, unspecified: Secondary | ICD-10-CM | POA: Diagnosis not present

## 2022-07-15 DIAGNOSIS — R293 Abnormal posture: Secondary | ICD-10-CM | POA: Diagnosis not present

## 2022-07-15 DIAGNOSIS — R2689 Other abnormalities of gait and mobility: Secondary | ICD-10-CM | POA: Diagnosis not present

## 2022-07-15 DIAGNOSIS — M6281 Muscle weakness (generalized): Secondary | ICD-10-CM | POA: Diagnosis not present

## 2022-07-15 DIAGNOSIS — I63521 Cerebral infarction due to unspecified occlusion or stenosis of right anterior cerebral artery: Secondary | ICD-10-CM | POA: Diagnosis not present

## 2022-07-16 DIAGNOSIS — M6281 Muscle weakness (generalized): Secondary | ICD-10-CM | POA: Diagnosis not present

## 2022-07-16 DIAGNOSIS — I63521 Cerebral infarction due to unspecified occlusion or stenosis of right anterior cerebral artery: Secondary | ICD-10-CM | POA: Diagnosis not present

## 2022-07-16 DIAGNOSIS — R293 Abnormal posture: Secondary | ICD-10-CM | POA: Diagnosis not present

## 2022-07-16 DIAGNOSIS — R2689 Other abnormalities of gait and mobility: Secondary | ICD-10-CM | POA: Diagnosis not present

## 2022-07-16 DIAGNOSIS — I639 Cerebral infarction, unspecified: Secondary | ICD-10-CM | POA: Diagnosis not present

## 2022-07-17 DIAGNOSIS — R2689 Other abnormalities of gait and mobility: Secondary | ICD-10-CM | POA: Diagnosis not present

## 2022-07-17 DIAGNOSIS — M6281 Muscle weakness (generalized): Secondary | ICD-10-CM | POA: Diagnosis not present

## 2022-07-17 DIAGNOSIS — R293 Abnormal posture: Secondary | ICD-10-CM | POA: Diagnosis not present

## 2022-07-17 DIAGNOSIS — I639 Cerebral infarction, unspecified: Secondary | ICD-10-CM | POA: Diagnosis not present

## 2022-07-17 DIAGNOSIS — I63521 Cerebral infarction due to unspecified occlusion or stenosis of right anterior cerebral artery: Secondary | ICD-10-CM | POA: Diagnosis not present

## 2022-07-19 DIAGNOSIS — I63521 Cerebral infarction due to unspecified occlusion or stenosis of right anterior cerebral artery: Secondary | ICD-10-CM | POA: Diagnosis not present

## 2022-07-19 DIAGNOSIS — R293 Abnormal posture: Secondary | ICD-10-CM | POA: Diagnosis not present

## 2022-07-19 DIAGNOSIS — I639 Cerebral infarction, unspecified: Secondary | ICD-10-CM | POA: Diagnosis not present

## 2022-07-19 DIAGNOSIS — M6281 Muscle weakness (generalized): Secondary | ICD-10-CM | POA: Diagnosis not present

## 2022-07-19 DIAGNOSIS — R2689 Other abnormalities of gait and mobility: Secondary | ICD-10-CM | POA: Diagnosis not present

## 2022-07-20 DIAGNOSIS — I639 Cerebral infarction, unspecified: Secondary | ICD-10-CM | POA: Diagnosis not present

## 2022-07-20 DIAGNOSIS — R2689 Other abnormalities of gait and mobility: Secondary | ICD-10-CM | POA: Diagnosis not present

## 2022-07-20 DIAGNOSIS — M6281 Muscle weakness (generalized): Secondary | ICD-10-CM | POA: Diagnosis not present

## 2022-07-20 DIAGNOSIS — I63521 Cerebral infarction due to unspecified occlusion or stenosis of right anterior cerebral artery: Secondary | ICD-10-CM | POA: Diagnosis not present

## 2022-07-20 DIAGNOSIS — R293 Abnormal posture: Secondary | ICD-10-CM | POA: Diagnosis not present

## 2022-07-21 DIAGNOSIS — R293 Abnormal posture: Secondary | ICD-10-CM | POA: Diagnosis not present

## 2022-07-21 DIAGNOSIS — I63521 Cerebral infarction due to unspecified occlusion or stenosis of right anterior cerebral artery: Secondary | ICD-10-CM | POA: Diagnosis not present

## 2022-07-21 DIAGNOSIS — R2689 Other abnormalities of gait and mobility: Secondary | ICD-10-CM | POA: Diagnosis not present

## 2022-07-21 DIAGNOSIS — I639 Cerebral infarction, unspecified: Secondary | ICD-10-CM | POA: Diagnosis not present

## 2022-07-21 DIAGNOSIS — M6281 Muscle weakness (generalized): Secondary | ICD-10-CM | POA: Diagnosis not present

## 2022-07-22 DIAGNOSIS — I63521 Cerebral infarction due to unspecified occlusion or stenosis of right anterior cerebral artery: Secondary | ICD-10-CM | POA: Diagnosis not present

## 2022-07-22 DIAGNOSIS — R293 Abnormal posture: Secondary | ICD-10-CM | POA: Diagnosis not present

## 2022-07-22 DIAGNOSIS — I639 Cerebral infarction, unspecified: Secondary | ICD-10-CM | POA: Diagnosis not present

## 2022-07-22 DIAGNOSIS — M6281 Muscle weakness (generalized): Secondary | ICD-10-CM | POA: Diagnosis not present

## 2022-07-22 DIAGNOSIS — R2689 Other abnormalities of gait and mobility: Secondary | ICD-10-CM | POA: Diagnosis not present

## 2022-07-23 DIAGNOSIS — R293 Abnormal posture: Secondary | ICD-10-CM | POA: Diagnosis not present

## 2022-07-23 DIAGNOSIS — I63521 Cerebral infarction due to unspecified occlusion or stenosis of right anterior cerebral artery: Secondary | ICD-10-CM | POA: Diagnosis not present

## 2022-07-23 DIAGNOSIS — M6281 Muscle weakness (generalized): Secondary | ICD-10-CM | POA: Diagnosis not present

## 2022-07-23 DIAGNOSIS — I639 Cerebral infarction, unspecified: Secondary | ICD-10-CM | POA: Diagnosis not present

## 2022-07-23 DIAGNOSIS — R2689 Other abnormalities of gait and mobility: Secondary | ICD-10-CM | POA: Diagnosis not present

## 2022-07-26 DIAGNOSIS — M6281 Muscle weakness (generalized): Secondary | ICD-10-CM | POA: Diagnosis not present

## 2022-07-26 DIAGNOSIS — R293 Abnormal posture: Secondary | ICD-10-CM | POA: Diagnosis not present

## 2022-07-26 DIAGNOSIS — M2041 Other hammer toe(s) (acquired), right foot: Secondary | ICD-10-CM | POA: Diagnosis not present

## 2022-07-26 DIAGNOSIS — I739 Peripheral vascular disease, unspecified: Secondary | ICD-10-CM | POA: Diagnosis not present

## 2022-07-26 DIAGNOSIS — B351 Tinea unguium: Secondary | ICD-10-CM | POA: Diagnosis not present

## 2022-07-26 DIAGNOSIS — I63521 Cerebral infarction due to unspecified occlusion or stenosis of right anterior cerebral artery: Secondary | ICD-10-CM | POA: Diagnosis not present

## 2022-07-26 DIAGNOSIS — M2042 Other hammer toe(s) (acquired), left foot: Secondary | ICD-10-CM | POA: Diagnosis not present

## 2022-07-26 DIAGNOSIS — I639 Cerebral infarction, unspecified: Secondary | ICD-10-CM | POA: Diagnosis not present

## 2022-07-26 DIAGNOSIS — R2689 Other abnormalities of gait and mobility: Secondary | ICD-10-CM | POA: Diagnosis not present

## 2022-07-27 DIAGNOSIS — R293 Abnormal posture: Secondary | ICD-10-CM | POA: Diagnosis not present

## 2022-07-27 DIAGNOSIS — R2689 Other abnormalities of gait and mobility: Secondary | ICD-10-CM | POA: Diagnosis not present

## 2022-07-27 DIAGNOSIS — I63521 Cerebral infarction due to unspecified occlusion or stenosis of right anterior cerebral artery: Secondary | ICD-10-CM | POA: Diagnosis not present

## 2022-07-27 DIAGNOSIS — I639 Cerebral infarction, unspecified: Secondary | ICD-10-CM | POA: Diagnosis not present

## 2022-07-27 DIAGNOSIS — M6281 Muscle weakness (generalized): Secondary | ICD-10-CM | POA: Diagnosis not present

## 2022-07-28 DIAGNOSIS — I639 Cerebral infarction, unspecified: Secondary | ICD-10-CM | POA: Diagnosis not present

## 2022-07-28 DIAGNOSIS — R293 Abnormal posture: Secondary | ICD-10-CM | POA: Diagnosis not present

## 2022-07-28 DIAGNOSIS — R2689 Other abnormalities of gait and mobility: Secondary | ICD-10-CM | POA: Diagnosis not present

## 2022-07-28 DIAGNOSIS — M6281 Muscle weakness (generalized): Secondary | ICD-10-CM | POA: Diagnosis not present

## 2022-07-28 DIAGNOSIS — I63521 Cerebral infarction due to unspecified occlusion or stenosis of right anterior cerebral artery: Secondary | ICD-10-CM | POA: Diagnosis not present

## 2022-07-29 DIAGNOSIS — R2689 Other abnormalities of gait and mobility: Secondary | ICD-10-CM | POA: Diagnosis not present

## 2022-07-29 DIAGNOSIS — R293 Abnormal posture: Secondary | ICD-10-CM | POA: Diagnosis not present

## 2022-07-29 DIAGNOSIS — I63521 Cerebral infarction due to unspecified occlusion or stenosis of right anterior cerebral artery: Secondary | ICD-10-CM | POA: Diagnosis not present

## 2022-07-29 DIAGNOSIS — M6281 Muscle weakness (generalized): Secondary | ICD-10-CM | POA: Diagnosis not present

## 2022-07-29 DIAGNOSIS — I639 Cerebral infarction, unspecified: Secondary | ICD-10-CM | POA: Diagnosis not present

## 2022-07-30 DIAGNOSIS — R293 Abnormal posture: Secondary | ICD-10-CM | POA: Diagnosis not present

## 2022-07-30 DIAGNOSIS — I63521 Cerebral infarction due to unspecified occlusion or stenosis of right anterior cerebral artery: Secondary | ICD-10-CM | POA: Diagnosis not present

## 2022-07-30 DIAGNOSIS — I639 Cerebral infarction, unspecified: Secondary | ICD-10-CM | POA: Diagnosis not present

## 2022-07-30 DIAGNOSIS — M6281 Muscle weakness (generalized): Secondary | ICD-10-CM | POA: Diagnosis not present

## 2022-07-30 DIAGNOSIS — R2689 Other abnormalities of gait and mobility: Secondary | ICD-10-CM | POA: Diagnosis not present

## 2022-07-31 DIAGNOSIS — R2689 Other abnormalities of gait and mobility: Secondary | ICD-10-CM | POA: Diagnosis not present

## 2022-07-31 DIAGNOSIS — M6281 Muscle weakness (generalized): Secondary | ICD-10-CM | POA: Diagnosis not present

## 2022-07-31 DIAGNOSIS — R293 Abnormal posture: Secondary | ICD-10-CM | POA: Diagnosis not present

## 2022-07-31 DIAGNOSIS — I639 Cerebral infarction, unspecified: Secondary | ICD-10-CM | POA: Diagnosis not present

## 2022-07-31 DIAGNOSIS — I63521 Cerebral infarction due to unspecified occlusion or stenosis of right anterior cerebral artery: Secondary | ICD-10-CM | POA: Diagnosis not present

## 2022-08-03 DIAGNOSIS — R2689 Other abnormalities of gait and mobility: Secondary | ICD-10-CM | POA: Diagnosis not present

## 2022-08-03 DIAGNOSIS — I63521 Cerebral infarction due to unspecified occlusion or stenosis of right anterior cerebral artery: Secondary | ICD-10-CM | POA: Diagnosis not present

## 2022-08-03 DIAGNOSIS — R293 Abnormal posture: Secondary | ICD-10-CM | POA: Diagnosis not present

## 2022-08-03 DIAGNOSIS — I639 Cerebral infarction, unspecified: Secondary | ICD-10-CM | POA: Diagnosis not present

## 2022-08-03 DIAGNOSIS — M6281 Muscle weakness (generalized): Secondary | ICD-10-CM | POA: Diagnosis not present

## 2022-08-04 DIAGNOSIS — I639 Cerebral infarction, unspecified: Secondary | ICD-10-CM | POA: Diagnosis not present

## 2022-08-04 DIAGNOSIS — R293 Abnormal posture: Secondary | ICD-10-CM | POA: Diagnosis not present

## 2022-08-04 DIAGNOSIS — R2689 Other abnormalities of gait and mobility: Secondary | ICD-10-CM | POA: Diagnosis not present

## 2022-08-04 DIAGNOSIS — M6281 Muscle weakness (generalized): Secondary | ICD-10-CM | POA: Diagnosis not present

## 2022-08-04 DIAGNOSIS — I63521 Cerebral infarction due to unspecified occlusion or stenosis of right anterior cerebral artery: Secondary | ICD-10-CM | POA: Diagnosis not present

## 2022-08-05 DIAGNOSIS — I63521 Cerebral infarction due to unspecified occlusion or stenosis of right anterior cerebral artery: Secondary | ICD-10-CM | POA: Diagnosis not present

## 2022-08-05 DIAGNOSIS — I639 Cerebral infarction, unspecified: Secondary | ICD-10-CM | POA: Diagnosis not present

## 2022-08-05 DIAGNOSIS — M6281 Muscle weakness (generalized): Secondary | ICD-10-CM | POA: Diagnosis not present

## 2022-08-05 DIAGNOSIS — R293 Abnormal posture: Secondary | ICD-10-CM | POA: Diagnosis not present

## 2022-08-05 DIAGNOSIS — R2689 Other abnormalities of gait and mobility: Secondary | ICD-10-CM | POA: Diagnosis not present

## 2022-08-06 DIAGNOSIS — R2689 Other abnormalities of gait and mobility: Secondary | ICD-10-CM | POA: Diagnosis not present

## 2022-08-06 DIAGNOSIS — I63521 Cerebral infarction due to unspecified occlusion or stenosis of right anterior cerebral artery: Secondary | ICD-10-CM | POA: Diagnosis not present

## 2022-08-06 DIAGNOSIS — I639 Cerebral infarction, unspecified: Secondary | ICD-10-CM | POA: Diagnosis not present

## 2022-08-06 DIAGNOSIS — M6281 Muscle weakness (generalized): Secondary | ICD-10-CM | POA: Diagnosis not present

## 2022-08-06 DIAGNOSIS — R293 Abnormal posture: Secondary | ICD-10-CM | POA: Diagnosis not present

## 2022-08-09 DIAGNOSIS — I639 Cerebral infarction, unspecified: Secondary | ICD-10-CM | POA: Diagnosis not present

## 2022-08-09 DIAGNOSIS — R2689 Other abnormalities of gait and mobility: Secondary | ICD-10-CM | POA: Diagnosis not present

## 2022-08-09 DIAGNOSIS — M6281 Muscle weakness (generalized): Secondary | ICD-10-CM | POA: Diagnosis not present

## 2022-08-09 DIAGNOSIS — I63521 Cerebral infarction due to unspecified occlusion or stenosis of right anterior cerebral artery: Secondary | ICD-10-CM | POA: Diagnosis not present

## 2022-08-09 DIAGNOSIS — R293 Abnormal posture: Secondary | ICD-10-CM | POA: Diagnosis not present

## 2022-08-10 DIAGNOSIS — M6281 Muscle weakness (generalized): Secondary | ICD-10-CM | POA: Diagnosis not present

## 2022-08-10 DIAGNOSIS — I639 Cerebral infarction, unspecified: Secondary | ICD-10-CM | POA: Diagnosis not present

## 2022-08-10 DIAGNOSIS — Z8673 Personal history of transient ischemic attack (TIA), and cerebral infarction without residual deficits: Secondary | ICD-10-CM | POA: Diagnosis not present

## 2022-08-10 DIAGNOSIS — I63521 Cerebral infarction due to unspecified occlusion or stenosis of right anterior cerebral artery: Secondary | ICD-10-CM | POA: Diagnosis not present

## 2022-08-10 DIAGNOSIS — I10 Essential (primary) hypertension: Secondary | ICD-10-CM | POA: Diagnosis not present

## 2022-08-10 DIAGNOSIS — R293 Abnormal posture: Secondary | ICD-10-CM | POA: Diagnosis not present

## 2022-08-10 DIAGNOSIS — R2689 Other abnormalities of gait and mobility: Secondary | ICD-10-CM | POA: Diagnosis not present

## 2022-08-10 DIAGNOSIS — E782 Mixed hyperlipidemia: Secondary | ICD-10-CM | POA: Diagnosis not present

## 2022-08-12 DIAGNOSIS — M6281 Muscle weakness (generalized): Secondary | ICD-10-CM | POA: Diagnosis not present

## 2022-08-12 DIAGNOSIS — R293 Abnormal posture: Secondary | ICD-10-CM | POA: Diagnosis not present

## 2022-08-12 DIAGNOSIS — I63521 Cerebral infarction due to unspecified occlusion or stenosis of right anterior cerebral artery: Secondary | ICD-10-CM | POA: Diagnosis not present

## 2022-08-12 DIAGNOSIS — I639 Cerebral infarction, unspecified: Secondary | ICD-10-CM | POA: Diagnosis not present

## 2022-08-12 DIAGNOSIS — R2689 Other abnormalities of gait and mobility: Secondary | ICD-10-CM | POA: Diagnosis not present

## 2022-08-13 DIAGNOSIS — I63521 Cerebral infarction due to unspecified occlusion or stenosis of right anterior cerebral artery: Secondary | ICD-10-CM | POA: Diagnosis not present

## 2022-08-13 DIAGNOSIS — M6281 Muscle weakness (generalized): Secondary | ICD-10-CM | POA: Diagnosis not present

## 2022-08-13 DIAGNOSIS — I639 Cerebral infarction, unspecified: Secondary | ICD-10-CM | POA: Diagnosis not present

## 2022-08-13 DIAGNOSIS — R293 Abnormal posture: Secondary | ICD-10-CM | POA: Diagnosis not present

## 2022-08-13 DIAGNOSIS — R2689 Other abnormalities of gait and mobility: Secondary | ICD-10-CM | POA: Diagnosis not present

## 2022-08-16 DIAGNOSIS — I63521 Cerebral infarction due to unspecified occlusion or stenosis of right anterior cerebral artery: Secondary | ICD-10-CM | POA: Diagnosis not present

## 2022-08-16 DIAGNOSIS — M6281 Muscle weakness (generalized): Secondary | ICD-10-CM | POA: Diagnosis not present

## 2022-08-16 DIAGNOSIS — R293 Abnormal posture: Secondary | ICD-10-CM | POA: Diagnosis not present

## 2022-08-16 DIAGNOSIS — I639 Cerebral infarction, unspecified: Secondary | ICD-10-CM | POA: Diagnosis not present

## 2022-08-16 DIAGNOSIS — R2689 Other abnormalities of gait and mobility: Secondary | ICD-10-CM | POA: Diagnosis not present

## 2022-08-17 DIAGNOSIS — I639 Cerebral infarction, unspecified: Secondary | ICD-10-CM | POA: Diagnosis not present

## 2022-08-17 DIAGNOSIS — M6281 Muscle weakness (generalized): Secondary | ICD-10-CM | POA: Diagnosis not present

## 2022-08-17 DIAGNOSIS — R293 Abnormal posture: Secondary | ICD-10-CM | POA: Diagnosis not present

## 2022-08-17 DIAGNOSIS — I63521 Cerebral infarction due to unspecified occlusion or stenosis of right anterior cerebral artery: Secondary | ICD-10-CM | POA: Diagnosis not present

## 2022-08-17 DIAGNOSIS — R2689 Other abnormalities of gait and mobility: Secondary | ICD-10-CM | POA: Diagnosis not present

## 2022-08-18 DIAGNOSIS — I639 Cerebral infarction, unspecified: Secondary | ICD-10-CM | POA: Diagnosis not present

## 2022-08-18 DIAGNOSIS — R293 Abnormal posture: Secondary | ICD-10-CM | POA: Diagnosis not present

## 2022-08-18 DIAGNOSIS — I63521 Cerebral infarction due to unspecified occlusion or stenosis of right anterior cerebral artery: Secondary | ICD-10-CM | POA: Diagnosis not present

## 2022-08-18 DIAGNOSIS — R2689 Other abnormalities of gait and mobility: Secondary | ICD-10-CM | POA: Diagnosis not present

## 2022-08-18 DIAGNOSIS — M6281 Muscle weakness (generalized): Secondary | ICD-10-CM | POA: Diagnosis not present

## 2022-08-19 DIAGNOSIS — I63521 Cerebral infarction due to unspecified occlusion or stenosis of right anterior cerebral artery: Secondary | ICD-10-CM | POA: Diagnosis not present

## 2022-08-19 DIAGNOSIS — M6281 Muscle weakness (generalized): Secondary | ICD-10-CM | POA: Diagnosis not present

## 2022-08-19 DIAGNOSIS — R293 Abnormal posture: Secondary | ICD-10-CM | POA: Diagnosis not present

## 2022-08-19 DIAGNOSIS — I639 Cerebral infarction, unspecified: Secondary | ICD-10-CM | POA: Diagnosis not present

## 2022-08-19 DIAGNOSIS — R2689 Other abnormalities of gait and mobility: Secondary | ICD-10-CM | POA: Diagnosis not present

## 2022-08-20 DIAGNOSIS — I63521 Cerebral infarction due to unspecified occlusion or stenosis of right anterior cerebral artery: Secondary | ICD-10-CM | POA: Diagnosis not present

## 2022-08-20 DIAGNOSIS — I639 Cerebral infarction, unspecified: Secondary | ICD-10-CM | POA: Diagnosis not present

## 2022-08-20 DIAGNOSIS — R2689 Other abnormalities of gait and mobility: Secondary | ICD-10-CM | POA: Diagnosis not present

## 2022-08-20 DIAGNOSIS — R293 Abnormal posture: Secondary | ICD-10-CM | POA: Diagnosis not present

## 2022-08-20 DIAGNOSIS — M6281 Muscle weakness (generalized): Secondary | ICD-10-CM | POA: Diagnosis not present

## 2022-08-23 DIAGNOSIS — R2689 Other abnormalities of gait and mobility: Secondary | ICD-10-CM | POA: Diagnosis not present

## 2022-08-23 DIAGNOSIS — R293 Abnormal posture: Secondary | ICD-10-CM | POA: Diagnosis not present

## 2022-08-23 DIAGNOSIS — I63521 Cerebral infarction due to unspecified occlusion or stenosis of right anterior cerebral artery: Secondary | ICD-10-CM | POA: Diagnosis not present

## 2022-08-23 DIAGNOSIS — I639 Cerebral infarction, unspecified: Secondary | ICD-10-CM | POA: Diagnosis not present

## 2022-08-23 DIAGNOSIS — M6281 Muscle weakness (generalized): Secondary | ICD-10-CM | POA: Diagnosis not present

## 2022-08-25 DIAGNOSIS — R293 Abnormal posture: Secondary | ICD-10-CM | POA: Diagnosis not present

## 2022-08-25 DIAGNOSIS — R2689 Other abnormalities of gait and mobility: Secondary | ICD-10-CM | POA: Diagnosis not present

## 2022-08-25 DIAGNOSIS — M6281 Muscle weakness (generalized): Secondary | ICD-10-CM | POA: Diagnosis not present

## 2022-08-25 DIAGNOSIS — I639 Cerebral infarction, unspecified: Secondary | ICD-10-CM | POA: Diagnosis not present

## 2022-08-25 DIAGNOSIS — I63521 Cerebral infarction due to unspecified occlusion or stenosis of right anterior cerebral artery: Secondary | ICD-10-CM | POA: Diagnosis not present

## 2022-08-26 DIAGNOSIS — I639 Cerebral infarction, unspecified: Secondary | ICD-10-CM | POA: Diagnosis not present

## 2022-08-26 DIAGNOSIS — M6281 Muscle weakness (generalized): Secondary | ICD-10-CM | POA: Diagnosis not present

## 2022-08-26 DIAGNOSIS — I63521 Cerebral infarction due to unspecified occlusion or stenosis of right anterior cerebral artery: Secondary | ICD-10-CM | POA: Diagnosis not present

## 2022-08-26 DIAGNOSIS — R2689 Other abnormalities of gait and mobility: Secondary | ICD-10-CM | POA: Diagnosis not present

## 2022-08-26 DIAGNOSIS — R293 Abnormal posture: Secondary | ICD-10-CM | POA: Diagnosis not present

## 2022-08-27 DIAGNOSIS — R293 Abnormal posture: Secondary | ICD-10-CM | POA: Diagnosis not present

## 2022-08-27 DIAGNOSIS — I63521 Cerebral infarction due to unspecified occlusion or stenosis of right anterior cerebral artery: Secondary | ICD-10-CM | POA: Diagnosis not present

## 2022-08-27 DIAGNOSIS — M6281 Muscle weakness (generalized): Secondary | ICD-10-CM | POA: Diagnosis not present

## 2022-08-27 DIAGNOSIS — I639 Cerebral infarction, unspecified: Secondary | ICD-10-CM | POA: Diagnosis not present

## 2022-08-27 DIAGNOSIS — R2689 Other abnormalities of gait and mobility: Secondary | ICD-10-CM | POA: Diagnosis not present

## 2022-08-28 DIAGNOSIS — R2689 Other abnormalities of gait and mobility: Secondary | ICD-10-CM | POA: Diagnosis not present

## 2022-08-28 DIAGNOSIS — M6281 Muscle weakness (generalized): Secondary | ICD-10-CM | POA: Diagnosis not present

## 2022-08-28 DIAGNOSIS — I63521 Cerebral infarction due to unspecified occlusion or stenosis of right anterior cerebral artery: Secondary | ICD-10-CM | POA: Diagnosis not present

## 2022-08-28 DIAGNOSIS — I639 Cerebral infarction, unspecified: Secondary | ICD-10-CM | POA: Diagnosis not present

## 2022-08-28 DIAGNOSIS — R293 Abnormal posture: Secondary | ICD-10-CM | POA: Diagnosis not present

## 2022-08-30 DIAGNOSIS — R2689 Other abnormalities of gait and mobility: Secondary | ICD-10-CM | POA: Diagnosis not present

## 2022-08-30 DIAGNOSIS — M6281 Muscle weakness (generalized): Secondary | ICD-10-CM | POA: Diagnosis not present

## 2022-08-30 DIAGNOSIS — I63521 Cerebral infarction due to unspecified occlusion or stenosis of right anterior cerebral artery: Secondary | ICD-10-CM | POA: Diagnosis not present

## 2022-08-30 DIAGNOSIS — R293 Abnormal posture: Secondary | ICD-10-CM | POA: Diagnosis not present

## 2022-08-30 DIAGNOSIS — I639 Cerebral infarction, unspecified: Secondary | ICD-10-CM | POA: Diagnosis not present

## 2022-08-31 DIAGNOSIS — I639 Cerebral infarction, unspecified: Secondary | ICD-10-CM | POA: Diagnosis not present

## 2022-08-31 DIAGNOSIS — I63521 Cerebral infarction due to unspecified occlusion or stenosis of right anterior cerebral artery: Secondary | ICD-10-CM | POA: Diagnosis not present

## 2022-08-31 DIAGNOSIS — M6281 Muscle weakness (generalized): Secondary | ICD-10-CM | POA: Diagnosis not present

## 2022-08-31 DIAGNOSIS — R2689 Other abnormalities of gait and mobility: Secondary | ICD-10-CM | POA: Diagnosis not present

## 2022-08-31 DIAGNOSIS — R293 Abnormal posture: Secondary | ICD-10-CM | POA: Diagnosis not present

## 2022-09-01 DIAGNOSIS — R2689 Other abnormalities of gait and mobility: Secondary | ICD-10-CM | POA: Diagnosis not present

## 2022-09-01 DIAGNOSIS — I639 Cerebral infarction, unspecified: Secondary | ICD-10-CM | POA: Diagnosis not present

## 2022-09-01 DIAGNOSIS — R293 Abnormal posture: Secondary | ICD-10-CM | POA: Diagnosis not present

## 2022-09-01 DIAGNOSIS — I63521 Cerebral infarction due to unspecified occlusion or stenosis of right anterior cerebral artery: Secondary | ICD-10-CM | POA: Diagnosis not present

## 2022-09-01 DIAGNOSIS — M6281 Muscle weakness (generalized): Secondary | ICD-10-CM | POA: Diagnosis not present

## 2022-09-02 DIAGNOSIS — I639 Cerebral infarction, unspecified: Secondary | ICD-10-CM | POA: Diagnosis not present

## 2022-09-02 DIAGNOSIS — I63521 Cerebral infarction due to unspecified occlusion or stenosis of right anterior cerebral artery: Secondary | ICD-10-CM | POA: Diagnosis not present

## 2022-09-02 DIAGNOSIS — R2689 Other abnormalities of gait and mobility: Secondary | ICD-10-CM | POA: Diagnosis not present

## 2022-09-02 DIAGNOSIS — M6281 Muscle weakness (generalized): Secondary | ICD-10-CM | POA: Diagnosis not present

## 2022-09-02 DIAGNOSIS — R293 Abnormal posture: Secondary | ICD-10-CM | POA: Diagnosis not present

## 2022-09-03 DIAGNOSIS — R293 Abnormal posture: Secondary | ICD-10-CM | POA: Diagnosis not present

## 2022-09-03 DIAGNOSIS — I63521 Cerebral infarction due to unspecified occlusion or stenosis of right anterior cerebral artery: Secondary | ICD-10-CM | POA: Diagnosis not present

## 2022-09-03 DIAGNOSIS — I639 Cerebral infarction, unspecified: Secondary | ICD-10-CM | POA: Diagnosis not present

## 2022-09-03 DIAGNOSIS — M6281 Muscle weakness (generalized): Secondary | ICD-10-CM | POA: Diagnosis not present

## 2022-09-03 DIAGNOSIS — R2689 Other abnormalities of gait and mobility: Secondary | ICD-10-CM | POA: Diagnosis not present

## 2022-09-04 DIAGNOSIS — I63521 Cerebral infarction due to unspecified occlusion or stenosis of right anterior cerebral artery: Secondary | ICD-10-CM | POA: Diagnosis not present

## 2022-09-04 DIAGNOSIS — R2689 Other abnormalities of gait and mobility: Secondary | ICD-10-CM | POA: Diagnosis not present

## 2022-09-04 DIAGNOSIS — I639 Cerebral infarction, unspecified: Secondary | ICD-10-CM | POA: Diagnosis not present

## 2022-09-04 DIAGNOSIS — R293 Abnormal posture: Secondary | ICD-10-CM | POA: Diagnosis not present

## 2022-09-04 DIAGNOSIS — M6281 Muscle weakness (generalized): Secondary | ICD-10-CM | POA: Diagnosis not present

## 2022-09-06 DIAGNOSIS — Z8673 Personal history of transient ischemic attack (TIA), and cerebral infarction without residual deficits: Secondary | ICD-10-CM | POA: Diagnosis not present

## 2022-09-06 DIAGNOSIS — I10 Essential (primary) hypertension: Secondary | ICD-10-CM | POA: Diagnosis not present

## 2022-09-06 DIAGNOSIS — E782 Mixed hyperlipidemia: Secondary | ICD-10-CM | POA: Diagnosis not present

## 2022-09-07 DIAGNOSIS — R293 Abnormal posture: Secondary | ICD-10-CM | POA: Diagnosis not present

## 2022-09-07 DIAGNOSIS — I63521 Cerebral infarction due to unspecified occlusion or stenosis of right anterior cerebral artery: Secondary | ICD-10-CM | POA: Diagnosis not present

## 2022-09-07 DIAGNOSIS — M6281 Muscle weakness (generalized): Secondary | ICD-10-CM | POA: Diagnosis not present

## 2022-09-07 DIAGNOSIS — I639 Cerebral infarction, unspecified: Secondary | ICD-10-CM | POA: Diagnosis not present

## 2022-09-07 DIAGNOSIS — R2689 Other abnormalities of gait and mobility: Secondary | ICD-10-CM | POA: Diagnosis not present

## 2022-09-08 DIAGNOSIS — R293 Abnormal posture: Secondary | ICD-10-CM | POA: Diagnosis not present

## 2022-09-08 DIAGNOSIS — I639 Cerebral infarction, unspecified: Secondary | ICD-10-CM | POA: Diagnosis not present

## 2022-09-08 DIAGNOSIS — M6281 Muscle weakness (generalized): Secondary | ICD-10-CM | POA: Diagnosis not present

## 2022-09-08 DIAGNOSIS — I63521 Cerebral infarction due to unspecified occlusion or stenosis of right anterior cerebral artery: Secondary | ICD-10-CM | POA: Diagnosis not present

## 2022-09-08 DIAGNOSIS — R2689 Other abnormalities of gait and mobility: Secondary | ICD-10-CM | POA: Diagnosis not present

## 2022-09-09 DIAGNOSIS — R2689 Other abnormalities of gait and mobility: Secondary | ICD-10-CM | POA: Diagnosis not present

## 2022-09-09 DIAGNOSIS — I639 Cerebral infarction, unspecified: Secondary | ICD-10-CM | POA: Diagnosis not present

## 2022-09-09 DIAGNOSIS — M6281 Muscle weakness (generalized): Secondary | ICD-10-CM | POA: Diagnosis not present

## 2022-09-09 DIAGNOSIS — R293 Abnormal posture: Secondary | ICD-10-CM | POA: Diagnosis not present

## 2022-09-09 DIAGNOSIS — I63521 Cerebral infarction due to unspecified occlusion or stenosis of right anterior cerebral artery: Secondary | ICD-10-CM | POA: Diagnosis not present

## 2022-09-10 DIAGNOSIS — R2689 Other abnormalities of gait and mobility: Secondary | ICD-10-CM | POA: Diagnosis not present

## 2022-09-10 DIAGNOSIS — R293 Abnormal posture: Secondary | ICD-10-CM | POA: Diagnosis not present

## 2022-09-10 DIAGNOSIS — I63521 Cerebral infarction due to unspecified occlusion or stenosis of right anterior cerebral artery: Secondary | ICD-10-CM | POA: Diagnosis not present

## 2022-09-10 DIAGNOSIS — M6281 Muscle weakness (generalized): Secondary | ICD-10-CM | POA: Diagnosis not present

## 2022-09-10 DIAGNOSIS — I639 Cerebral infarction, unspecified: Secondary | ICD-10-CM | POA: Diagnosis not present

## 2022-09-14 DIAGNOSIS — R2689 Other abnormalities of gait and mobility: Secondary | ICD-10-CM | POA: Diagnosis not present

## 2022-09-14 DIAGNOSIS — I639 Cerebral infarction, unspecified: Secondary | ICD-10-CM | POA: Diagnosis not present

## 2022-09-14 DIAGNOSIS — M6281 Muscle weakness (generalized): Secondary | ICD-10-CM | POA: Diagnosis not present

## 2022-09-15 DIAGNOSIS — M6281 Muscle weakness (generalized): Secondary | ICD-10-CM | POA: Diagnosis not present

## 2022-09-15 DIAGNOSIS — R2689 Other abnormalities of gait and mobility: Secondary | ICD-10-CM | POA: Diagnosis not present

## 2022-09-15 DIAGNOSIS — I639 Cerebral infarction, unspecified: Secondary | ICD-10-CM | POA: Diagnosis not present

## 2022-10-10 DIAGNOSIS — Z8673 Personal history of transient ischemic attack (TIA), and cerebral infarction without residual deficits: Secondary | ICD-10-CM | POA: Diagnosis not present

## 2022-10-10 DIAGNOSIS — E782 Mixed hyperlipidemia: Secondary | ICD-10-CM | POA: Diagnosis not present

## 2022-10-10 DIAGNOSIS — I10 Essential (primary) hypertension: Secondary | ICD-10-CM | POA: Diagnosis not present

## 2022-10-25 DIAGNOSIS — H2513 Age-related nuclear cataract, bilateral: Secondary | ICD-10-CM | POA: Diagnosis not present

## 2022-10-25 DIAGNOSIS — H524 Presbyopia: Secondary | ICD-10-CM | POA: Diagnosis not present

## 2022-10-27 DIAGNOSIS — I739 Peripheral vascular disease, unspecified: Secondary | ICD-10-CM | POA: Diagnosis not present

## 2022-10-27 DIAGNOSIS — L603 Nail dystrophy: Secondary | ICD-10-CM | POA: Diagnosis not present

## 2022-11-05 DIAGNOSIS — I10 Essential (primary) hypertension: Secondary | ICD-10-CM | POA: Diagnosis not present

## 2022-11-05 DIAGNOSIS — E782 Mixed hyperlipidemia: Secondary | ICD-10-CM | POA: Diagnosis not present

## 2022-11-05 DIAGNOSIS — Z8673 Personal history of transient ischemic attack (TIA), and cerebral infarction without residual deficits: Secondary | ICD-10-CM | POA: Diagnosis not present

## 2022-12-07 DIAGNOSIS — I10 Essential (primary) hypertension: Secondary | ICD-10-CM | POA: Diagnosis not present

## 2022-12-07 DIAGNOSIS — Z8673 Personal history of transient ischemic attack (TIA), and cerebral infarction without residual deficits: Secondary | ICD-10-CM | POA: Diagnosis not present

## 2022-12-07 DIAGNOSIS — E782 Mixed hyperlipidemia: Secondary | ICD-10-CM | POA: Diagnosis not present

## 2022-12-09 DIAGNOSIS — M62422 Contracture of muscle, left upper arm: Secondary | ICD-10-CM | POA: Diagnosis not present

## 2022-12-09 DIAGNOSIS — M62522 Muscle wasting and atrophy, not elsewhere classified, left upper arm: Secondary | ICD-10-CM | POA: Diagnosis not present

## 2023-01-05 DIAGNOSIS — I10 Essential (primary) hypertension: Secondary | ICD-10-CM | POA: Diagnosis not present

## 2023-01-05 DIAGNOSIS — E782 Mixed hyperlipidemia: Secondary | ICD-10-CM | POA: Diagnosis not present

## 2023-01-05 DIAGNOSIS — Z8673 Personal history of transient ischemic attack (TIA), and cerebral infarction without residual deficits: Secondary | ICD-10-CM | POA: Diagnosis not present

## 2023-02-02 DIAGNOSIS — Z8673 Personal history of transient ischemic attack (TIA), and cerebral infarction without residual deficits: Secondary | ICD-10-CM | POA: Diagnosis not present

## 2023-02-02 DIAGNOSIS — I10 Essential (primary) hypertension: Secondary | ICD-10-CM | POA: Diagnosis not present

## 2023-02-02 DIAGNOSIS — E782 Mixed hyperlipidemia: Secondary | ICD-10-CM | POA: Diagnosis not present

## 2023-03-08 DIAGNOSIS — Z8673 Personal history of transient ischemic attack (TIA), and cerebral infarction without residual deficits: Secondary | ICD-10-CM | POA: Diagnosis not present

## 2023-03-08 DIAGNOSIS — E782 Mixed hyperlipidemia: Secondary | ICD-10-CM | POA: Diagnosis not present

## 2023-03-08 DIAGNOSIS — I10 Essential (primary) hypertension: Secondary | ICD-10-CM | POA: Diagnosis not present

## 2023-03-09 DIAGNOSIS — M62422 Contracture of muscle, left upper arm: Secondary | ICD-10-CM | POA: Diagnosis not present

## 2023-03-09 DIAGNOSIS — M62521 Muscle wasting and atrophy, not elsewhere classified, right upper arm: Secondary | ICD-10-CM | POA: Diagnosis not present

## 2023-03-09 DIAGNOSIS — M62561 Muscle wasting and atrophy, not elsewhere classified, right lower leg: Secondary | ICD-10-CM | POA: Diagnosis not present

## 2023-03-09 DIAGNOSIS — R2689 Other abnormalities of gait and mobility: Secondary | ICD-10-CM | POA: Diagnosis not present

## 2023-03-10 DIAGNOSIS — M62422 Contracture of muscle, left upper arm: Secondary | ICD-10-CM | POA: Diagnosis not present

## 2023-03-10 DIAGNOSIS — R2689 Other abnormalities of gait and mobility: Secondary | ICD-10-CM | POA: Diagnosis not present

## 2023-03-10 DIAGNOSIS — M62561 Muscle wasting and atrophy, not elsewhere classified, right lower leg: Secondary | ICD-10-CM | POA: Diagnosis not present

## 2023-03-10 DIAGNOSIS — M62521 Muscle wasting and atrophy, not elsewhere classified, right upper arm: Secondary | ICD-10-CM | POA: Diagnosis not present

## 2023-03-12 DIAGNOSIS — M62561 Muscle wasting and atrophy, not elsewhere classified, right lower leg: Secondary | ICD-10-CM | POA: Diagnosis not present

## 2023-03-12 DIAGNOSIS — M62422 Contracture of muscle, left upper arm: Secondary | ICD-10-CM | POA: Diagnosis not present

## 2023-03-12 DIAGNOSIS — M62521 Muscle wasting and atrophy, not elsewhere classified, right upper arm: Secondary | ICD-10-CM | POA: Diagnosis not present

## 2023-03-12 DIAGNOSIS — R2689 Other abnormalities of gait and mobility: Secondary | ICD-10-CM | POA: Diagnosis not present

## 2023-03-14 DIAGNOSIS — L602 Onychogryphosis: Secondary | ICD-10-CM | POA: Diagnosis not present

## 2023-03-14 DIAGNOSIS — L603 Nail dystrophy: Secondary | ICD-10-CM | POA: Diagnosis not present

## 2023-03-14 DIAGNOSIS — I739 Peripheral vascular disease, unspecified: Secondary | ICD-10-CM | POA: Diagnosis not present

## 2023-03-15 DIAGNOSIS — M62561 Muscle wasting and atrophy, not elsewhere classified, right lower leg: Secondary | ICD-10-CM | POA: Diagnosis not present

## 2023-03-15 DIAGNOSIS — M65251 Calcific tendinitis, right thigh: Secondary | ICD-10-CM | POA: Diagnosis not present

## 2023-03-15 DIAGNOSIS — R2689 Other abnormalities of gait and mobility: Secondary | ICD-10-CM | POA: Diagnosis not present

## 2023-03-15 DIAGNOSIS — M62422 Contracture of muscle, left upper arm: Secondary | ICD-10-CM | POA: Diagnosis not present

## 2023-03-16 DIAGNOSIS — M62422 Contracture of muscle, left upper arm: Secondary | ICD-10-CM | POA: Diagnosis not present

## 2023-03-16 DIAGNOSIS — R2689 Other abnormalities of gait and mobility: Secondary | ICD-10-CM | POA: Diagnosis not present

## 2023-03-16 DIAGNOSIS — M65251 Calcific tendinitis, right thigh: Secondary | ICD-10-CM | POA: Diagnosis not present

## 2023-03-16 DIAGNOSIS — M62561 Muscle wasting and atrophy, not elsewhere classified, right lower leg: Secondary | ICD-10-CM | POA: Diagnosis not present

## 2023-03-17 DIAGNOSIS — M65251 Calcific tendinitis, right thigh: Secondary | ICD-10-CM | POA: Diagnosis not present

## 2023-03-17 DIAGNOSIS — M62422 Contracture of muscle, left upper arm: Secondary | ICD-10-CM | POA: Diagnosis not present

## 2023-03-17 DIAGNOSIS — R2689 Other abnormalities of gait and mobility: Secondary | ICD-10-CM | POA: Diagnosis not present

## 2023-03-17 DIAGNOSIS — M62561 Muscle wasting and atrophy, not elsewhere classified, right lower leg: Secondary | ICD-10-CM | POA: Diagnosis not present

## 2023-03-18 DIAGNOSIS — M62422 Contracture of muscle, left upper arm: Secondary | ICD-10-CM | POA: Diagnosis not present

## 2023-03-18 DIAGNOSIS — M62561 Muscle wasting and atrophy, not elsewhere classified, right lower leg: Secondary | ICD-10-CM | POA: Diagnosis not present

## 2023-03-18 DIAGNOSIS — M65251 Calcific tendinitis, right thigh: Secondary | ICD-10-CM | POA: Diagnosis not present

## 2023-03-18 DIAGNOSIS — R2689 Other abnormalities of gait and mobility: Secondary | ICD-10-CM | POA: Diagnosis not present

## 2023-03-19 DIAGNOSIS — R2689 Other abnormalities of gait and mobility: Secondary | ICD-10-CM | POA: Diagnosis not present

## 2023-03-19 DIAGNOSIS — M62422 Contracture of muscle, left upper arm: Secondary | ICD-10-CM | POA: Diagnosis not present

## 2023-03-19 DIAGNOSIS — M62561 Muscle wasting and atrophy, not elsewhere classified, right lower leg: Secondary | ICD-10-CM | POA: Diagnosis not present

## 2023-03-19 DIAGNOSIS — M65251 Calcific tendinitis, right thigh: Secondary | ICD-10-CM | POA: Diagnosis not present

## 2023-03-22 DIAGNOSIS — M65251 Calcific tendinitis, right thigh: Secondary | ICD-10-CM | POA: Diagnosis not present

## 2023-03-22 DIAGNOSIS — M62422 Contracture of muscle, left upper arm: Secondary | ICD-10-CM | POA: Diagnosis not present

## 2023-03-22 DIAGNOSIS — R2689 Other abnormalities of gait and mobility: Secondary | ICD-10-CM | POA: Diagnosis not present

## 2023-03-22 DIAGNOSIS — M62561 Muscle wasting and atrophy, not elsewhere classified, right lower leg: Secondary | ICD-10-CM | POA: Diagnosis not present

## 2023-03-23 DIAGNOSIS — R2689 Other abnormalities of gait and mobility: Secondary | ICD-10-CM | POA: Diagnosis not present

## 2023-03-23 DIAGNOSIS — M62422 Contracture of muscle, left upper arm: Secondary | ICD-10-CM | POA: Diagnosis not present

## 2023-03-23 DIAGNOSIS — M65251 Calcific tendinitis, right thigh: Secondary | ICD-10-CM | POA: Diagnosis not present

## 2023-03-23 DIAGNOSIS — M62561 Muscle wasting and atrophy, not elsewhere classified, right lower leg: Secondary | ICD-10-CM | POA: Diagnosis not present

## 2023-03-24 DIAGNOSIS — M62422 Contracture of muscle, left upper arm: Secondary | ICD-10-CM | POA: Diagnosis not present

## 2023-03-24 DIAGNOSIS — R2689 Other abnormalities of gait and mobility: Secondary | ICD-10-CM | POA: Diagnosis not present

## 2023-03-24 DIAGNOSIS — M65251 Calcific tendinitis, right thigh: Secondary | ICD-10-CM | POA: Diagnosis not present

## 2023-03-24 DIAGNOSIS — M62561 Muscle wasting and atrophy, not elsewhere classified, right lower leg: Secondary | ICD-10-CM | POA: Diagnosis not present

## 2023-03-25 DIAGNOSIS — R2689 Other abnormalities of gait and mobility: Secondary | ICD-10-CM | POA: Diagnosis not present

## 2023-03-25 DIAGNOSIS — M62422 Contracture of muscle, left upper arm: Secondary | ICD-10-CM | POA: Diagnosis not present

## 2023-03-25 DIAGNOSIS — M65251 Calcific tendinitis, right thigh: Secondary | ICD-10-CM | POA: Diagnosis not present

## 2023-03-25 DIAGNOSIS — M62561 Muscle wasting and atrophy, not elsewhere classified, right lower leg: Secondary | ICD-10-CM | POA: Diagnosis not present

## 2023-03-27 DIAGNOSIS — M62422 Contracture of muscle, left upper arm: Secondary | ICD-10-CM | POA: Diagnosis not present

## 2023-03-27 DIAGNOSIS — R2689 Other abnormalities of gait and mobility: Secondary | ICD-10-CM | POA: Diagnosis not present

## 2023-03-27 DIAGNOSIS — M62561 Muscle wasting and atrophy, not elsewhere classified, right lower leg: Secondary | ICD-10-CM | POA: Diagnosis not present

## 2023-03-27 DIAGNOSIS — M65251 Calcific tendinitis, right thigh: Secondary | ICD-10-CM | POA: Diagnosis not present

## 2023-03-28 DIAGNOSIS — M65251 Calcific tendinitis, right thigh: Secondary | ICD-10-CM | POA: Diagnosis not present

## 2023-03-28 DIAGNOSIS — R2689 Other abnormalities of gait and mobility: Secondary | ICD-10-CM | POA: Diagnosis not present

## 2023-03-28 DIAGNOSIS — M62561 Muscle wasting and atrophy, not elsewhere classified, right lower leg: Secondary | ICD-10-CM | POA: Diagnosis not present

## 2023-03-28 DIAGNOSIS — M62422 Contracture of muscle, left upper arm: Secondary | ICD-10-CM | POA: Diagnosis not present

## 2023-03-29 DIAGNOSIS — M62422 Contracture of muscle, left upper arm: Secondary | ICD-10-CM | POA: Diagnosis not present

## 2023-03-29 DIAGNOSIS — R2689 Other abnormalities of gait and mobility: Secondary | ICD-10-CM | POA: Diagnosis not present

## 2023-03-29 DIAGNOSIS — M65251 Calcific tendinitis, right thigh: Secondary | ICD-10-CM | POA: Diagnosis not present

## 2023-03-29 DIAGNOSIS — M62561 Muscle wasting and atrophy, not elsewhere classified, right lower leg: Secondary | ICD-10-CM | POA: Diagnosis not present

## 2023-03-30 DIAGNOSIS — R2689 Other abnormalities of gait and mobility: Secondary | ICD-10-CM | POA: Diagnosis not present

## 2023-03-30 DIAGNOSIS — M62422 Contracture of muscle, left upper arm: Secondary | ICD-10-CM | POA: Diagnosis not present

## 2023-03-30 DIAGNOSIS — M65251 Calcific tendinitis, right thigh: Secondary | ICD-10-CM | POA: Diagnosis not present

## 2023-03-30 DIAGNOSIS — M62561 Muscle wasting and atrophy, not elsewhere classified, right lower leg: Secondary | ICD-10-CM | POA: Diagnosis not present

## 2023-03-31 DIAGNOSIS — M62561 Muscle wasting and atrophy, not elsewhere classified, right lower leg: Secondary | ICD-10-CM | POA: Diagnosis not present

## 2023-03-31 DIAGNOSIS — M62422 Contracture of muscle, left upper arm: Secondary | ICD-10-CM | POA: Diagnosis not present

## 2023-03-31 DIAGNOSIS — M65251 Calcific tendinitis, right thigh: Secondary | ICD-10-CM | POA: Diagnosis not present

## 2023-03-31 DIAGNOSIS — R2689 Other abnormalities of gait and mobility: Secondary | ICD-10-CM | POA: Diagnosis not present

## 2023-04-02 DIAGNOSIS — M65251 Calcific tendinitis, right thigh: Secondary | ICD-10-CM | POA: Diagnosis not present

## 2023-04-02 DIAGNOSIS — M62422 Contracture of muscle, left upper arm: Secondary | ICD-10-CM | POA: Diagnosis not present

## 2023-04-02 DIAGNOSIS — R2689 Other abnormalities of gait and mobility: Secondary | ICD-10-CM | POA: Diagnosis not present

## 2023-04-02 DIAGNOSIS — M62561 Muscle wasting and atrophy, not elsewhere classified, right lower leg: Secondary | ICD-10-CM | POA: Diagnosis not present

## 2023-04-03 DIAGNOSIS — M65251 Calcific tendinitis, right thigh: Secondary | ICD-10-CM | POA: Diagnosis not present

## 2023-04-03 DIAGNOSIS — M62561 Muscle wasting and atrophy, not elsewhere classified, right lower leg: Secondary | ICD-10-CM | POA: Diagnosis not present

## 2023-04-03 DIAGNOSIS — R2689 Other abnormalities of gait and mobility: Secondary | ICD-10-CM | POA: Diagnosis not present

## 2023-04-03 DIAGNOSIS — M62422 Contracture of muscle, left upper arm: Secondary | ICD-10-CM | POA: Diagnosis not present

## 2023-04-04 DIAGNOSIS — M65251 Calcific tendinitis, right thigh: Secondary | ICD-10-CM | POA: Diagnosis not present

## 2023-04-04 DIAGNOSIS — M62561 Muscle wasting and atrophy, not elsewhere classified, right lower leg: Secondary | ICD-10-CM | POA: Diagnosis not present

## 2023-04-04 DIAGNOSIS — M62422 Contracture of muscle, left upper arm: Secondary | ICD-10-CM | POA: Diagnosis not present

## 2023-04-04 DIAGNOSIS — R2689 Other abnormalities of gait and mobility: Secondary | ICD-10-CM | POA: Diagnosis not present

## 2023-04-05 DIAGNOSIS — R2689 Other abnormalities of gait and mobility: Secondary | ICD-10-CM | POA: Diagnosis not present

## 2023-04-05 DIAGNOSIS — M65251 Calcific tendinitis, right thigh: Secondary | ICD-10-CM | POA: Diagnosis not present

## 2023-04-05 DIAGNOSIS — M62422 Contracture of muscle, left upper arm: Secondary | ICD-10-CM | POA: Diagnosis not present

## 2023-04-05 DIAGNOSIS — M62561 Muscle wasting and atrophy, not elsewhere classified, right lower leg: Secondary | ICD-10-CM | POA: Diagnosis not present

## 2023-04-06 DIAGNOSIS — M62422 Contracture of muscle, left upper arm: Secondary | ICD-10-CM | POA: Diagnosis not present

## 2023-04-06 DIAGNOSIS — M65251 Calcific tendinitis, right thigh: Secondary | ICD-10-CM | POA: Diagnosis not present

## 2023-04-06 DIAGNOSIS — M62561 Muscle wasting and atrophy, not elsewhere classified, right lower leg: Secondary | ICD-10-CM | POA: Diagnosis not present

## 2023-04-06 DIAGNOSIS — R2689 Other abnormalities of gait and mobility: Secondary | ICD-10-CM | POA: Diagnosis not present

## 2023-04-07 DIAGNOSIS — M62561 Muscle wasting and atrophy, not elsewhere classified, right lower leg: Secondary | ICD-10-CM | POA: Diagnosis not present

## 2023-04-07 DIAGNOSIS — M65251 Calcific tendinitis, right thigh: Secondary | ICD-10-CM | POA: Diagnosis not present

## 2023-04-07 DIAGNOSIS — M62422 Contracture of muscle, left upper arm: Secondary | ICD-10-CM | POA: Diagnosis not present

## 2023-04-07 DIAGNOSIS — R2689 Other abnormalities of gait and mobility: Secondary | ICD-10-CM | POA: Diagnosis not present

## 2023-04-07 DIAGNOSIS — E782 Mixed hyperlipidemia: Secondary | ICD-10-CM | POA: Diagnosis not present

## 2023-04-07 DIAGNOSIS — Z8673 Personal history of transient ischemic attack (TIA), and cerebral infarction without residual deficits: Secondary | ICD-10-CM | POA: Diagnosis not present

## 2023-04-07 DIAGNOSIS — I10 Essential (primary) hypertension: Secondary | ICD-10-CM | POA: Diagnosis not present

## 2023-04-11 DIAGNOSIS — M65251 Calcific tendinitis, right thigh: Secondary | ICD-10-CM | POA: Diagnosis not present

## 2023-04-11 DIAGNOSIS — M62561 Muscle wasting and atrophy, not elsewhere classified, right lower leg: Secondary | ICD-10-CM | POA: Diagnosis not present

## 2023-04-11 DIAGNOSIS — M62422 Contracture of muscle, left upper arm: Secondary | ICD-10-CM | POA: Diagnosis not present

## 2023-04-11 DIAGNOSIS — R2689 Other abnormalities of gait and mobility: Secondary | ICD-10-CM | POA: Diagnosis not present

## 2023-04-12 DIAGNOSIS — M62561 Muscle wasting and atrophy, not elsewhere classified, right lower leg: Secondary | ICD-10-CM | POA: Diagnosis not present

## 2023-04-12 DIAGNOSIS — R2689 Other abnormalities of gait and mobility: Secondary | ICD-10-CM | POA: Diagnosis not present

## 2023-04-12 DIAGNOSIS — M62422 Contracture of muscle, left upper arm: Secondary | ICD-10-CM | POA: Diagnosis not present

## 2023-04-12 DIAGNOSIS — M65251 Calcific tendinitis, right thigh: Secondary | ICD-10-CM | POA: Diagnosis not present

## 2023-04-13 DIAGNOSIS — M62422 Contracture of muscle, left upper arm: Secondary | ICD-10-CM | POA: Diagnosis not present

## 2023-04-13 DIAGNOSIS — M65251 Calcific tendinitis, right thigh: Secondary | ICD-10-CM | POA: Diagnosis not present

## 2023-04-13 DIAGNOSIS — M62561 Muscle wasting and atrophy, not elsewhere classified, right lower leg: Secondary | ICD-10-CM | POA: Diagnosis not present

## 2023-04-13 DIAGNOSIS — R2689 Other abnormalities of gait and mobility: Secondary | ICD-10-CM | POA: Diagnosis not present

## 2023-04-14 DIAGNOSIS — M62422 Contracture of muscle, left upper arm: Secondary | ICD-10-CM | POA: Diagnosis not present

## 2023-04-14 DIAGNOSIS — M65251 Calcific tendinitis, right thigh: Secondary | ICD-10-CM | POA: Diagnosis not present

## 2023-04-14 DIAGNOSIS — M62561 Muscle wasting and atrophy, not elsewhere classified, right lower leg: Secondary | ICD-10-CM | POA: Diagnosis not present

## 2023-04-14 DIAGNOSIS — R2689 Other abnormalities of gait and mobility: Secondary | ICD-10-CM | POA: Diagnosis not present

## 2023-04-17 DIAGNOSIS — M62561 Muscle wasting and atrophy, not elsewhere classified, right lower leg: Secondary | ICD-10-CM | POA: Diagnosis not present

## 2023-04-17 DIAGNOSIS — R2689 Other abnormalities of gait and mobility: Secondary | ICD-10-CM | POA: Diagnosis not present

## 2023-04-17 DIAGNOSIS — M62422 Contracture of muscle, left upper arm: Secondary | ICD-10-CM | POA: Diagnosis not present

## 2023-04-17 DIAGNOSIS — M62521 Muscle wasting and atrophy, not elsewhere classified, right upper arm: Secondary | ICD-10-CM | POA: Diagnosis not present

## 2023-04-18 DIAGNOSIS — M62561 Muscle wasting and atrophy, not elsewhere classified, right lower leg: Secondary | ICD-10-CM | POA: Diagnosis not present

## 2023-04-18 DIAGNOSIS — M62422 Contracture of muscle, left upper arm: Secondary | ICD-10-CM | POA: Diagnosis not present

## 2023-04-18 DIAGNOSIS — R2689 Other abnormalities of gait and mobility: Secondary | ICD-10-CM | POA: Diagnosis not present

## 2023-04-18 DIAGNOSIS — M62521 Muscle wasting and atrophy, not elsewhere classified, right upper arm: Secondary | ICD-10-CM | POA: Diagnosis not present

## 2023-04-19 DIAGNOSIS — M62521 Muscle wasting and atrophy, not elsewhere classified, right upper arm: Secondary | ICD-10-CM | POA: Diagnosis not present

## 2023-04-19 DIAGNOSIS — M62422 Contracture of muscle, left upper arm: Secondary | ICD-10-CM | POA: Diagnosis not present

## 2023-04-19 DIAGNOSIS — M62561 Muscle wasting and atrophy, not elsewhere classified, right lower leg: Secondary | ICD-10-CM | POA: Diagnosis not present

## 2023-04-19 DIAGNOSIS — R2689 Other abnormalities of gait and mobility: Secondary | ICD-10-CM | POA: Diagnosis not present

## 2023-04-20 DIAGNOSIS — M62422 Contracture of muscle, left upper arm: Secondary | ICD-10-CM | POA: Diagnosis not present

## 2023-04-20 DIAGNOSIS — M62561 Muscle wasting and atrophy, not elsewhere classified, right lower leg: Secondary | ICD-10-CM | POA: Diagnosis not present

## 2023-04-20 DIAGNOSIS — M62521 Muscle wasting and atrophy, not elsewhere classified, right upper arm: Secondary | ICD-10-CM | POA: Diagnosis not present

## 2023-04-20 DIAGNOSIS — R2689 Other abnormalities of gait and mobility: Secondary | ICD-10-CM | POA: Diagnosis not present

## 2023-04-21 DIAGNOSIS — M62521 Muscle wasting and atrophy, not elsewhere classified, right upper arm: Secondary | ICD-10-CM | POA: Diagnosis not present

## 2023-04-21 DIAGNOSIS — M62422 Contracture of muscle, left upper arm: Secondary | ICD-10-CM | POA: Diagnosis not present

## 2023-04-21 DIAGNOSIS — R2689 Other abnormalities of gait and mobility: Secondary | ICD-10-CM | POA: Diagnosis not present

## 2023-04-21 DIAGNOSIS — M62561 Muscle wasting and atrophy, not elsewhere classified, right lower leg: Secondary | ICD-10-CM | POA: Diagnosis not present

## 2023-04-22 DIAGNOSIS — M62561 Muscle wasting and atrophy, not elsewhere classified, right lower leg: Secondary | ICD-10-CM | POA: Diagnosis not present

## 2023-04-22 DIAGNOSIS — R2689 Other abnormalities of gait and mobility: Secondary | ICD-10-CM | POA: Diagnosis not present

## 2023-04-22 DIAGNOSIS — M62422 Contracture of muscle, left upper arm: Secondary | ICD-10-CM | POA: Diagnosis not present

## 2023-04-22 DIAGNOSIS — M62521 Muscle wasting and atrophy, not elsewhere classified, right upper arm: Secondary | ICD-10-CM | POA: Diagnosis not present

## 2023-04-26 DIAGNOSIS — M62561 Muscle wasting and atrophy, not elsewhere classified, right lower leg: Secondary | ICD-10-CM | POA: Diagnosis not present

## 2023-04-26 DIAGNOSIS — R2689 Other abnormalities of gait and mobility: Secondary | ICD-10-CM | POA: Diagnosis not present

## 2023-04-26 DIAGNOSIS — M62521 Muscle wasting and atrophy, not elsewhere classified, right upper arm: Secondary | ICD-10-CM | POA: Diagnosis not present

## 2023-04-26 DIAGNOSIS — M62422 Contracture of muscle, left upper arm: Secondary | ICD-10-CM | POA: Diagnosis not present

## 2023-04-27 DIAGNOSIS — M62422 Contracture of muscle, left upper arm: Secondary | ICD-10-CM | POA: Diagnosis not present

## 2023-04-27 DIAGNOSIS — M62561 Muscle wasting and atrophy, not elsewhere classified, right lower leg: Secondary | ICD-10-CM | POA: Diagnosis not present

## 2023-04-27 DIAGNOSIS — R2689 Other abnormalities of gait and mobility: Secondary | ICD-10-CM | POA: Diagnosis not present

## 2023-04-27 DIAGNOSIS — M62521 Muscle wasting and atrophy, not elsewhere classified, right upper arm: Secondary | ICD-10-CM | POA: Diagnosis not present

## 2023-04-28 DIAGNOSIS — M62521 Muscle wasting and atrophy, not elsewhere classified, right upper arm: Secondary | ICD-10-CM | POA: Diagnosis not present

## 2023-04-28 DIAGNOSIS — R2689 Other abnormalities of gait and mobility: Secondary | ICD-10-CM | POA: Diagnosis not present

## 2023-04-28 DIAGNOSIS — M62422 Contracture of muscle, left upper arm: Secondary | ICD-10-CM | POA: Diagnosis not present

## 2023-04-28 DIAGNOSIS — M62561 Muscle wasting and atrophy, not elsewhere classified, right lower leg: Secondary | ICD-10-CM | POA: Diagnosis not present

## 2023-04-29 DIAGNOSIS — R2689 Other abnormalities of gait and mobility: Secondary | ICD-10-CM | POA: Diagnosis not present

## 2023-04-29 DIAGNOSIS — M62561 Muscle wasting and atrophy, not elsewhere classified, right lower leg: Secondary | ICD-10-CM | POA: Diagnosis not present

## 2023-04-29 DIAGNOSIS — M62521 Muscle wasting and atrophy, not elsewhere classified, right upper arm: Secondary | ICD-10-CM | POA: Diagnosis not present

## 2023-04-29 DIAGNOSIS — M62422 Contracture of muscle, left upper arm: Secondary | ICD-10-CM | POA: Diagnosis not present

## 2023-04-30 DIAGNOSIS — M62561 Muscle wasting and atrophy, not elsewhere classified, right lower leg: Secondary | ICD-10-CM | POA: Diagnosis not present

## 2023-04-30 DIAGNOSIS — M62422 Contracture of muscle, left upper arm: Secondary | ICD-10-CM | POA: Diagnosis not present

## 2023-04-30 DIAGNOSIS — R2689 Other abnormalities of gait and mobility: Secondary | ICD-10-CM | POA: Diagnosis not present

## 2023-04-30 DIAGNOSIS — M62521 Muscle wasting and atrophy, not elsewhere classified, right upper arm: Secondary | ICD-10-CM | POA: Diagnosis not present

## 2023-05-02 DIAGNOSIS — R2689 Other abnormalities of gait and mobility: Secondary | ICD-10-CM | POA: Diagnosis not present

## 2023-05-02 DIAGNOSIS — M62521 Muscle wasting and atrophy, not elsewhere classified, right upper arm: Secondary | ICD-10-CM | POA: Diagnosis not present

## 2023-05-02 DIAGNOSIS — M62422 Contracture of muscle, left upper arm: Secondary | ICD-10-CM | POA: Diagnosis not present

## 2023-05-02 DIAGNOSIS — M62561 Muscle wasting and atrophy, not elsewhere classified, right lower leg: Secondary | ICD-10-CM | POA: Diagnosis not present

## 2023-05-03 DIAGNOSIS — M62521 Muscle wasting and atrophy, not elsewhere classified, right upper arm: Secondary | ICD-10-CM | POA: Diagnosis not present

## 2023-05-03 DIAGNOSIS — M62561 Muscle wasting and atrophy, not elsewhere classified, right lower leg: Secondary | ICD-10-CM | POA: Diagnosis not present

## 2023-05-03 DIAGNOSIS — M62422 Contracture of muscle, left upper arm: Secondary | ICD-10-CM | POA: Diagnosis not present

## 2023-05-03 DIAGNOSIS — R2689 Other abnormalities of gait and mobility: Secondary | ICD-10-CM | POA: Diagnosis not present

## 2023-05-04 DIAGNOSIS — M62521 Muscle wasting and atrophy, not elsewhere classified, right upper arm: Secondary | ICD-10-CM | POA: Diagnosis not present

## 2023-05-04 DIAGNOSIS — R2689 Other abnormalities of gait and mobility: Secondary | ICD-10-CM | POA: Diagnosis not present

## 2023-05-04 DIAGNOSIS — M62561 Muscle wasting and atrophy, not elsewhere classified, right lower leg: Secondary | ICD-10-CM | POA: Diagnosis not present

## 2023-05-04 DIAGNOSIS — M62422 Contracture of muscle, left upper arm: Secondary | ICD-10-CM | POA: Diagnosis not present

## 2023-05-05 DIAGNOSIS — M62422 Contracture of muscle, left upper arm: Secondary | ICD-10-CM | POA: Diagnosis not present

## 2023-05-05 DIAGNOSIS — R2689 Other abnormalities of gait and mobility: Secondary | ICD-10-CM | POA: Diagnosis not present

## 2023-05-05 DIAGNOSIS — M62561 Muscle wasting and atrophy, not elsewhere classified, right lower leg: Secondary | ICD-10-CM | POA: Diagnosis not present

## 2023-05-05 DIAGNOSIS — M62521 Muscle wasting and atrophy, not elsewhere classified, right upper arm: Secondary | ICD-10-CM | POA: Diagnosis not present

## 2023-05-06 DIAGNOSIS — M62521 Muscle wasting and atrophy, not elsewhere classified, right upper arm: Secondary | ICD-10-CM | POA: Diagnosis not present

## 2023-05-06 DIAGNOSIS — M62561 Muscle wasting and atrophy, not elsewhere classified, right lower leg: Secondary | ICD-10-CM | POA: Diagnosis not present

## 2023-05-06 DIAGNOSIS — R2689 Other abnormalities of gait and mobility: Secondary | ICD-10-CM | POA: Diagnosis not present

## 2023-05-06 DIAGNOSIS — M62422 Contracture of muscle, left upper arm: Secondary | ICD-10-CM | POA: Diagnosis not present

## 2023-05-08 DIAGNOSIS — Z8673 Personal history of transient ischemic attack (TIA), and cerebral infarction without residual deficits: Secondary | ICD-10-CM | POA: Diagnosis not present

## 2023-05-08 DIAGNOSIS — I10 Essential (primary) hypertension: Secondary | ICD-10-CM | POA: Diagnosis not present

## 2023-05-08 DIAGNOSIS — E782 Mixed hyperlipidemia: Secondary | ICD-10-CM | POA: Diagnosis not present

## 2023-05-09 DIAGNOSIS — M62521 Muscle wasting and atrophy, not elsewhere classified, right upper arm: Secondary | ICD-10-CM | POA: Diagnosis not present

## 2023-05-09 DIAGNOSIS — R2689 Other abnormalities of gait and mobility: Secondary | ICD-10-CM | POA: Diagnosis not present

## 2023-05-09 DIAGNOSIS — M62422 Contracture of muscle, left upper arm: Secondary | ICD-10-CM | POA: Diagnosis not present

## 2023-05-09 DIAGNOSIS — M62561 Muscle wasting and atrophy, not elsewhere classified, right lower leg: Secondary | ICD-10-CM | POA: Diagnosis not present

## 2023-05-10 DIAGNOSIS — M62561 Muscle wasting and atrophy, not elsewhere classified, right lower leg: Secondary | ICD-10-CM | POA: Diagnosis not present

## 2023-05-10 DIAGNOSIS — M62521 Muscle wasting and atrophy, not elsewhere classified, right upper arm: Secondary | ICD-10-CM | POA: Diagnosis not present

## 2023-05-10 DIAGNOSIS — R2689 Other abnormalities of gait and mobility: Secondary | ICD-10-CM | POA: Diagnosis not present

## 2023-05-10 DIAGNOSIS — M62422 Contracture of muscle, left upper arm: Secondary | ICD-10-CM | POA: Diagnosis not present

## 2023-05-16 DIAGNOSIS — M62422 Contracture of muscle, left upper arm: Secondary | ICD-10-CM | POA: Diagnosis not present

## 2023-05-16 DIAGNOSIS — M62561 Muscle wasting and atrophy, not elsewhere classified, right lower leg: Secondary | ICD-10-CM | POA: Diagnosis not present

## 2023-05-16 DIAGNOSIS — R2689 Other abnormalities of gait and mobility: Secondary | ICD-10-CM | POA: Diagnosis not present

## 2023-05-16 DIAGNOSIS — M62521 Muscle wasting and atrophy, not elsewhere classified, right upper arm: Secondary | ICD-10-CM | POA: Diagnosis not present

## 2023-05-17 DIAGNOSIS — M62561 Muscle wasting and atrophy, not elsewhere classified, right lower leg: Secondary | ICD-10-CM | POA: Diagnosis not present

## 2023-05-17 DIAGNOSIS — M62521 Muscle wasting and atrophy, not elsewhere classified, right upper arm: Secondary | ICD-10-CM | POA: Diagnosis not present

## 2023-05-17 DIAGNOSIS — R2689 Other abnormalities of gait and mobility: Secondary | ICD-10-CM | POA: Diagnosis not present

## 2023-05-17 DIAGNOSIS — M62422 Contracture of muscle, left upper arm: Secondary | ICD-10-CM | POA: Diagnosis not present

## 2023-05-18 DIAGNOSIS — M62422 Contracture of muscle, left upper arm: Secondary | ICD-10-CM | POA: Diagnosis not present

## 2023-05-18 DIAGNOSIS — M62521 Muscle wasting and atrophy, not elsewhere classified, right upper arm: Secondary | ICD-10-CM | POA: Diagnosis not present

## 2023-05-18 DIAGNOSIS — M62561 Muscle wasting and atrophy, not elsewhere classified, right lower leg: Secondary | ICD-10-CM | POA: Diagnosis not present

## 2023-05-18 DIAGNOSIS — R2689 Other abnormalities of gait and mobility: Secondary | ICD-10-CM | POA: Diagnosis not present

## 2023-05-19 DIAGNOSIS — M62521 Muscle wasting and atrophy, not elsewhere classified, right upper arm: Secondary | ICD-10-CM | POA: Diagnosis not present

## 2023-05-19 DIAGNOSIS — R2689 Other abnormalities of gait and mobility: Secondary | ICD-10-CM | POA: Diagnosis not present

## 2023-05-19 DIAGNOSIS — M62561 Muscle wasting and atrophy, not elsewhere classified, right lower leg: Secondary | ICD-10-CM | POA: Diagnosis not present

## 2023-05-19 DIAGNOSIS — M62422 Contracture of muscle, left upper arm: Secondary | ICD-10-CM | POA: Diagnosis not present

## 2023-05-20 DIAGNOSIS — M62422 Contracture of muscle, left upper arm: Secondary | ICD-10-CM | POA: Diagnosis not present

## 2023-05-20 DIAGNOSIS — M62521 Muscle wasting and atrophy, not elsewhere classified, right upper arm: Secondary | ICD-10-CM | POA: Diagnosis not present

## 2023-05-20 DIAGNOSIS — R2689 Other abnormalities of gait and mobility: Secondary | ICD-10-CM | POA: Diagnosis not present

## 2023-05-20 DIAGNOSIS — M62561 Muscle wasting and atrophy, not elsewhere classified, right lower leg: Secondary | ICD-10-CM | POA: Diagnosis not present

## 2023-05-21 DIAGNOSIS — M62422 Contracture of muscle, left upper arm: Secondary | ICD-10-CM | POA: Diagnosis not present

## 2023-05-21 DIAGNOSIS — M62561 Muscle wasting and atrophy, not elsewhere classified, right lower leg: Secondary | ICD-10-CM | POA: Diagnosis not present

## 2023-05-21 DIAGNOSIS — M62521 Muscle wasting and atrophy, not elsewhere classified, right upper arm: Secondary | ICD-10-CM | POA: Diagnosis not present

## 2023-05-21 DIAGNOSIS — R2689 Other abnormalities of gait and mobility: Secondary | ICD-10-CM | POA: Diagnosis not present

## 2023-05-23 DIAGNOSIS — M62521 Muscle wasting and atrophy, not elsewhere classified, right upper arm: Secondary | ICD-10-CM | POA: Diagnosis not present

## 2023-05-23 DIAGNOSIS — M62561 Muscle wasting and atrophy, not elsewhere classified, right lower leg: Secondary | ICD-10-CM | POA: Diagnosis not present

## 2023-05-23 DIAGNOSIS — R2689 Other abnormalities of gait and mobility: Secondary | ICD-10-CM | POA: Diagnosis not present

## 2023-05-23 DIAGNOSIS — M62422 Contracture of muscle, left upper arm: Secondary | ICD-10-CM | POA: Diagnosis not present

## 2023-05-24 DIAGNOSIS — L603 Nail dystrophy: Secondary | ICD-10-CM | POA: Diagnosis not present

## 2023-05-24 DIAGNOSIS — L602 Onychogryphosis: Secondary | ICD-10-CM | POA: Diagnosis not present

## 2023-05-24 DIAGNOSIS — M62561 Muscle wasting and atrophy, not elsewhere classified, right lower leg: Secondary | ICD-10-CM | POA: Diagnosis not present

## 2023-05-24 DIAGNOSIS — M62422 Contracture of muscle, left upper arm: Secondary | ICD-10-CM | POA: Diagnosis not present

## 2023-05-24 DIAGNOSIS — R2689 Other abnormalities of gait and mobility: Secondary | ICD-10-CM | POA: Diagnosis not present

## 2023-05-24 DIAGNOSIS — I739 Peripheral vascular disease, unspecified: Secondary | ICD-10-CM | POA: Diagnosis not present

## 2023-05-24 DIAGNOSIS — M62521 Muscle wasting and atrophy, not elsewhere classified, right upper arm: Secondary | ICD-10-CM | POA: Diagnosis not present

## 2023-05-25 DIAGNOSIS — M62521 Muscle wasting and atrophy, not elsewhere classified, right upper arm: Secondary | ICD-10-CM | POA: Diagnosis not present

## 2023-05-25 DIAGNOSIS — R2689 Other abnormalities of gait and mobility: Secondary | ICD-10-CM | POA: Diagnosis not present

## 2023-05-25 DIAGNOSIS — M62561 Muscle wasting and atrophy, not elsewhere classified, right lower leg: Secondary | ICD-10-CM | POA: Diagnosis not present

## 2023-05-25 DIAGNOSIS — M62422 Contracture of muscle, left upper arm: Secondary | ICD-10-CM | POA: Diagnosis not present

## 2023-05-26 DIAGNOSIS — R2689 Other abnormalities of gait and mobility: Secondary | ICD-10-CM | POA: Diagnosis not present

## 2023-05-26 DIAGNOSIS — M62561 Muscle wasting and atrophy, not elsewhere classified, right lower leg: Secondary | ICD-10-CM | POA: Diagnosis not present

## 2023-05-26 DIAGNOSIS — M62521 Muscle wasting and atrophy, not elsewhere classified, right upper arm: Secondary | ICD-10-CM | POA: Diagnosis not present

## 2023-05-26 DIAGNOSIS — M62422 Contracture of muscle, left upper arm: Secondary | ICD-10-CM | POA: Diagnosis not present

## 2023-05-27 DIAGNOSIS — M62561 Muscle wasting and atrophy, not elsewhere classified, right lower leg: Secondary | ICD-10-CM | POA: Diagnosis not present

## 2023-05-27 DIAGNOSIS — M62422 Contracture of muscle, left upper arm: Secondary | ICD-10-CM | POA: Diagnosis not present

## 2023-05-27 DIAGNOSIS — M62521 Muscle wasting and atrophy, not elsewhere classified, right upper arm: Secondary | ICD-10-CM | POA: Diagnosis not present

## 2023-05-27 DIAGNOSIS — R2689 Other abnormalities of gait and mobility: Secondary | ICD-10-CM | POA: Diagnosis not present

## 2023-05-28 DIAGNOSIS — M62422 Contracture of muscle, left upper arm: Secondary | ICD-10-CM | POA: Diagnosis not present

## 2023-05-28 DIAGNOSIS — R2689 Other abnormalities of gait and mobility: Secondary | ICD-10-CM | POA: Diagnosis not present

## 2023-05-28 DIAGNOSIS — M62561 Muscle wasting and atrophy, not elsewhere classified, right lower leg: Secondary | ICD-10-CM | POA: Diagnosis not present

## 2023-05-28 DIAGNOSIS — M62521 Muscle wasting and atrophy, not elsewhere classified, right upper arm: Secondary | ICD-10-CM | POA: Diagnosis not present

## 2023-05-29 DIAGNOSIS — M62422 Contracture of muscle, left upper arm: Secondary | ICD-10-CM | POA: Diagnosis not present

## 2023-05-29 DIAGNOSIS — M62561 Muscle wasting and atrophy, not elsewhere classified, right lower leg: Secondary | ICD-10-CM | POA: Diagnosis not present

## 2023-05-29 DIAGNOSIS — M62521 Muscle wasting and atrophy, not elsewhere classified, right upper arm: Secondary | ICD-10-CM | POA: Diagnosis not present

## 2023-05-29 DIAGNOSIS — R2689 Other abnormalities of gait and mobility: Secondary | ICD-10-CM | POA: Diagnosis not present

## 2023-05-30 DIAGNOSIS — M62422 Contracture of muscle, left upper arm: Secondary | ICD-10-CM | POA: Diagnosis not present

## 2023-05-30 DIAGNOSIS — R2689 Other abnormalities of gait and mobility: Secondary | ICD-10-CM | POA: Diagnosis not present

## 2023-05-30 DIAGNOSIS — M62521 Muscle wasting and atrophy, not elsewhere classified, right upper arm: Secondary | ICD-10-CM | POA: Diagnosis not present

## 2023-05-30 DIAGNOSIS — M62561 Muscle wasting and atrophy, not elsewhere classified, right lower leg: Secondary | ICD-10-CM | POA: Diagnosis not present

## 2023-05-31 DIAGNOSIS — M62422 Contracture of muscle, left upper arm: Secondary | ICD-10-CM | POA: Diagnosis not present

## 2023-05-31 DIAGNOSIS — M62521 Muscle wasting and atrophy, not elsewhere classified, right upper arm: Secondary | ICD-10-CM | POA: Diagnosis not present

## 2023-05-31 DIAGNOSIS — M62561 Muscle wasting and atrophy, not elsewhere classified, right lower leg: Secondary | ICD-10-CM | POA: Diagnosis not present

## 2023-05-31 DIAGNOSIS — R2689 Other abnormalities of gait and mobility: Secondary | ICD-10-CM | POA: Diagnosis not present

## 2023-06-01 DIAGNOSIS — M62521 Muscle wasting and atrophy, not elsewhere classified, right upper arm: Secondary | ICD-10-CM | POA: Diagnosis not present

## 2023-06-01 DIAGNOSIS — M62561 Muscle wasting and atrophy, not elsewhere classified, right lower leg: Secondary | ICD-10-CM | POA: Diagnosis not present

## 2023-06-01 DIAGNOSIS — R2689 Other abnormalities of gait and mobility: Secondary | ICD-10-CM | POA: Diagnosis not present

## 2023-06-01 DIAGNOSIS — M62422 Contracture of muscle, left upper arm: Secondary | ICD-10-CM | POA: Diagnosis not present

## 2023-06-02 DIAGNOSIS — M62521 Muscle wasting and atrophy, not elsewhere classified, right upper arm: Secondary | ICD-10-CM | POA: Diagnosis not present

## 2023-06-02 DIAGNOSIS — R2689 Other abnormalities of gait and mobility: Secondary | ICD-10-CM | POA: Diagnosis not present

## 2023-06-02 DIAGNOSIS — M62422 Contracture of muscle, left upper arm: Secondary | ICD-10-CM | POA: Diagnosis not present

## 2023-06-02 DIAGNOSIS — M62561 Muscle wasting and atrophy, not elsewhere classified, right lower leg: Secondary | ICD-10-CM | POA: Diagnosis not present

## 2023-06-03 DIAGNOSIS — M62422 Contracture of muscle, left upper arm: Secondary | ICD-10-CM | POA: Diagnosis not present

## 2023-06-03 DIAGNOSIS — M62561 Muscle wasting and atrophy, not elsewhere classified, right lower leg: Secondary | ICD-10-CM | POA: Diagnosis not present

## 2023-06-03 DIAGNOSIS — R2689 Other abnormalities of gait and mobility: Secondary | ICD-10-CM | POA: Diagnosis not present

## 2023-06-03 DIAGNOSIS — M62521 Muscle wasting and atrophy, not elsewhere classified, right upper arm: Secondary | ICD-10-CM | POA: Diagnosis not present

## 2023-06-04 DIAGNOSIS — Z8673 Personal history of transient ischemic attack (TIA), and cerebral infarction without residual deficits: Secondary | ICD-10-CM | POA: Diagnosis not present

## 2023-06-04 DIAGNOSIS — I10 Essential (primary) hypertension: Secondary | ICD-10-CM | POA: Diagnosis not present

## 2023-06-04 DIAGNOSIS — E782 Mixed hyperlipidemia: Secondary | ICD-10-CM | POA: Diagnosis not present

## 2023-06-06 DIAGNOSIS — M62561 Muscle wasting and atrophy, not elsewhere classified, right lower leg: Secondary | ICD-10-CM | POA: Diagnosis not present

## 2023-06-06 DIAGNOSIS — M62521 Muscle wasting and atrophy, not elsewhere classified, right upper arm: Secondary | ICD-10-CM | POA: Diagnosis not present

## 2023-06-06 DIAGNOSIS — R2689 Other abnormalities of gait and mobility: Secondary | ICD-10-CM | POA: Diagnosis not present

## 2023-06-06 DIAGNOSIS — M62422 Contracture of muscle, left upper arm: Secondary | ICD-10-CM | POA: Diagnosis not present

## 2023-06-07 DIAGNOSIS — M62422 Contracture of muscle, left upper arm: Secondary | ICD-10-CM | POA: Diagnosis not present

## 2023-06-07 DIAGNOSIS — M62561 Muscle wasting and atrophy, not elsewhere classified, right lower leg: Secondary | ICD-10-CM | POA: Diagnosis not present

## 2023-06-07 DIAGNOSIS — M62521 Muscle wasting and atrophy, not elsewhere classified, right upper arm: Secondary | ICD-10-CM | POA: Diagnosis not present

## 2023-06-07 DIAGNOSIS — R2689 Other abnormalities of gait and mobility: Secondary | ICD-10-CM | POA: Diagnosis not present

## 2023-06-09 DIAGNOSIS — M62422 Contracture of muscle, left upper arm: Secondary | ICD-10-CM | POA: Diagnosis not present

## 2023-06-09 DIAGNOSIS — M62561 Muscle wasting and atrophy, not elsewhere classified, right lower leg: Secondary | ICD-10-CM | POA: Diagnosis not present

## 2023-06-09 DIAGNOSIS — M62521 Muscle wasting and atrophy, not elsewhere classified, right upper arm: Secondary | ICD-10-CM | POA: Diagnosis not present

## 2023-06-09 DIAGNOSIS — R2689 Other abnormalities of gait and mobility: Secondary | ICD-10-CM | POA: Diagnosis not present

## 2023-06-13 DIAGNOSIS — M62422 Contracture of muscle, left upper arm: Secondary | ICD-10-CM | POA: Diagnosis not present

## 2023-06-13 DIAGNOSIS — M62561 Muscle wasting and atrophy, not elsewhere classified, right lower leg: Secondary | ICD-10-CM | POA: Diagnosis not present

## 2023-06-13 DIAGNOSIS — M62521 Muscle wasting and atrophy, not elsewhere classified, right upper arm: Secondary | ICD-10-CM | POA: Diagnosis not present

## 2023-06-13 DIAGNOSIS — R2689 Other abnormalities of gait and mobility: Secondary | ICD-10-CM | POA: Diagnosis not present

## 2023-06-16 DIAGNOSIS — M62561 Muscle wasting and atrophy, not elsewhere classified, right lower leg: Secondary | ICD-10-CM | POA: Diagnosis not present

## 2023-06-16 DIAGNOSIS — R293 Abnormal posture: Secondary | ICD-10-CM | POA: Diagnosis not present

## 2023-06-16 DIAGNOSIS — M62562 Muscle wasting and atrophy, not elsewhere classified, left lower leg: Secondary | ICD-10-CM | POA: Diagnosis not present

## 2023-06-17 DIAGNOSIS — R293 Abnormal posture: Secondary | ICD-10-CM | POA: Diagnosis not present

## 2023-06-17 DIAGNOSIS — M62562 Muscle wasting and atrophy, not elsewhere classified, left lower leg: Secondary | ICD-10-CM | POA: Diagnosis not present

## 2023-06-17 DIAGNOSIS — M62561 Muscle wasting and atrophy, not elsewhere classified, right lower leg: Secondary | ICD-10-CM | POA: Diagnosis not present

## 2023-06-18 DIAGNOSIS — M62562 Muscle wasting and atrophy, not elsewhere classified, left lower leg: Secondary | ICD-10-CM | POA: Diagnosis not present

## 2023-06-18 DIAGNOSIS — M62561 Muscle wasting and atrophy, not elsewhere classified, right lower leg: Secondary | ICD-10-CM | POA: Diagnosis not present

## 2023-06-18 DIAGNOSIS — R293 Abnormal posture: Secondary | ICD-10-CM | POA: Diagnosis not present

## 2023-06-20 DIAGNOSIS — R293 Abnormal posture: Secondary | ICD-10-CM | POA: Diagnosis not present

## 2023-06-20 DIAGNOSIS — M62562 Muscle wasting and atrophy, not elsewhere classified, left lower leg: Secondary | ICD-10-CM | POA: Diagnosis not present

## 2023-06-20 DIAGNOSIS — M62561 Muscle wasting and atrophy, not elsewhere classified, right lower leg: Secondary | ICD-10-CM | POA: Diagnosis not present

## 2023-06-21 DIAGNOSIS — M62561 Muscle wasting and atrophy, not elsewhere classified, right lower leg: Secondary | ICD-10-CM | POA: Diagnosis not present

## 2023-06-21 DIAGNOSIS — R293 Abnormal posture: Secondary | ICD-10-CM | POA: Diagnosis not present

## 2023-06-21 DIAGNOSIS — M62562 Muscle wasting and atrophy, not elsewhere classified, left lower leg: Secondary | ICD-10-CM | POA: Diagnosis not present

## 2023-06-22 DIAGNOSIS — M62562 Muscle wasting and atrophy, not elsewhere classified, left lower leg: Secondary | ICD-10-CM | POA: Diagnosis not present

## 2023-06-22 DIAGNOSIS — R293 Abnormal posture: Secondary | ICD-10-CM | POA: Diagnosis not present

## 2023-06-22 DIAGNOSIS — M62561 Muscle wasting and atrophy, not elsewhere classified, right lower leg: Secondary | ICD-10-CM | POA: Diagnosis not present

## 2023-06-23 DIAGNOSIS — R293 Abnormal posture: Secondary | ICD-10-CM | POA: Diagnosis not present

## 2023-06-23 DIAGNOSIS — M62561 Muscle wasting and atrophy, not elsewhere classified, right lower leg: Secondary | ICD-10-CM | POA: Diagnosis not present

## 2023-06-23 DIAGNOSIS — M62562 Muscle wasting and atrophy, not elsewhere classified, left lower leg: Secondary | ICD-10-CM | POA: Diagnosis not present

## 2023-06-24 DIAGNOSIS — M62562 Muscle wasting and atrophy, not elsewhere classified, left lower leg: Secondary | ICD-10-CM | POA: Diagnosis not present

## 2023-06-24 DIAGNOSIS — R293 Abnormal posture: Secondary | ICD-10-CM | POA: Diagnosis not present

## 2023-06-24 DIAGNOSIS — M62561 Muscle wasting and atrophy, not elsewhere classified, right lower leg: Secondary | ICD-10-CM | POA: Diagnosis not present

## 2023-07-06 DIAGNOSIS — Z8673 Personal history of transient ischemic attack (TIA), and cerebral infarction without residual deficits: Secondary | ICD-10-CM | POA: Diagnosis not present

## 2023-07-06 DIAGNOSIS — E782 Mixed hyperlipidemia: Secondary | ICD-10-CM | POA: Diagnosis not present

## 2023-07-06 DIAGNOSIS — I10 Essential (primary) hypertension: Secondary | ICD-10-CM | POA: Diagnosis not present

## 2023-08-03 DIAGNOSIS — Z1152 Encounter for screening for COVID-19: Secondary | ICD-10-CM | POA: Diagnosis not present

## 2023-08-03 DIAGNOSIS — R509 Fever, unspecified: Secondary | ICD-10-CM | POA: Diagnosis not present

## 2023-08-07 DIAGNOSIS — I10 Essential (primary) hypertension: Secondary | ICD-10-CM | POA: Diagnosis not present

## 2023-08-07 DIAGNOSIS — E782 Mixed hyperlipidemia: Secondary | ICD-10-CM | POA: Diagnosis not present

## 2023-08-07 DIAGNOSIS — Z8673 Personal history of transient ischemic attack (TIA), and cerebral infarction without residual deficits: Secondary | ICD-10-CM | POA: Diagnosis not present

## 2023-09-05 DIAGNOSIS — Z8673 Personal history of transient ischemic attack (TIA), and cerebral infarction without residual deficits: Secondary | ICD-10-CM | POA: Diagnosis not present

## 2023-09-05 DIAGNOSIS — I10 Essential (primary) hypertension: Secondary | ICD-10-CM | POA: Diagnosis not present

## 2023-09-05 DIAGNOSIS — E782 Mixed hyperlipidemia: Secondary | ICD-10-CM | POA: Diagnosis not present

## 2023-10-06 DIAGNOSIS — Z8673 Personal history of transient ischemic attack (TIA), and cerebral infarction without residual deficits: Secondary | ICD-10-CM | POA: Diagnosis not present

## 2023-10-06 DIAGNOSIS — E782 Mixed hyperlipidemia: Secondary | ICD-10-CM | POA: Diagnosis not present

## 2023-10-06 DIAGNOSIS — I10 Essential (primary) hypertension: Secondary | ICD-10-CM | POA: Diagnosis not present

## 2023-11-03 DIAGNOSIS — E782 Mixed hyperlipidemia: Secondary | ICD-10-CM | POA: Diagnosis not present

## 2023-11-03 DIAGNOSIS — Z8673 Personal history of transient ischemic attack (TIA), and cerebral infarction without residual deficits: Secondary | ICD-10-CM | POA: Diagnosis not present

## 2023-11-03 DIAGNOSIS — I10 Essential (primary) hypertension: Secondary | ICD-10-CM | POA: Diagnosis not present

## 2023-11-23 DIAGNOSIS — L602 Onychogryphosis: Secondary | ICD-10-CM | POA: Diagnosis not present

## 2023-12-03 DIAGNOSIS — E782 Mixed hyperlipidemia: Secondary | ICD-10-CM | POA: Diagnosis not present

## 2023-12-03 DIAGNOSIS — I10 Essential (primary) hypertension: Secondary | ICD-10-CM | POA: Diagnosis not present

## 2023-12-03 DIAGNOSIS — Z8673 Personal history of transient ischemic attack (TIA), and cerebral infarction without residual deficits: Secondary | ICD-10-CM | POA: Diagnosis not present

## 2024-01-04 DIAGNOSIS — Z8673 Personal history of transient ischemic attack (TIA), and cerebral infarction without residual deficits: Secondary | ICD-10-CM | POA: Diagnosis not present

## 2024-01-04 DIAGNOSIS — I10 Essential (primary) hypertension: Secondary | ICD-10-CM | POA: Diagnosis not present

## 2024-01-04 DIAGNOSIS — E782 Mixed hyperlipidemia: Secondary | ICD-10-CM | POA: Diagnosis not present

## 2024-02-05 DIAGNOSIS — I10 Essential (primary) hypertension: Secondary | ICD-10-CM | POA: Diagnosis not present

## 2024-02-05 DIAGNOSIS — Z8673 Personal history of transient ischemic attack (TIA), and cerebral infarction without residual deficits: Secondary | ICD-10-CM | POA: Diagnosis not present

## 2024-02-05 DIAGNOSIS — E782 Mixed hyperlipidemia: Secondary | ICD-10-CM | POA: Diagnosis not present

## 2024-02-28 DIAGNOSIS — H524 Presbyopia: Secondary | ICD-10-CM | POA: Diagnosis not present

## 2024-02-28 DIAGNOSIS — H2513 Age-related nuclear cataract, bilateral: Secondary | ICD-10-CM | POA: Diagnosis not present

## 2024-03-02 DIAGNOSIS — I739 Peripheral vascular disease, unspecified: Secondary | ICD-10-CM | POA: Diagnosis not present

## 2024-03-02 DIAGNOSIS — L603 Nail dystrophy: Secondary | ICD-10-CM | POA: Diagnosis not present

## 2024-03-02 DIAGNOSIS — L602 Onychogryphosis: Secondary | ICD-10-CM | POA: Diagnosis not present

## 2024-03-04 DIAGNOSIS — Z8673 Personal history of transient ischemic attack (TIA), and cerebral infarction without residual deficits: Secondary | ICD-10-CM | POA: Diagnosis not present

## 2024-03-04 DIAGNOSIS — I10 Essential (primary) hypertension: Secondary | ICD-10-CM | POA: Diagnosis not present

## 2024-03-04 DIAGNOSIS — E782 Mixed hyperlipidemia: Secondary | ICD-10-CM | POA: Diagnosis not present

## 2024-04-03 DIAGNOSIS — Z8673 Personal history of transient ischemic attack (TIA), and cerebral infarction without residual deficits: Secondary | ICD-10-CM | POA: Diagnosis not present

## 2024-04-03 DIAGNOSIS — I10 Essential (primary) hypertension: Secondary | ICD-10-CM | POA: Diagnosis not present

## 2024-04-03 DIAGNOSIS — E782 Mixed hyperlipidemia: Secondary | ICD-10-CM | POA: Diagnosis not present
